# Patient Record
Sex: Male | Born: 1950 | Race: White | Hispanic: No
Health system: Southern US, Academic
[De-identification: ages and names within clinical notes are randomized; demographics above are authoritative.]

## PROBLEM LIST (undated history)

## (undated) ENCOUNTER — Ambulatory Visit: Payer: MEDICARE

## (undated) ENCOUNTER — Ambulatory Visit

## (undated) ENCOUNTER — Encounter
Attending: Student in an Organized Health Care Education/Training Program | Primary: Student in an Organized Health Care Education/Training Program

## (undated) ENCOUNTER — Encounter

## (undated) ENCOUNTER — Encounter: Attending: Geriatric Medicine | Primary: Geriatric Medicine

## (undated) ENCOUNTER — Inpatient Hospital Stay

## (undated) ENCOUNTER — Encounter: Attending: Family Medicine | Primary: Family Medicine

## (undated) ENCOUNTER — Telehealth

## (undated) ENCOUNTER — Encounter
Payer: MEDICARE | Attending: Student in an Organized Health Care Education/Training Program | Primary: Student in an Organized Health Care Education/Training Program

## (undated) ENCOUNTER — Encounter: Attending: Family | Primary: Family

## (undated) DIAGNOSIS — G8929 Other chronic pain: Secondary | ICD-10-CM

## (undated) DIAGNOSIS — J449 Chronic obstructive pulmonary disease, unspecified: Secondary | ICD-10-CM

## (undated) HISTORY — PX: APPENDECTOMY: SHX54

## (undated) HISTORY — PX: OTHER SURGICAL HISTORY: SHX169

## (undated) HISTORY — PX: CERVICAL SPINE SURGERY: SHX589

---

## 2020-06-05 ENCOUNTER — Encounter: Admit: 2020-06-05 | Discharge: 2020-06-06 | Payer: MEDICARE

## 2020-06-15 MED ORDER — PEG-ELECTROLYTE SOLUTION 420 GRAM ORAL SOLUTION
0 refills | 0 days | Status: CP
Start: 2020-06-15 — End: ?

## 2020-06-18 MED FILL — PEG-ELECTROLYTE SOLUTION 420 GRAM ORAL SOLUTION: 1 days supply | Qty: 4000 | Fill #0 | Status: AC

## 2020-06-18 MED FILL — PEG-ELECTROLYTE SOLUTION 420 GRAM ORAL SOLUTION: 1 days supply | Qty: 4000 | Fill #0

## 2020-07-06 ENCOUNTER — Encounter: Admit: 2020-07-06 | Discharge: 2020-07-07 | Payer: MEDICARE

## 2020-07-20 DIAGNOSIS — J441 Chronic obstructive pulmonary disease with (acute) exacerbation: Principal | ICD-10-CM

## 2020-07-23 ENCOUNTER — Encounter
Admit: 2020-07-23 | Discharge: 2020-07-23 | Payer: MEDICARE | Attending: Student in an Organized Health Care Education/Training Program | Primary: Student in an Organized Health Care Education/Training Program

## 2020-07-23 ENCOUNTER — Ambulatory Visit: Admit: 2020-07-23 | Discharge: 2020-07-23 | Payer: MEDICARE

## 2020-07-23 DIAGNOSIS — J441 Chronic obstructive pulmonary disease with (acute) exacerbation: Principal | ICD-10-CM

## 2020-07-23 DIAGNOSIS — R590 Localized enlarged lymph nodes: Principal | ICD-10-CM

## 2020-07-23 MED ORDER — VARENICLINE 0.5 MG TABLET
ORAL_TABLET | ORAL | 0 refills | 0.00000 days | Status: CP
Start: 2020-07-23 — End: ?

## 2020-07-23 MED ORDER — FLUTICASONE FUR. 100 MCG-UMECLID 62.5 MCG-VILANT 25 MCG INHALAT.POWDER
Freq: Every day | RESPIRATORY_TRACT | 11 refills | 28 days | Status: CP
Start: 2020-07-23 — End: ?

## 2020-07-26 MED ORDER — IPRATROPIUM 0.5 MG-ALBUTEROL 3 MG (2.5 MG BASE)/3 ML NEBULIZATION SOLN
Freq: Four times a day (QID) | RESPIRATORY_TRACT | 11 refills | 30.00000 days | Status: CP | PRN
Start: 2020-07-26 — End: 2020-08-25

## 2020-08-01 ENCOUNTER — Encounter: Admit: 2020-08-01 | Discharge: 2020-08-05 | Payer: MEDICARE | Attending: Registered Nurse | Primary: Registered Nurse

## 2020-08-01 ENCOUNTER — Encounter: Admit: 2020-08-01 | Discharge: 2020-08-05 | Payer: MEDICARE

## 2020-09-18 DIAGNOSIS — Z0000 Encounter for general adult medical examination without abnormal findings: Principal | ICD-10-CM

## 2020-09-19 DIAGNOSIS — R590 Localized enlarged lymph nodes: Principal | ICD-10-CM

## 2020-11-15 DIAGNOSIS — Z87891 Personal history of nicotine dependence: Principal | ICD-10-CM

## 2021-01-13 ENCOUNTER — Encounter: Admit: 2021-01-13 | Discharge: 2021-01-14 | Disposition: A | Discharge status: Home / Self Care | Payer: MEDICARE

## 2021-01-13 ENCOUNTER — Emergency Department: Admit: 2021-01-13 | Discharge: 2021-01-14 | Disposition: A | Discharge status: Home / Self Care | Payer: MEDICARE

## 2021-01-13 DIAGNOSIS — R000 Tachycardia, unspecified: Principal | ICD-10-CM

## 2021-01-13 DIAGNOSIS — R42 Dizziness and giddiness: Principal | ICD-10-CM

## 2021-01-13 DIAGNOSIS — J441 Chronic obstructive pulmonary disease with (acute) exacerbation: Principal | ICD-10-CM

## 2021-01-13 DIAGNOSIS — F17210 Nicotine dependence, cigarettes, uncomplicated: Principal | ICD-10-CM

## 2021-01-13 DIAGNOSIS — R0602 Shortness of breath: Principal | ICD-10-CM

## 2021-01-13 DIAGNOSIS — U071 COVID-19: Principal | ICD-10-CM

## 2021-01-13 DIAGNOSIS — Z79899 Other long term (current) drug therapy: Principal | ICD-10-CM

## 2021-01-13 DIAGNOSIS — Z7951 Long term (current) use of inhaled steroids: Principal | ICD-10-CM

## 2021-01-13 DIAGNOSIS — R531 Weakness: Principal | ICD-10-CM

## 2021-01-13 MED ORDER — PREDNISONE 20 MG TABLET
ORAL_TABLET | ORAL | 0 refills | 0.00000 days | Status: CP
Start: 2021-01-13 — End: 2021-01-13

## 2021-04-08 ENCOUNTER — Ambulatory Visit: Admit: 2021-04-08 | Payer: MEDICARE

## 2021-04-10 ENCOUNTER — Emergency Department: Payer: Medicare Other

## 2021-04-10 ENCOUNTER — Emergency Department
Admission: EM | Admit: 2021-04-10 | Discharge: 2021-04-11 | Disposition: A | Discharge status: Home / Self Care | Payer: Medicare Other | Attending: Emergency Medicine | Admitting: Emergency Medicine

## 2021-04-10 DIAGNOSIS — Z20822 Contact with and (suspected) exposure to covid-19: Secondary | ICD-10-CM | POA: Insufficient documentation

## 2021-04-10 DIAGNOSIS — R0902 Hypoxemia: Secondary | ICD-10-CM | POA: Diagnosis not present

## 2021-04-10 DIAGNOSIS — J441 Chronic obstructive pulmonary disease with (acute) exacerbation: Secondary | ICD-10-CM | POA: Diagnosis not present

## 2021-04-10 DIAGNOSIS — R0602 Shortness of breath: Secondary | ICD-10-CM

## 2021-04-10 LAB — CBC WITH DIFFERENTIAL/PLATELET
Abs Immature Granulocytes: 0.07 10*3/uL (ref 0.00–0.07)
Basophils Absolute: 0.1 10*3/uL (ref 0.0–0.1)
Basophils Relative: 0 %
Eosinophils Absolute: 0.1 10*3/uL (ref 0.0–0.5)
Eosinophils Relative: 0 %
HCT: 36.7 % — ABNORMAL LOW (ref 39.0–52.0)
Hemoglobin: 12 g/dL — ABNORMAL LOW (ref 13.0–17.0)
Immature Granulocytes: 0 %
Lymphocytes Relative: 2 %
Lymphs Abs: 0.4 10*3/uL — ABNORMAL LOW (ref 0.7–4.0)
MCH: 28.6 pg (ref 26.0–34.0)
MCHC: 32.7 g/dL (ref 30.0–36.0)
MCV: 87.6 fL (ref 80.0–100.0)
Monocytes Absolute: 0.6 10*3/uL (ref 0.1–1.0)
Monocytes Relative: 4 %
Neutro Abs: 16.4 10*3/uL — ABNORMAL HIGH (ref 1.7–7.7)
Neutrophils Relative %: 94 %
Platelets: 233 10*3/uL (ref 150–400)
RBC: 4.19 MIL/uL — ABNORMAL LOW (ref 4.22–5.81)
RDW: 15.9 % — ABNORMAL HIGH (ref 11.5–15.5)
WBC: 17.6 10*3/uL — ABNORMAL HIGH (ref 4.0–10.5)
nRBC: 0 % (ref 0.0–0.2)

## 2021-04-10 MED ORDER — DOXYCYCLINE HYCLATE 100 MG PO TABS
100.0000 mg | ORAL_TABLET | Freq: Once | ORAL | Status: AC
  Administered 2021-04-11: 100 mg via ORAL
  Filled 2021-04-10: qty 1

## 2021-04-10 MED ORDER — METHYLPREDNISOLONE SODIUM SUCC 125 MG IJ SOLR: 125.0000 mg | Freq: Once | INTRAMUSCULAR | Status: DC

## 2021-04-10 MED ORDER — LACTATED RINGERS IV BOLUS
1000.0000 mL | Freq: Once | INTRAVENOUS | Status: AC
  Administered 2021-04-10: 1000 mL via INTRAVENOUS

## 2021-04-10 MED ORDER — IPRATROPIUM-ALBUTEROL 0.5-2.5 (3) MG/3ML IN SOLN
6.0000 mL | Freq: Once | RESPIRATORY_TRACT | Status: AC
  Administered 2021-04-10: 6 mL via RESPIRATORY_TRACT
  Filled 2021-04-10: qty 3

## 2021-04-10 NOTE — ED Provider Notes (Signed)
[facility] Emergency Department Provider Note ____________________________________________   Event Date/Time   First MD Initiated Contact with Patient 04/10/21 2253     (approximate)  I have reviewed the triage vital signs and the nursing notes.  HISTORY  Chief Complaint Shortness of Breath (SOB x "couple hours"  EMS reports that he told them he had been sob since this afternoon, had taken "a few hits of his inhaler."  125mg  Solumedrol, 5mg  Albuterol inhaler received enroute to ED by EMS.  Patient anxious, EMS states that he didn't become anxious until they gave him albuterol.  )   HPI Frank Calderon is a 70 y.o. malewho presents to the ED for evaluation of SOB  Chart review indicates no hx in our system.  Patient was receiving care through the [facility].  Reports lifelong smoking history for which he has inhalers at home.  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 Lives in [place] and works in a [place].   Patient presents to the ED via EMS from home for evaluation of shortness of breath.  Patient reports increasing shortness of breath over the past few hours since he left work this afternoon. Reports poor sleep last night, but denies this being due to his breathing.  Patient is sleepy, and repetitive with his answers and questions.  Often says "I do not know" and history is limited due to his respiratory status and mentation.  No past medical history on file.  There are no problems to display for this patient.    Prior to Admission medications   Medication Sig Start Date End Date Taking? Authorizing Provider  doxycycline (VIBRA-TABS) 100 MG tablet Take 1 tablet (100 mg total) by mouth 2 (two) times daily for 14 days. 04/11/21 04/25/21 Yes 06/11/21, MD  predniSONE (DELTASONE) 50 MG tablet Take 1 tablet (50 mg total) by mouth daily for 4 days. 04/11/21 04/15/21 Yes 06/11/21, MD    Allergies Patient has no known  allergies.  No family history on file.  Social History    Review of Systems  Difficult to be accurately assessed due to patient's mentation and respiratory status ____________________________________________   PHYSICAL EXAM:  VITAL SIGNS: Vitals:   04/11/21 0304 04/11/21 0346  BP: (!) 115/58 (!) 99/57  Pulse: 89 96  Resp: 15 12  Temp:    SpO2: 95% 97%      Constitutional: Alert and oriented to self, year, location and situation. Pink puffer morphology.  Tripoding and sitting upright with accessory muscle usage. Eyes: Conjunctivae are normal. PERRL. EOMI. Head: Atraumatic. Nose: No congestion/rhinnorhea. Mouth/Throat: Mucous membranes are dry.  Oropharynx non-erythematous. Neck: No stridor. No cervical spine tenderness to palpation. Cardiovascular: Tachycardic rate, regular rhythm. Grossly normal heart sounds.  Good peripheral circulation. Respiratory: Tachypneic to the upper 20s with accessory muscle usage.  Requiring supplemental oxygen via nasal cannula. Gastrointestinal: Soft , nondistended, nontender to palpation. No CVA tenderness. Musculoskeletal: No lower extremity tenderness nor edema.  No joint effusions.  1 cm scabbing lesion to his left lateral upper arm with surrounding induration and erythema consistent with cellulitis.  No fluctuance, laceration or bleeding. Neurologic: No gross focal neurologic deficits are appreciated.  Initially confused, repetitive but improves her mental status standpoint after improvement of his respiratory status. Skin:  Skin is warm, dry.  Erythematous rash with central scabbing lesion to his left upper arm, as noted above Psychiatric: Mood and affect are normal. Speech and behavior are normal.  ____________________________________________   LABS (  all labs ordered are listed, but only abnormal results are displayed)  Labs Reviewed  COMPREHENSIVE METABOLIC PANEL - Abnormal; Notable for the following components:      Result Value    Sodium 134 (*)    Total Bilirubin 1.3 (*)    All other components within normal limits  CBC WITH DIFFERENTIAL/PLATELET - Abnormal; Notable for the following components:   WBC 17.6 (*)    RBC 4.19 (*)    Hemoglobin 12.0 (*)    HCT 36.7 (*)    RDW 15.9 (*)    Neutro Abs 16.4 (*)    Lymphs Abs 0.4 (*)    All other components within normal limits  BLOOD GAS, ARTERIAL - Abnormal; Notable for the following components:   pH, Arterial 7.59 (*)    pCO2 arterial 22 (*)    pO2, Arterial 127 (*)    All other components within normal limits  TROPONIN I (HIGH SENSITIVITY) - Abnormal; Notable for the following components:   Troponin I (High Sensitivity) 20 (*)    All other components within normal limits  TROPONIN I (HIGH SENSITIVITY) - Abnormal; Notable for the following components:   Troponin I (High Sensitivity) 31 (*)    All other components within normal limits  TROPONIN I (HIGH SENSITIVITY) - Abnormal; Notable for the following components:   Troponin I (High Sensitivity) 32 (*)    All other components within normal limits  RESP PANEL BY RT-PCR (FLU A&B, COVID) ARPGX2  MAGNESIUM  LACTIC ACID, PLASMA  BRAIN NATRIURETIC PEPTIDE   ____________________________________________  12 Lead EKG  Sinus tachycardia with sinus arrhythmia, rate of 122 bpm.  Normal axis and intervals.  No evidence of acute ischemia. ____________________________________________  RADIOLOGY  ED MD interpretation: 1 view CXR reviewed by me with increased perihilar infiltrate/opacities  Official radiology report(s): DG Chest Portable 1 View  Result Date: 04/10/2021 CLINICAL DATA:  Shortness of breath. EXAM: PORTABLE CHEST 1 VIEW COMPARISON:  None. FINDINGS: The lungs are mildly hyperinflated. Moderate severity emphysematous lung disease is seen. Mild to moderate severity areas of atelectasis and/or early infiltrate are also seen within the bilateral lung bases. There is no evidence of a pleural effusion or  pneumothorax. The heart size and mediastinal contours are within normal limits. Degenerative changes seen throughout the thoracic spine. IMPRESSION: COPD with mild to moderate severity bibasilar atelectasis and/or early infiltrate. Electronically Signed   By: Aram Candela M.D.   On: 04/10/2021 23:27    ____________________________________________   PROCEDURES and INTERVENTIONS  Procedure(s) performed (including Critical Care):  .1-3 Lead EKG Interpretation Performed by: Delton Prairie, MD Authorized by: Delton Prairie, MD     Interpretation: abnormal     ECG rate:  120   ECG rate assessment: tachycardic     Rhythm: sinus tachycardia     Ectopy: none     Conduction: normal   .Critical Care Performed by: Delton Prairie, MD Authorized by: Delton Prairie, MD   Critical care provider statement:    Critical care time (minutes):  35   Critical care was necessary to treat or prevent imminent or life-threatening deterioration of the following conditions:  Respiratory failure   Critical care was time spent personally by me on the following activities:  Discussions with consultants, evaluation of patient's response to treatment, examination of patient, ordering and performing treatments and interventions, ordering and review of laboratory studies, ordering and review of radiographic studies, pulse oximetry, re-evaluation of patient's condition, obtaining history from patient or surrogate and review of old  charts    Medications  ipratropium-albuterol (DUONEB) 0.5-2.5 (3) MG/3ML nebulizer solution 6 mL (6 mLs Nebulization Given 04/10/21 2322)  lactated ringers bolus 1,000 mL (0 mLs Intravenous Stopped 04/11/21 0051)  doxycycline (VIBRA-TABS) tablet 100 mg (100 mg Oral Given 04/11/21 0036)    ____________________________________________   MDM / ED COURSE   70 year old male with history of COPD presents to the ED short of breath with evidence of exacerbation, initially hypoxic, but ultimately  amenable to outpatient management.  Presents tachycardic, tachypneic and with sats in the upper 80s tripoding with accessory respiratory muscle usage.  He does have a low-grade temperature, reports increased sputum production and has mild perihilar infiltrates on his CXR, concerning for infectious etiology of his symptoms.  Furthermore has what he describes with a tick bite on his left arm with some mild cellulitis around this, but no clear target lesion.  Patient was started on a 14-day course of doxycycline to treat both of these.  Provided breathing treatments and he got steroids prehospital with significant improvement of his clinical picture and normalization of his vital signs.  His EKG is nonischemic and troponins rise slightly on first interval, and become flat.  He has no chest pain and no evidence of ACS.  Patient provided prescriptions prednisone and doxycycline, and reports having his inhalers at home.  We discussed return precautions for the ED prior to discharge   Clinical Course as of 04/11/21 0404  Wed Apr 10, 2021  2342 Reassessed.  Breathing treatment ongoing.  Patient was clinically improved.  Laying back in bed.  Tachycardia improving.  He is more lucid now during our conversation.  Reports a tick bite to his left arm at the site of his erythematous rash.  Reports feeling fevers today.  Reports increased sputum production [DS]  Thu Apr 11, 2021  0144 Reassessed.  Patient reports feeling much better.  We discussed outpatient management with steroids and doxycycline course.  We discussed return precautions for the ED. [DS]  0144 Patient remains off oxygen with normal saturations on room air, no longer tachycardic.  Clinically much improved [DS]  0301 Troponin slightly uptrending despite reassuring clinical picture and lack of chest pain.  Discussed with the nurse and we will obtain a third set to acquire better trend. [DS]  0359 Reassessed.  Patient sleeping comfortably with normal vital  signs on room air.  We discussed his troponin results.  He continues to deny any chest pain.  He is requesting discharge and I think this is reasonable.  We discussed return precautions for the ED and adherence to doxycycline and prednisone course. [DS]    Clinical Course User Index [DS] Delton Prairie, MD    ____________________________________________   FINAL CLINICAL IMPRESSION(S) / ED DIAGNOSES  Final diagnoses:  COPD exacerbation (HCC)  Shortness of breath  Hypoxia     ED Discharge Orders         Ordered    predniSONE (DELTASONE) 50 MG tablet  Daily        04/11/21 0401    doxycycline (VIBRA-TABS) 100 MG tablet  2 times daily        04/11/21 0401           Prabhjot Piscitello Katrinka Blazing   Note:  This document was prepared using Dragon voice recognition software and may include unintentional dictation errors.   Delton Prairie, MD 04/11/21 [phone]

## 2021-04-10 NOTE — ED Triage Notes (Signed)
Patient reports being SOB for a couple hours.  Received 125 Solumedrol and 5mg  Albuterol 5mg  nebs treatment enroute.  91% RA upon arrival, placed on 2L [place], improved to 96%.  EKG obtained.

## 2021-04-11 ENCOUNTER — Other Ambulatory Visit: Payer: Self-pay

## 2021-04-11 DIAGNOSIS — J441 Chronic obstructive pulmonary disease with (acute) exacerbation: Secondary | ICD-10-CM | POA: Diagnosis not present

## 2021-04-11 LAB — BLOOD GAS, ARTERIAL
Acid-Base Excess: 1.2 mmol/L (ref 0.0–2.0)
Bicarbonate: 21.1 mmol/L (ref 20.0–28.0)
O2 Saturation: 99.3 %
Patient temperature: 37
pCO2 arterial: 22 mmHg — ABNORMAL LOW (ref 32.0–48.0)
pH, Arterial: 7.59 — ABNORMAL HIGH (ref 7.350–7.450)
pO2, Arterial: 127 mmHg — ABNORMAL HIGH (ref 83.0–108.0)

## 2021-04-11 LAB — COMPREHENSIVE METABOLIC PANEL
ALT: 11 U/L (ref 0–44)
AST: 21 U/L (ref 15–41)
Albumin: 3.7 g/dL (ref 3.5–5.0)
Alkaline Phosphatase: 74 U/L (ref 38–126)
Anion gap: 12 (ref 5–15)
BUN: 15 mg/dL (ref 8–23)
CO2: 23 mmol/L (ref 22–32)
Calcium: 8.9 mg/dL (ref 8.9–10.3)
Chloride: 99 mmol/L (ref 98–111)
Creatinine, Ser: 0.69 mg/dL (ref 0.61–1.24)
GFR, Estimated: >60 mL/min (ref >60)
Glucose, Bld: 96 mg/dL (ref 70–99)
Potassium: 3.8 mmol/L (ref 3.5–5.1)
Sodium: 134 mmol/L — ABNORMAL LOW (ref 135–145)
Total Bilirubin: 1.3 mg/dL — ABNORMAL HIGH (ref 0.3–1.2)
Total Protein: 8.1 g/dL (ref 6.5–8.1)

## 2021-04-11 LAB — TROPONIN I (HIGH SENSITIVITY)
Troponin I (High Sensitivity): 20 ng/L — ABNORMAL HIGH (ref <18)
Troponin I (High Sensitivity): 31 ng/L — ABNORMAL HIGH (ref <18)
Troponin I (High Sensitivity): 32 ng/L — ABNORMAL HIGH (ref <18)

## 2021-04-11 LAB — LACTIC ACID, PLASMA: Lactic Acid, Venous: 1.5 mmol/L (ref 0.5–1.9)

## 2021-04-11 LAB — RESP PANEL BY RT-PCR (FLU A&B, COVID) ARPGX2
Influenza A by PCR: NEGATIVE
Influenza B by PCR: NEGATIVE
SARS Coronavirus 2 by RT PCR: NEGATIVE

## 2021-04-11 LAB — MAGNESIUM: Magnesium: 1.8 mg/dL (ref 1.7–2.4)

## 2021-04-11 LAB — BRAIN NATRIURETIC PEPTIDE: B Natriuretic Peptide: 75.6 pg/mL (ref 0.0–100.0)

## 2021-04-11 MED ORDER — PREDNISONE 50 MG PO TABS: 50.0000 mg | ORAL_TABLET | Freq: Every day | ORAL | 0 refills | Status: AC

## 2021-04-11 MED ORDER — DOXYCYCLINE HYCLATE 100 MG PO TABS: 100.0000 mg | ORAL_TABLET | Freq: Two times a day (BID) | ORAL | 0 refills | Status: DC

## 2021-04-11 NOTE — ED Notes (Signed)
Hemodialysis completed, pt aox4 breathing remains regular and unlabored. Remain awaiting order clarification from MD [facility] regarding pt HR

## 2021-04-11 NOTE — ED Notes (Signed)
DC reviewed by RN with pt and son in law. Denies questions. Pt ambulated to son in law's car and denies complaints. AO x4, breathing regular and unlabored

## 2021-04-11 NOTE — ED Notes (Signed)
Pt medicated per MAR. WOB decreased, resting comfortably in bed with eyes closed. Awakens to RN light touch to shoulder. Denies concerns at this time. Aox4. Lights dimmed for pt comfort. Urinal placed at bedside

## 2021-04-11 NOTE — ED Notes (Signed)
Assumed care of pt at 2300 - late entry. Pt alert and oriented x4 however easily startled which pt states is not his baseline. Reports he is tired, intermittently requiring extra prompting to answer questions when pt closes his eyes. Family was at bedside. Tick bite reported by to RUE, redness and warmth noted. Breathing is regular however pt had shoulders raised while breathing upon arrival which has since settled.

## 2021-04-11 NOTE — ED Notes (Signed)
Pt up to use urinal ad lib, denies complaints and reports he feels better than he did on arrival. AOx4, breathing regular and unlabored

## 2021-04-11 NOTE — Discharge Instructions (Signed)
As we discussed, you are being discharged with 2 prescriptions. Prednisone steroids to take once daily to help relieve inflammation of your lungs.  Doxycycline antibiotic to take 2 times daily for the next 14 days to treat mild infection in your lungs, as well as the tick bite rash/infection to your left arm. Please finish all 14 days of this medication.  Continue your regular inhalers and other medications.  Return to the ED with any further worsening symptoms despite these medications.

## 2021-04-17 ENCOUNTER — Encounter: Payer: Self-pay | Admitting: Internal Medicine

## 2021-04-17 ENCOUNTER — Other Ambulatory Visit: Payer: Self-pay

## 2021-04-17 ENCOUNTER — Inpatient Hospital Stay
Admission: EM | Admit: 2021-04-17 | Discharge: 2021-04-21 | DRG: 871 | Disposition: A | Discharge status: Home Health | Payer: Medicare Other | Attending: Family Medicine | Admitting: Family Medicine

## 2021-04-17 ENCOUNTER — Emergency Department: Payer: Medicare Other

## 2021-04-17 DIAGNOSIS — R0902 Hypoxemia: Secondary | ICD-10-CM

## 2021-04-17 DIAGNOSIS — R590 Localized enlarged lymph nodes: Secondary | ICD-10-CM | POA: Diagnosis present

## 2021-04-17 DIAGNOSIS — Z7951 Long term (current) use of inhaled steroids: Secondary | ICD-10-CM | POA: Diagnosis not present

## 2021-04-17 DIAGNOSIS — F17200 Nicotine dependence, unspecified, uncomplicated: Secondary | ICD-10-CM | POA: Diagnosis present

## 2021-04-17 DIAGNOSIS — J9601 Acute respiratory failure with hypoxia: Secondary | ICD-10-CM | POA: Diagnosis present

## 2021-04-17 DIAGNOSIS — T380X5A Adverse effect of glucocorticoids and synthetic analogues, initial encounter: Secondary | ICD-10-CM | POA: Diagnosis present

## 2021-04-17 DIAGNOSIS — R0602 Shortness of breath: Secondary | ICD-10-CM

## 2021-04-17 DIAGNOSIS — J189 Pneumonia, unspecified organism: Secondary | ICD-10-CM | POA: Diagnosis present

## 2021-04-17 DIAGNOSIS — J440 Chronic obstructive pulmonary disease with acute lower respiratory infection: Secondary | ICD-10-CM | POA: Diagnosis present

## 2021-04-17 DIAGNOSIS — Z8249 Family history of ischemic heart disease and other diseases of the circulatory system: Secondary | ICD-10-CM | POA: Diagnosis not present

## 2021-04-17 DIAGNOSIS — R000 Tachycardia, unspecified: Secondary | ICD-10-CM | POA: Diagnosis present

## 2021-04-17 DIAGNOSIS — Z20822 Contact with and (suspected) exposure to covid-19: Secondary | ICD-10-CM | POA: Diagnosis present

## 2021-04-17 DIAGNOSIS — J441 Chronic obstructive pulmonary disease with (acute) exacerbation: Secondary | ICD-10-CM | POA: Diagnosis present

## 2021-04-17 DIAGNOSIS — Z7982 Long term (current) use of aspirin: Secondary | ICD-10-CM

## 2021-04-17 DIAGNOSIS — J181 Lobar pneumonia, unspecified organism: Secondary | ICD-10-CM | POA: Diagnosis present

## 2021-04-17 DIAGNOSIS — Z79899 Other long term (current) drug therapy: Secondary | ICD-10-CM | POA: Diagnosis not present

## 2021-04-17 DIAGNOSIS — A419 Sepsis, unspecified organism: Secondary | ICD-10-CM | POA: Diagnosis not present

## 2021-04-17 DIAGNOSIS — E876 Hypokalemia: Secondary | ICD-10-CM | POA: Diagnosis present

## 2021-04-17 DIAGNOSIS — J9600 Acute respiratory failure, unspecified whether with hypoxia or hypercapnia: Secondary | ICD-10-CM | POA: Diagnosis not present

## 2021-04-17 HISTORY — DX: Chronic obstructive pulmonary disease, unspecified: J44.9

## 2021-04-17 LAB — CBC WITH DIFFERENTIAL/PLATELET
Abs Immature Granulocytes: 0.34 10*3/uL — ABNORMAL HIGH (ref 0.00–0.07)
Basophils Absolute: 0.1 10*3/uL (ref 0.0–0.1)
Basophils Relative: 0 %
Eosinophils Absolute: 0 10*3/uL (ref 0.0–0.5)
Eosinophils Relative: 0 %
HCT: 39.6 % (ref 39.0–52.0)
Hemoglobin: 12.9 g/dL — ABNORMAL LOW (ref 13.0–17.0)
Immature Granulocytes: 1 %
Lymphocytes Relative: 4 %
Lymphs Abs: 1 10*3/uL (ref 0.7–4.0)
MCH: 27.9 pg (ref 26.0–34.0)
MCHC: 32.6 g/dL (ref 30.0–36.0)
MCV: 85.5 fL (ref 80.0–100.0)
Monocytes Absolute: 1.3 10*3/uL — ABNORMAL HIGH (ref 0.1–1.0)
Monocytes Relative: 5 %
Neutro Abs: 21.8 10*3/uL — ABNORMAL HIGH (ref 1.7–7.7)
Neutrophils Relative %: 90 %
Platelets: 283 10*3/uL (ref 150–400)
RBC: 4.63 MIL/uL (ref 4.22–5.81)
RDW: 15.8 % — ABNORMAL HIGH (ref 11.5–15.5)
WBC: 24.5 10*3/uL — ABNORMAL HIGH (ref 4.0–10.5)
nRBC: 0 % (ref 0.0–0.2)

## 2021-04-17 LAB — LACTIC ACID, PLASMA
Lactic Acid, Venous: 1.3 mmol/L (ref 0.5–1.9)
Lactic Acid, Venous: 1.6 mmol/L (ref 0.5–1.9)

## 2021-04-17 LAB — COMPREHENSIVE METABOLIC PANEL
ALT: 32 U/L (ref 0–44)
AST: 23 U/L (ref 15–41)
Albumin: 2.8 g/dL — ABNORMAL LOW (ref 3.5–5.0)
Alkaline Phosphatase: 126 U/L (ref 38–126)
Anion gap: 12 (ref 5–15)
BUN: 25 mg/dL — ABNORMAL HIGH (ref 8–23)
CO2: 26 mmol/L (ref 22–32)
Calcium: 8.6 mg/dL — ABNORMAL LOW (ref 8.9–10.3)
Chloride: 97 mmol/L — ABNORMAL LOW (ref 98–111)
Creatinine, Ser: 0.52 mg/dL — ABNORMAL LOW (ref 0.61–1.24)
GFR, Estimated: >60 mL/min (ref >60)
Glucose, Bld: 85 mg/dL (ref 70–99)
Potassium: 3.8 mmol/L (ref 3.5–5.1)
Sodium: 135 mmol/L (ref 135–145)
Total Bilirubin: 1.4 mg/dL — ABNORMAL HIGH (ref 0.3–1.2)
Total Protein: 7.4 g/dL (ref 6.5–8.1)

## 2021-04-17 LAB — BLOOD GAS, VENOUS
Acid-Base Excess: 2.7 mmol/L — ABNORMAL HIGH (ref 0.0–2.0)
Bicarbonate: 27.9 mmol/L (ref 20.0–28.0)
O2 Saturation: 76.8 %
Patient temperature: 37
pCO2, Ven: 44 mmHg (ref 44.0–60.0)
pH, Ven: 7.41 (ref 7.250–7.430)
pO2, Ven: 41 mmHg (ref 32.0–45.0)

## 2021-04-17 LAB — BRAIN NATRIURETIC PEPTIDE: B Natriuretic Peptide: 83.1 pg/mL (ref 0.0–100.0)

## 2021-04-17 LAB — RESP PANEL BY RT-PCR (FLU A&B, COVID) ARPGX2
Influenza A by PCR: NEGATIVE
Influenza B by PCR: NEGATIVE
SARS Coronavirus 2 by RT PCR: NEGATIVE

## 2021-04-17 LAB — TROPONIN I (HIGH SENSITIVITY)
Troponin I (High Sensitivity): 7 ng/L (ref <18)
Troponin I (High Sensitivity): 10 ng/L (ref <18)

## 2021-04-17 LAB — HIV ANTIBODY (ROUTINE TESTING W REFLEX): HIV Screen 4th Generation wRfx: NONREACTIVE

## 2021-04-17 MED ORDER — LACTATED RINGERS IV BOLUS (SEPSIS)
1000.0000 mL | Freq: Once | INTRAVENOUS | Status: AC
  Administered 2021-04-17: 1000 mL via INTRAVENOUS

## 2021-04-17 MED ORDER — VANCOMYCIN HCL IN DEXTROSE 1-5 GM/200ML-% IV SOLN: 1000.0000 mg | Freq: Once | INTRAVENOUS | Status: DC

## 2021-04-17 MED ORDER — SODIUM CHLORIDE 0.45 % IV SOLN: INTRAVENOUS | Status: AC

## 2021-04-17 MED ORDER — ACETAMINOPHEN 325 MG PO TABS
650.0000 mg | ORAL_TABLET | Freq: Four times a day (QID) | ORAL | Status: DC | PRN
  Administered 2021-04-17: 650 mg via ORAL
  Filled 2021-04-17: qty 2

## 2021-04-17 MED ORDER — GUAIFENESIN ER 600 MG PO TB12
1200.0000 mg | ORAL_TABLET | Freq: Two times a day (BID) | ORAL | Status: DC
  Administered 2021-04-17 – 2021-04-21 (×9): 1200 mg via ORAL
  Filled 2021-04-17 (×9): qty 2

## 2021-04-17 MED ORDER — ENOXAPARIN SODIUM 40 MG/0.4ML IJ SOSY
40.0000 mg | PREFILLED_SYRINGE | INTRAMUSCULAR | Status: DC
  Administered 2021-04-17 – 2021-04-21 (×5): 40 mg via SUBCUTANEOUS
  Filled 2021-04-17 (×5): qty 0.4

## 2021-04-17 MED ORDER — IPRATROPIUM-ALBUTEROL 0.5-2.5 (3) MG/3ML IN SOLN: 3.0000 mL | Freq: Four times a day (QID) | RESPIRATORY_TRACT | Status: DC | PRN

## 2021-04-17 MED ORDER — SODIUM CHLORIDE 0.9 % IV SOLN: 2.0000 g | Freq: Once | INTRAVENOUS | Status: DC

## 2021-04-17 MED ORDER — SODIUM CHLORIDE 0.9 % IV SOLN
2.0000 g | INTRAVENOUS | Status: DC
  Administered 2021-04-17 – 2021-04-19 (×3): 2 g via INTRAVENOUS
  Filled 2021-04-17 (×2): qty 2
  Filled 2021-04-17 (×2): qty 20

## 2021-04-17 MED ORDER — SODIUM CHLORIDE 0.9 % IV SOLN
500.0000 mg | INTRAVENOUS | Status: DC
  Administered 2021-04-17 – 2021-04-19 (×3): 500 mg via INTRAVENOUS
  Filled 2021-04-17 (×4): qty 500

## 2021-04-17 MED ORDER — OXYCODONE HCL 5 MG PO TABS: 5.0000 mg | ORAL_TABLET | ORAL | Status: DC | PRN

## 2021-04-17 MED ORDER — MOMETASONE FURO-FORMOTEROL FUM 200-5 MCG/ACT IN AERO
2.0000 | INHALATION_SPRAY | Freq: Two times a day (BID) | RESPIRATORY_TRACT | Status: DC
  Administered 2021-04-17 – 2021-04-21 (×9): 2 via RESPIRATORY_TRACT
  Filled 2021-04-17 (×2): qty 8.8

## 2021-04-17 MED ORDER — LACTATED RINGERS IV SOLN: INTRAVENOUS | Status: AC

## 2021-04-17 NOTE — ED Provider Notes (Signed)
[facility] Emergency Department Provider Note   ____________________________________________   Event Date/Time   First MD Initiated Contact with Patient 04/17/21 [phone]     (approximate)  I have reviewed the triage vital signs and the nursing notes.   HISTORY  Chief Complaint Shortness of Breath (Respiratory distress from home , hx COPD)    HPI Frank Calderon is a 70 y.o. male with a stated past medical history of COPD who presents via EMS from home complaining of shortness of breath.  Patient states that he has been using his home albuterol nebulizer treatments with little effect.  EMS gave patient 2 albuterol/1 DuoNeb/125 IM Solu-Medrol with minimal relief in patient's symptoms.  Patient states that he was seen here for similar symptoms about a week ago and placed on doxycycline and prednisone.  Patient states that he feels he never truly "got all the way better" but slightly improved while taking the steroid medications.  Patient states that once they ended on Sunday he is become worse and worse.  Patient endorses dyspnea on exertion and denies any other exacerbating or relieving factors.  Patient does endorse chest tightness.  Patient currently denies any vision changes, tinnitus, difficulty speaking, facial droop, sore throat, abdominal pain, nausea/vomiting/diarrhea, dysuria, or weakness/numbness/paresthesias in any extremity         No past medical history on file.  There are no problems to display for this patient.     Prior to Admission medications   Medication Sig Start Date End Date Taking? Authorizing Provider  doxycycline (VIBRA-TABS) 100 MG tablet Take 1 tablet (100 mg total) by mouth 2 (two) times daily for 14 days. 04/11/21 04/25/21  Delton Prairie, MD    Allergies Patient has no known allergies.  No family history on file.  Social History    Review of Systems Constitutional: No fever/chills Eyes: No visual changes. ENT: No sore  throat. Cardiovascular: Endorses chest pain. Respiratory: Endorses shortness of breath. Gastrointestinal: No abdominal pain.  No nausea, no vomiting.  No diarrhea. Genitourinary: Negative for dysuria. Musculoskeletal: Negative for acute arthralgias Skin: Negative for rash. Neurological: Negative for headaches, weakness/numbness/paresthesias in any extremity Psychiatric: Negative for suicidal ideation/homicidal ideation   ____________________________________________   PHYSICAL EXAM:  VITAL SIGNS: ED Triage Vitals  Enc Vitals Group     BP 04/17/21 0933 (!) 118/100     Pulse Rate 04/17/21 0933 (!) 114     Resp 04/17/21 0933 16     Temp 04/17/21 0933 99.3 F (37.4 C)     Temp Source 04/17/21 0933 Oral     SpO2 04/17/21 0932 97 %     Weight 04/17/21 0934 136 lb (61.7 kg)     Height 04/17/21 0934 5\' 10"  (1.778 m)     Head Circumference --      Peak Flow --      Pain Score 04/17/21 0934 0     Pain Loc --      Pain Edu? --      Excl. in GC? --    Constitutional: Alert and oriented. Well appearing and in no acute distress. Eyes: Conjunctivae are normal. PERRL. Head: Atraumatic. Nose: No congestion/rhinnorhea. Mouth/Throat: Mucous membranes are moist. Neck: No stridor Cardiovascular: Grossly normal heart sounds.  Good peripheral circulation. Respiratory: Inspiratory and expiratory wheezes over bilateral lung fields with accompanying rhonchi.  Increased respiratory effort.  No retractions. Gastrointestinal: Soft and nontender. No distention. Musculoskeletal: No obvious deformities Neurologic:  Normal speech and language. No gross focal neurologic deficits  are appreciated. Skin:  Skin is warm and dry. No rash noted. Psychiatric: Mood and affect are normal. Speech and behavior are normal.  ____________________________________________   LABS (all labs ordered are listed, but only abnormal results are displayed)  Labs Reviewed  COMPREHENSIVE METABOLIC PANEL - Abnormal; Notable  for the following components:      Result Value   Chloride 97 (*)    BUN 25 (*)    Creatinine, Ser 0.52 (*)    Calcium 8.6 (*)    Albumin 2.8 (*)    Total Bilirubin 1.4 (*)    All other components within normal limits  CBC WITH DIFFERENTIAL/PLATELET - Abnormal; Notable for the following components:   WBC 24.5 (*)    Hemoglobin 12.9 (*)    RDW 15.8 (*)    Neutro Abs 21.8 (*)    Monocytes Absolute 1.3 (*)    Abs Immature Granulocytes 0.34 (*)    All other components within normal limits  BLOOD GAS, VENOUS - Abnormal; Notable for the following components:   Acid-Base Excess 2.7 (*)    All other components within normal limits  CULTURE, BLOOD (SINGLE)  BRAIN NATRIURETIC PEPTIDE  TROPONIN I (HIGH SENSITIVITY)   ____________________________________________  EKG  ED ECG REPORT I, Merwyn Katos, the attending physician, personally viewed and interpreted this ECG.  Date: 04/17/2021 EKG Time: 0949 Rate: 115 Rhythm: Tachycardic sinus rhythm QRS Axis: normal Intervals: normal ST/T Wave abnormalities: normal Narrative Interpretation: no evidence of acute ischemia  ____________________________________________  RADIOLOGY  ED MD interpretation: Single view portable x-ray of the chest shows changes consistent with COPD as well as a new left basilar consolidation consistent with pneumonia  Official radiology report(s): DG Chest Port 1 View  Result Date: 04/17/2021 CLINICAL DATA:  Shortness of breath, cough productive of green sputum, history COPD EXAM: PORTABLE CHEST 1 VIEW COMPARISON:  Portable exam 0948 hours compared to 04/10/2021 FINDINGS: Normal heart size, mediastinal contours, and pulmonary vascularity. Atherosclerotic calcification aorta. Emphysematous and bronchitic changes consistent with COPD. New LEFT basilar consolidation consistent with pneumonia. Minimal chronic atelectasis at RIGHT base. No pleural effusion or pneumothorax. Bones demineralized. IMPRESSION: COPD changes  with new LEFT basilar consolidation consistent with pneumonia . Electronically Signed   By: Ulyses Southward M.D.   On: 04/17/2021 10:16    ____________________________________________   PROCEDURES  Procedure(s) performed (including Critical Care):  .1-3 Lead EKG Interpretation Performed by: Merwyn Katos, MD Authorized by: Merwyn Katos, MD     Interpretation: abnormal     ECG rate:  110   ECG rate assessment: tachycardic     Rhythm: sinus tachycardia     Ectopy: none     Conduction: normal   .Critical Care Performed by: Merwyn Katos, MD Authorized by: Merwyn Katos, MD   Critical care provider statement:    Critical care time (minutes):  37   Critical care time was exclusive of:  Separately billable procedures and treating other patients   Critical care was necessary to treat or prevent imminent or life-threatening deterioration of the following conditions:  Respiratory failure   Critical care was time spent personally by me on the following activities:  Discussions with consultants, evaluation of patient's response to treatment, examination of patient, ordering and performing treatments and interventions, ordering and review of laboratory studies, ordering and review of radiographic studies, pulse oximetry, re-evaluation of patient's condition, obtaining history from patient or surrogate and review of old charts   I assumed direction of critical care for  this patient from another provider in my specialty: no     Care discussed with: admitting provider       ____________________________________________   INITIAL IMPRESSION / ASSESSMENT AND PLAN / ED COURSE  As part of my medical decision making, I reviewed the following data within the electronic MEDICAL RECORD NUMBER Nursing notes reviewed and incorporated, Labs reviewed, EKG interpreted, Old chart reviewed, Radiograph reviewed and Notes from prior ED visits reviewed and incorporated      Presents with shortness of  breath, cough, and malaise concerning for pneumonia.  DDx: PE, COPD exacerbation, Pneumothorax, TB, Atypical ACS, Esophageal Rupture, Toxic Exposure, Foreign Body Airway Obstruction.  Workup: CXR CBC, CMP, lactate, troponin  Given History, Exam, and Workup presentation most consistent with pneumonia.  Findings: Left basilar consolidation consistent with pneumonia seen on chest x-ray  Tx: Cefepime Vancomycin  1032 Reassessment: As patient is continuing to require supplemental oxygenation for acute hypoxic respiratory failure, patient will require admission to the internal medicine service for further evaluation and management  Disposition: Admit      ____________________________________________   FINAL CLINICAL IMPRESSION(S) / ED DIAGNOSES  Final diagnoses:  SOB (shortness of breath)  HCAP (healthcare-associated pneumonia)  Hypoxia     ED Discharge Orders    None       Note:  This document was prepared using Dragon voice recognition software and may include unintentional dictation errors.   Merwyn Katos, MD 04/17/21 [phone]

## 2021-04-17 NOTE — ED Notes (Signed)
Message sent to floor  Nurse  

## 2021-04-17 NOTE — ED Notes (Signed)
Transported requested  

## 2021-04-17 NOTE — Sepsis Progress Note (Signed)
Notified provider of need to order lactic acid. ° °

## 2021-04-17 NOTE — Sepsis Progress Note (Signed)
elink is following this code sepsis 

## 2021-04-17 NOTE — H&P (Addendum)
History and Physical    Frank Calderon HQI:[id number] DOB: 1951-05-09 DOA: 04/17/2021  PCP: [facility]   Patient coming from: Home  I have personally briefly reviewed patient's old medical records in [facility]  Chief Complaint: Shortness of breath  HPI: Frank Calderon is a 70 y.o. male with medical history significant for COPD who presents to the ER for evaluation of shortness of breath.  Patient states that he has had symptoms for about 7 days and was recently seen in the emergency room for evaluation of shortness of breath.  He was treated for acute COPD exacerbation and was discharged home on prednisone and doxycycline.  Patient states that his symptoms initially improved but then got worse over the last couple of days. He complains of shortness of breath at rest associated with a cough productive of greenish colored phlegm as well as left-sided pleuritic chest pain. When EMS arrived he was found to have room air pulse of 85% and was placed on 3 L of oxygen via nasal cannula. He denies having any fever or chills, no sick contacts, no headache, no dizziness, no lightheadedness, no blurred vision, no palpitations, no diaphoresis, no abdominal pain, no nausea, no vomiting, no urinary symptoms, no lower extremity swelling. Venous blood gas 7.4 1/44/41/20 7.9/76.8 Sodium 135, potassium 3.8, chloride 97, bicarb 26, glucose 85, BUN 25, creatinine 0.52, calcium 8.6, alkaline phosphatase 126, albumin 2.8, AST 23, ALT 32, total protein 7.4, total bilirubin 1.4, BNP 83.1, white count 24.5, hemoglobin 12.9, hematocrit 39.6, MCV 85.5, RDW 15.8, platelet count 283 Chest x-ray reviewed by me shows COPD changes with consolidation in the left lower lobe consistent with pneumonia . Respiratory viral panel is pending Twelve-lead EKG reviewed by me shows sinus tachycardia with right axis deviation.   ED Course: Patient is a 70 year old Caucasian male with a history of COPD who  presents to the ER via EMS for evaluation of shortness of breath associated with cough productive of greenish phlegm and left-sided pleuritic chest pain.  Patient was hypoxic in the field with room air pulse oximetry of 85% and is currently on 3 L of oxygen via nasal cannula. Chest x-ray shows a left lower lobe infiltrate.  Patient had failed outpatient treatment with doxycycline and will be admitted to the hospital for further evaluation.  Review of Systems: As per HPI otherwise all other systems reviewed and negative.    Past Medical History:  Diagnosis Date  . COPD (chronic obstructive pulmonary disease) (HCC)       reports that he has quit smoking. He does not have any smokeless tobacco history on file. He reports previous alcohol use. No history on file for drug use.  No Known Allergies  Family History  Problem Relation Age of Onset  . Hypertension Mother       Prior to Admission medications   Medication Sig Start Date End Date Taking? Authorizing Provider  albuterol (VENTOLIN HFA) 108 (90 Base) MCG/ACT inhaler Inhale 2 puffs into the lungs every 6 (six) hours as needed. 07/09/20  Yes [provider]  aspirin 325 MG tablet Take 325 mg by mouth daily as needed. 08/02/19  Yes [provider]  buprenorphine-naloxone (SUBOXONE) 8-2 mg SUBL SL tablet Place 1 tablet under the tongue daily. 04/12/21  Yes [provider]  celecoxib (CELEBREX) 200 MG capsule Take 200 mg by mouth 2 (two) times daily. 12/31/20  Yes [provider]  doxycycline (VIBRA-TABS) 100 MG tablet Take 1 tablet (100 mg total)  by mouth 2 (two) times daily for 14 days. 04/11/21 04/25/21 Yes Delton Prairie, MD  doxycycline (VIBRAMYCIN) 100 MG capsule Take 100 mg by mouth 2 (two) times daily. 04/11/21  Yes [provider]  fluticasone-salmeterol (ADVAIR) 250-50 MCG/ACT AEPB 1 puff in the morning and at bedtime. 07/22/20  Yes [provider]  gabapentin (NEURONTIN) 600 MG tablet Take  600 mg by mouth 3 (three) times daily. 04/08/21  Yes [provider]  ipratropium-albuterol (DUONEB) 0.5-2.5 (3) MG/3ML SOLN Inhale 3 mLs into the lungs every 6 (six) hours as needed. 07/26/20  Yes [provider]  predniSONE (DELTASONE) 20 MG tablet Take 20 mg by mouth. Dose pak 01/14/21  Yes [provider]  TRELEGY ELLIPTA 100-62.5-25 MCG/INH AEPB Inhale 1 puff into the lungs daily. 10/26/20  Yes [provider]    Physical Exam: Vitals:   04/17/21 0932 04/17/21 0933 04/17/21 0934  BP:  (!) 118/100   Pulse:  (!) 114   Resp:  16   Temp:  99.3 F (37.4 C)   TempSrc:  Oral   SpO2: 97% 97%   Weight:   61.7 kg  Height:   5\' 10"  (1.778 m)     Vitals:   04/17/21 0932 04/17/21 0933 04/17/21 0934  BP:  (!) 118/100   Pulse:  (!) 114   Resp:  16   Temp:  99.3 F (37.4 C)   TempSrc:  Oral   SpO2: 97% 97%   Weight:   61.7 kg  Height:   5\' 10"  (1.778 m)      Constitutional: Alert and oriented x 3 . Not in any apparent distress HEENT:      Head: Normocephalic and atraumatic.         Eyes: PERLA, EOMI, Conjunctivae are normal. Sclera is non-icteric.       Mouth/Throat: Mucous membranes are moist.       Neck: Supple with no signs of meningismus. Cardiovascular:  Tachycardic. No murmurs, gallops, or rubs. 2+ symmetrical distal pulses are present . No JVD. No LE edema Respiratory: Respiratory effort normal .  Crackles left base.  Faint wheezes bilaterally,  Gastrointestinal: Soft, non tender, and non distended with positive bowel sounds.  Genitourinary: No CVA tenderness. Musculoskeletal: Nontender with normal range of motion in all extremities. No cyanosis, or erythema of extremities. Neurologic:  Face is symmetric. Moving all extremities. No gross focal neurologic deficits . Skin: Skin is warm, dry.  No rash or ulcers Psychiatric: Mood and affect are normal   Labs on Admission: I have personally reviewed following labs and imaging  studies  CBC: Recent Labs  Lab 04/10/21 2320 04/17/21 0939  WBC 17.6* 24.5*  NEUTROABS 16.4* 21.8*  HGB 12.0* 12.9*  HCT 36.7* 39.6  MCV 87.6 85.5  PLT 233 283   Basic Metabolic Panel: Recent Labs  Lab 04/10/21 2320 04/17/21 0939  NA 134* 135  K 3.8 3.8  CL 99 97*  CO2 23 26  GLUCOSE 96 85  BUN 15 25*  CREATININE 0.69 0.52*  CALCIUM 8.9 8.6*  MG 1.8  --    GFR: Estimated Creatinine Clearance: 77.1 mL/min (A) (by C-G formula based on SCr of 0.52 mg/dL (L)). Liver Function Tests: Recent Labs  Lab 04/10/21 2320 04/17/21 0939  AST 21 23  ALT 11 32  ALKPHOS 74 126  BILITOT 1.3* 1.4*  PROT 8.1 7.4  ALBUMIN 3.7 2.8*   No results for input(s): LIPASE, AMYLASE in the last 168 hours. No results for  input(s): AMMONIA in the last 168 hours. Coagulation Profile: No results for input(s): INR, PROTIME in the last 168 hours. Cardiac Enzymes: No results for input(s): CKTOTAL, CKMB, CKMBINDEX, TROPONINI in the last 168 hours. BNP (last 3 results) No results for input(s): PROBNP in the last 8760 hours. HbA1C: No results for input(s): HGBA1C in the last 72 hours. CBG: No results for input(s): GLUCAP in the last 168 hours. Lipid Profile: No results for input(s): CHOL, HDL, LDLCALC, TRIG, CHOLHDL, LDLDIRECT in the last 72 hours. Thyroid Function Tests: No results for input(s): TSH, T4TOTAL, FREET4, T3FREE, THYROIDAB in the last 72 hours. Anemia Panel: No results for input(s): VITAMINB12, FOLATE, FERRITIN, TIBC, IRON, RETICCTPCT in the last 72 hours. Urine analysis: No results found for: COLORURINE, APPEARANCEUR, LABSPEC, PHURINE, GLUCOSEU, HGBUR, BILIRUBINUR, KETONESUR, PROTEINUR, UROBILINOGEN, NITRITE, LEUKOCYTESUR  Radiological Exams on Admission: DG Chest Port 1 View  Result Date: 04/17/2021 CLINICAL DATA:  Shortness of breath, cough productive of green sputum, history COPD EXAM: PORTABLE CHEST 1 VIEW COMPARISON:  Portable exam 0948 hours compared to 04/10/2021  FINDINGS: Normal heart size, mediastinal contours, and pulmonary vascularity. Atherosclerotic calcification aorta. Emphysematous and bronchitic changes consistent with COPD. New LEFT basilar consolidation consistent with pneumonia. Minimal chronic atelectasis at RIGHT base. No pleural effusion or pneumothorax. Bones demineralized. IMPRESSION: COPD changes with new LEFT basilar consolidation consistent with pneumonia . Electronically Signed   By: Ulyses Southward M.D.   On: 04/17/2021 10:16     Assessment/Plan Principal Problem:   CAP (community acquired pneumonia) Active Problems:   Acute respiratory failure (HCC)   COPD with acute exacerbation (HCC)     Community-acquired pneumonia Patient presents for evaluation of shortness of breath associated with a cough productive of greenish phlegm and left-sided pleuritic chest pain. Chest x-ray shows left lobar pneumonia Patient failed outpatient antibiotic therapy with doxycycline We will place patient on Rocephin and Zithromax Follow-up results of blood cultures    Acute respiratory failure Secondary to community-acquired pneumonia and acute COPD exacerbation Patient had room air pulse oximetry of 85% and is currently on 3 L of oxygen via nasal cannula with pulse oximetry greater than 92% Patient will need to be assessed for home oxygen need prior to discharge    COPD with acute exacerbation Continue scheduled and as needed bronchodilator therapy Continue inhaled steroids  DVT prophylaxis: Lovenox Code Status: full code Family Communication: Greater than 50% of time was spent discussing patient's condition and plan of care with him at the bedside.  All questions and concerns have been addressed.  He verbalizes understanding and agrees with the plan. Disposition Plan: Back to previous home environment Consults called: none Status : At the time of admission, it appears that the appropriate admission status for this patient is  inpatient. This is judged to be reasonable and necessary in order to provide the required intensity of service to ensure the patient's safety given the presenting symptoms, physical exam findings and initial radiographic and laboratory data in the context of their comorbid conditions. Patient requires inpatient status due to high intensity of service, high risk for further deterioration and high frequency of surveillance required.    Frank Shutters MD [facility]     04/17/2021, 11:36 AM

## 2021-04-17 NOTE — Progress Notes (Signed)
CODE SEPSIS - PHARMACY COMMUNICATION  **Broad Spectrum Antibiotics should be administered within 1 hour of Sepsis diagnosis**  Time Code Sepsis Called/Page Received: 1043  Antibiotics Ordered: Azithromycin,Rocephin  Time of 1st antibiotic administration: 1125  Additional action taken by pharmacy: none  If necessary, Name of Provider/Nurse Contacted: n/a    Bettey Costa ,PharmD Clinical Pharmacist  04/17/2021  11:40 AM

## 2021-04-17 NOTE — ED Triage Notes (Signed)
Respiratory Distress from home .   EMS stated they gave him 2 Albuterol / 1 duoNeb / IM solu-med. They also stated he was seen in the ED about a week ago .   Hx- COPD

## 2021-04-17 NOTE — Consult Note (Signed)
PHARMACY -  BRIEF ANTIBIOTIC NOTE   Pharmacy has received consult(s) for Vancomycin and Cefepime from an ED provider.  The patient's profile has been reviewed for ht/wt/allergies/indication/available labs.    One time order(s) placed for Vancomycin 1g IV and Cefepime 2g IV x 1 dose each.  Further antibiotics/pharmacy consults should be ordered by admitting physician if indicated.                       Thank you, Bettey Costa 04/17/2021  10:45 AM

## 2021-04-17 NOTE — ED Notes (Signed)
Informed bed assigned 

## 2021-04-18 LAB — CBC
HCT: 32 % — ABNORMAL LOW (ref 39.0–52.0)
Hemoglobin: 10.6 g/dL — ABNORMAL LOW (ref 13.0–17.0)
MCH: 28.3 pg (ref 26.0–34.0)
MCHC: 33.1 g/dL (ref 30.0–36.0)
MCV: 85.6 fL (ref 80.0–100.0)
Platelets: 240 10*3/uL (ref 150–400)
RBC: 3.74 MIL/uL — ABNORMAL LOW (ref 4.22–5.81)
RDW: 15.6 % — ABNORMAL HIGH (ref 11.5–15.5)
WBC: 16.8 10*3/uL — ABNORMAL HIGH (ref 4.0–10.5)
nRBC: 0 % (ref 0.0–0.2)

## 2021-04-18 LAB — BASIC METABOLIC PANEL
Anion gap: 9 (ref 5–15)
BUN: 15 mg/dL (ref 8–23)
CO2: 26 mmol/L (ref 22–32)
Calcium: 8 mg/dL — ABNORMAL LOW (ref 8.9–10.3)
Chloride: 100 mmol/L (ref 98–111)
Creatinine, Ser: 0.44 mg/dL — ABNORMAL LOW (ref 0.61–1.24)
GFR, Estimated: >60 mL/min (ref >60)
Glucose, Bld: 82 mg/dL (ref 70–99)
Potassium: 3.6 mmol/L (ref 3.5–5.1)
Sodium: 135 mmol/L (ref 135–145)

## 2021-04-18 MED ORDER — BUPRENORPHINE HCL-NALOXONE HCL 8-2 MG SL SUBL
1.0000 | SUBLINGUAL_TABLET | Freq: Every day | SUBLINGUAL | Status: DC
  Administered 2021-04-18: 1 via SUBLINGUAL
  Filled 2021-04-18: qty 1

## 2021-04-18 MED ORDER — FLUTICASONE-UMECLIDIN-VILANT 100-62.5-25 MCG/INH IN AEPB: 1.0000 | INHALATION_SPRAY | Freq: Every day | RESPIRATORY_TRACT | Status: DC

## 2021-04-18 MED ORDER — UMECLIDINIUM BROMIDE 62.5 MCG/INH IN AEPB
1.0000 | INHALATION_SPRAY | Freq: Every day | RESPIRATORY_TRACT | Status: DC
  Administered 2021-04-18 – 2021-04-21 (×4): 1 via RESPIRATORY_TRACT
  Filled 2021-04-18: qty 7

## 2021-04-18 MED ORDER — ASPIRIN 325 MG PO TABS
325.0000 mg | ORAL_TABLET | Freq: Every day | ORAL | Status: DC
  Administered 2021-04-18 – 2021-04-21 (×4): 325 mg via ORAL
  Filled 2021-04-18 (×4): qty 1

## 2021-04-18 MED ORDER — SODIUM CHLORIDE 0.9 % IV SOLN: INTRAVENOUS | Status: DC

## 2021-04-18 MED ORDER — FLUTICASONE FUROATE-VILANTEROL 200-25 MCG/INH IN AEPB
1.0000 | INHALATION_SPRAY | Freq: Every day | RESPIRATORY_TRACT | Status: DC
  Filled 2021-04-18: qty 28

## 2021-04-18 MED ORDER — IBUPROFEN 400 MG PO TABS
400.0000 mg | ORAL_TABLET | Freq: Three times a day (TID) | ORAL | Status: DC
  Administered 2021-04-18 – 2021-04-21 (×10): 400 mg via ORAL
  Filled 2021-04-18 (×10): qty 1

## 2021-04-18 MED ORDER — BUPRENORPHINE HCL-NALOXONE HCL 8-2 MG SL SUBL
1.0000 | SUBLINGUAL_TABLET | Freq: Three times a day (TID) | SUBLINGUAL | Status: DC
  Administered 2021-04-18 – 2021-04-21 (×9): 1 via SUBLINGUAL
  Filled 2021-04-18 (×9): qty 1

## 2021-04-18 MED ORDER — IPRATROPIUM-ALBUTEROL 0.5-2.5 (3) MG/3ML IN SOLN
3.0000 mL | RESPIRATORY_TRACT | Status: DC
  Administered 2021-04-18 – 2021-04-21 (×19): 3 mL via RESPIRATORY_TRACT
  Filled 2021-04-18 (×18): qty 3

## 2021-04-18 MED ORDER — GABAPENTIN 600 MG PO TABS
600.0000 mg | ORAL_TABLET | Freq: Three times a day (TID) | ORAL | Status: DC
  Administered 2021-04-18 – 2021-04-21 (×10): 600 mg via ORAL
  Filled 2021-04-18 (×10): qty 1

## 2021-04-18 NOTE — Hospital Course (Signed)
70 year old veteran community dwelling Severe emphysema (lifelong smoker) On Suboxone COPD Mediastinal/right hilar lymphadenopathy along with RUL and LUL subcentimeter lymph nodes at [facility] in the past Recent Rx [facility] 01/13/2021-at that time had taken an Rx of azithromycin and steroids found to be tachypneic-patient ultimately was sent home Represented 04/10/2021 with significant SOB Rx steroids doxycycline on discharge from there in addition  Called EMS 5/11-was given albuterol DuoNeb Solu-Medrol-worsened again on 5/8-DOE-PND-new oxygen requirement CXR = left basilar consolidation consistent with pneumonia Rx cefepime vancomycin  BUN/creatinine/0.5-->1 25 5/0.4 WBC 24-->16 Lactic acid 1.3-->1.6

## 2021-04-18 NOTE — Progress Notes (Signed)
PROGRESS NOTE   Frank Calderon  MEQ:[id number] DOB: 08/16/1951 DOA: 04/17/2021 PCP: [facility]  Brief Narrative:  70 year old veteran community dwelling Severe emphysema (lifelong smoker) On Suboxone COPD Mediastinal/right hilar lymphadenopathy along with RUL and LUL subcentimeter lymph nodes at [facility] in the past Recent Rx [facility] 01/13/2021-at that time had taken an Rx of azithromycin and steroids found to be tachypneic-patient ultimately was sent home Represented 04/10/2021 with significant SOB Rx steroids doxycycline on discharge from there in addition  Called EMS 5/11-was given albuterol DuoNeb Solu-Medrol-worsened again on 5/8-DOE-PND-new oxygen requirement CXR = left basilar consolidation consistent with pneumonia Rx cefepime vancomycin, T-max 102, blood pressure 94 systolic, lactic acid slightly elevated    Hospital-Problem based course  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  received fluid bolus in the ED- Continue saline 75 cc/H Acute hypoxic respiratory failure Patient will need a desat screen on discharge as he typically does not use oxygen pain on Suboxone [facility] years ago and uses it for subacute pain as he did not wish to use more addictive opiates Resume Suboxone Add ibuprofen if as needed Leukocytosis Component probably secondary to recent steroid use Monitor trends  DVT prophylaxis: Lovenox Code Status: Full Family Communication: None Disposition:  Status is: Inpatient  Remains inpatient appropriate because:Hemodynamically unstable and IV treatments  appropriate due to intensity of illness or inability to take PO   Dispo: The patient is from: Home              Anticipated d/c is to: Home              Patient currently is not medically stable to d/c.   Difficult to place patient No       Consultants:   None  Procedures: None  Antimicrobials: Ceftriaxone azithromycin   Subjective:  Doing well sitting up in the bed Tells me when he ambulated earlier this morning he got quite winded when he took off the oxygen and went to the sink He has no chest pain He has no sputum   Objective: Vitals:   04/18/21 0407 04/18/21 0845 04/18/21 1107 04/18/21 1130  BP: 102/61 97/60 (!) 106/56   Pulse: 78 92 95   Resp: 16  18   Temp: 98.5 F (36.9 C) 97.9 F (36.6 C) 97.6 F (36.4 C)   TempSrc: Oral Oral Oral   SpO2: 99% 96% 97% 95%  Weight:      Height:        Intake/Output Summary (Last 24 hours) at 04/18/2021 1209 Last data filed at 04/18/2021 0533 Gross per 24 hour  Intake 465.27 ml  Output 725 ml  Net -259.73 ml   Filed Weights   04/17/21 0934  Weight: 61.7 kg    Examination:  Well-groomed nourished white male no distress moderate dentition No lymphadenopathy Chest is clear anteriorly however posterolaterally he has rales No egophony no fremitus Abdomen is soft No lower extremity edema  Data Reviewed: personally reviewed   BUN/creatinine/0.5-->1 25 5/0.4 WBC 24-->16 Lactic acid 1.3-->1.6  Radiology Studies: DG Chest Port 1 View  Result Date: 04/17/2021 CLINICAL DATA:  Shortness of breath, cough productive of green sputum, history COPD EXAM: PORTABLE CHEST 1  VIEW COMPARISON:  Portable exam 0948 hours compared to 04/10/2021 FINDINGS: Normal heart size, mediastinal contours, and pulmonary vascularity. Atherosclerotic calcification aorta. Emphysematous and bronchitic changes consistent with COPD. New LEFT basilar consolidation consistent with pneumonia. Minimal chronic atelectasis at RIGHT base. No pleural  effusion or pneumothorax. Bones demineralized. IMPRESSION: COPD changes with new LEFT basilar consolidation consistent with pneumonia . Electronically Signed   By: Ulyses Southward M.D.   On: 04/17/2021 10:16     Scheduled Meds: . aspirin  325 mg Oral Daily  . buprenorphine-naloxone  1 tablet Sublingual Daily  . enoxaparin (LOVENOX) injection  40 mg Subcutaneous Q24H  . gabapentin  600 mg Oral TID  . guaiFENesin  1,200 mg Oral BID  . ibuprofen  400 mg Oral TID  . ipratropium-albuterol  3 mL Inhalation Q4H  . mometasone-formoterol  2 puff Inhalation BID  . umeclidinium bromide  1 puff Inhalation Daily   Continuous Infusions: . azithromycin Stopped (04/17/21 1223)  . cefTRIAXone (ROCEPHIN)  IV Stopped (04/17/21 1156)     LOS: 1 day   Time spent: 28 minutes  Rhetta Mura, MD [facility] To contact the attending provider between 7A-7P or the covering provider during after hours 7P-7A, please log into the web site [email or web address] and access using universal [place] password for that web site. If you do not have the password, please call the hospital operator.  04/18/2021, 12:09 PM

## 2021-04-19 LAB — CBC WITH DIFFERENTIAL/PLATELET
Abs Immature Granulocytes: 0.23 10*3/uL — ABNORMAL HIGH (ref 0.00–0.07)
Basophils Absolute: 0 10*3/uL (ref 0.0–0.1)
Basophils Relative: 0 %
Eosinophils Absolute: 0.1 10*3/uL (ref 0.0–0.5)
Eosinophils Relative: 1 %
HCT: 31 % — ABNORMAL LOW (ref 39.0–52.0)
Hemoglobin: 10.2 g/dL — ABNORMAL LOW (ref 13.0–17.0)
Immature Granulocytes: 1 %
Lymphocytes Relative: 8 %
Lymphs Abs: 1.3 10*3/uL (ref 0.7–4.0)
MCH: 28.4 pg (ref 26.0–34.0)
MCHC: 32.9 g/dL (ref 30.0–36.0)
MCV: 86.4 fL (ref 80.0–100.0)
Monocytes Absolute: 0.8 10*3/uL (ref 0.1–1.0)
Monocytes Relative: 5 %
Neutro Abs: 14.7 10*3/uL — ABNORMAL HIGH (ref 1.7–7.7)
Neutrophils Relative %: 85 %
Platelets: 253 10*3/uL (ref 150–400)
RBC: 3.59 MIL/uL — ABNORMAL LOW (ref 4.22–5.81)
RDW: 15.9 % — ABNORMAL HIGH (ref 11.5–15.5)
WBC: 17.2 10*3/uL — ABNORMAL HIGH (ref 4.0–10.5)
nRBC: 0 % (ref 0.0–0.2)

## 2021-04-19 LAB — BASIC METABOLIC PANEL
Anion gap: 7 (ref 5–15)
BUN: 10 mg/dL (ref 8–23)
CO2: 27 mmol/L (ref 22–32)
Calcium: 7.8 mg/dL — ABNORMAL LOW (ref 8.9–10.3)
Chloride: 103 mmol/L (ref 98–111)
Creatinine, Ser: 0.52 mg/dL — ABNORMAL LOW (ref 0.61–1.24)
GFR, Estimated: >60 mL/min (ref >60)
Glucose, Bld: 102 mg/dL — ABNORMAL HIGH (ref 70–99)
Potassium: 3.3 mmol/L — ABNORMAL LOW (ref 3.5–5.1)
Sodium: 137 mmol/L (ref 135–145)

## 2021-04-19 LAB — EXPECTORATED SPUTUM ASSESSMENT W GRAM STAIN, RFLX TO RESP C

## 2021-04-19 MED ORDER — POTASSIUM CHLORIDE CRYS ER 20 MEQ PO TBCR
40.0000 meq | EXTENDED_RELEASE_TABLET | Freq: Two times a day (BID) | ORAL | Status: DC
  Administered 2021-04-19 – 2021-04-21 (×5): 40 meq via ORAL
  Filled 2021-04-19 (×5): qty 2

## 2021-04-19 NOTE — Progress Notes (Signed)
PROGRESS NOTE   Frank Calderon  LZJ:[id number] DOB: 1951-04-24 DOA: 04/17/2021 PCP: [facility]  Brief Narrative:  70 year old veteran community dwelling Severe emphysema (lifelong smoker) On Suboxone COPD Mediastinal/right hilar lymphadenopathy along with RUL and LUL subcentimeter lymph nodes at [facility] in the past Recent Rx [facility] 01/13/2021-at that time had taken an Rx of azithromycin and steroids found to be tachypneic-patient ultimately was sent home Represented 04/10/2021 with significant SOB Rx steroids doxycycline on discharge from there in addition  Called EMS 5/11-was given albuterol DuoNeb Solu-Medrol-worsened again on 5/8-DOE-PND-new oxygen requirement CXR = left basilar consolidation consistent with pneumonia Rx cefepime vancomycin, T-max 102, blood pressure 94 systolic, lactic acid slightly elevated   he was admitted for pneumonia superimposed on acute COPD exacerbation coughing more Some pleurisy   Hospital-Problem based course  Acute hypoxic respiratory failure Patient will need a desat screen on discharge as he typically does not use oxygen 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 5/14 pain on Suboxone Patient [facility] 8 yr prior uses it for subacute pain  Resume Suboxone 3 times daily Add ibuprofen if as needed Leukocytosis Component probably secondary to  recent steroid use Monitor trends Mild hypokalemia  DVT prophylaxis: Lovenox Code Status: Full Family Communication: None Disposition:  Status is: Inpatient  Remains inpatient appropriate because:Hemodynamically unstable and IV treatments appropriate due to intensity of illness or inability to take PO   Dispo: The patient is from: Home              Anticipated d/c is to: Home              Patient currently is not medically stable to d/c.   Difficult to place patient No       Consultants:   None  Procedures: None  Antimicrobials: Ceftriaxone azithromycin   Subjective:  No chest pain but coughing more with some pleurisy thick tan secretions No nausea no vomiting Ambulatory minimally states he feels about 50 to 60% of his normal strength (very active individual works at a [employer] part-time)  Objective: Vitals:   04/19/21 0516 04/19/21 0815 04/19/21 1108 04/19/21 1529  BP:  97/60 93/60 107/69  Pulse: 88 88 96 95  Resp: 16 16 16 18   Temp:  98.6 F (37 C) 98.7 F (37.1 C) 99.9 F (37.7 C)  TempSrc:  Oral Oral Oral  SpO2: 95% 97% 96% 98%  Weight:      Height:        Intake/Output Summary (Last 24 hours) at 04/19/2021 1545 Last data filed at 04/19/2021 1511 Gross per 24 hour  Intake 2143.49 ml  Output 700 ml  Net 1443.49 ml   Filed Weights   04/17/21 0934  Weight: 61.7 kg    Examination:  Well-groomed nourished Moderate dentition Chest clear no added sound anteriorly however increasing rhonchi and rales posterior laterally compared to prior exam Abdomen soft no rebound No lower extremity edema Neurologically intact moving all 4 limbs  Data Reviewed:  personally reviewed   BUN/creatinine 15/0.44-->10/0.5 WBC 24-->16-->17.2   Radiology Studies: No results found.   Scheduled Meds: . aspirin  325 mg Oral Daily  . buprenorphine-naloxone  1 tablet Sublingual TID  . enoxaparin (LOVENOX) injection  40 mg Subcutaneous Q24H  . gabapentin   600 mg Oral TID  . guaiFENesin  1,200 mg Oral BID  . ibuprofen  400 mg Oral TID  . ipratropium-albuterol  3 mL Inhalation Q4H  . mometasone-formoterol  2 puff Inhalation BID  . potassium chloride  40 mEq Oral BID  . umeclidinium bromide  1 puff Inhalation Daily   Continuous Infusions: . azithromycin Stopped (04/19/21 1104)  . cefTRIAXone (ROCEPHIN)  IV Stopped (04/19/21 0857)     LOS: 2 days   Time spent: 33 minutes  Rhetta Mura, MD [facility] To contact the attending provider between 7A-7P or the covering provider during after hours 7P-7A, please log into the web site [email or web address] and access using universal [place] password for that web site. If you do not have the password, please call the hospital operator.  04/19/2021, 3:45 PM

## 2021-04-19 NOTE — Care Management Important Message (Signed)
Important Message  Patient Details  Name: Frank Calderon MRN: [id number] Date of Birth: 03/24/1951   Medicare Important Message Given:  N/A - LOS <3 / Initial given by admissions  Initial Medicare IM reviewed with patient by S. Conard Novak, Patient Access Associate on 04/19/2021 at 9:12am.   [name] 04/19/2021, 3:58 PM

## 2021-04-19 NOTE — Plan of Care (Signed)

## 2021-04-20 LAB — BASIC METABOLIC PANEL
Anion gap: 5 (ref 5–15)
BUN: 8 mg/dL (ref 8–23)
CO2: 27 mmol/L (ref 22–32)
Calcium: 7.8 mg/dL — ABNORMAL LOW (ref 8.9–10.3)
Chloride: 102 mmol/L (ref 98–111)
Creatinine, Ser: 0.5 mg/dL — ABNORMAL LOW (ref 0.61–1.24)
GFR, Estimated: >60 mL/min (ref >60)
Glucose, Bld: 104 mg/dL — ABNORMAL HIGH (ref 70–99)
Potassium: 3.9 mmol/L (ref 3.5–5.1)
Sodium: 134 mmol/L — ABNORMAL LOW (ref 135–145)

## 2021-04-20 MED ORDER — LEVOFLOXACIN 500 MG PO TABS
500.0000 mg | ORAL_TABLET | Freq: Every day | ORAL | Status: DC
  Administered 2021-04-20 – 2021-04-21 (×2): 500 mg via ORAL
  Filled 2021-04-20 (×2): qty 1

## 2021-04-20 NOTE — Progress Notes (Signed)
PROGRESS NOTE   Frank Calderon  UVO:[id number] DOB: 25-May-1951 DOA: 04/17/2021 PCP: [facility]  Brief Narrative:  70 year old veteran community dwelling Severe emphysema (lifelong smoker) On Suboxone COPD Mediastinal/right hilar lymphadenopathy along with RUL and LUL subcentimeter lymph nodes at [facility] in the past Recent Rx [facility] 01/13/2021-at that time had taken an Rx of azithromycin and steroids found to be tachypneic-patient ultimately was sent home Represented 04/10/2021 with significant SOB Rx steroids doxycycline on discharge from there in addition  Called EMS 5/11-was given albuterol DuoNeb Solu-Medrol-worsened again on 5/8-DOE-PND-new oxygen requirement CXR = left basilar consolidation consistent with pneumonia Rx cefepime vancomycin, T-max 102, blood pressure 94 systolic, lactic acid slightly elevated   he was admitted for pneumonia superimposed on acute COPD exacerbation coughing more Some pleurisy   Hospital-Problem based course  Acute hypoxic respiratory failure secondary to pneumonia superimposed on severe emphysema Will need 2 L of oxygen at rest on discharge 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/continued smoker Multiple recent ED visits culminating with possible pneumonia Continue Mucinex 1200 twice daily, DuoNeb every 6 and every 4 Dulera 2 puff twice daily and Incruse Ellipta Sepsis on admission community-acquired pneumonia Transition ceftriaxone/azithromycin--Levaquin 5/14 Follow and tailor antibiotics based on sputum culture 5/13 Saline discontinued 5/13 Improving overall pain on Suboxone Patient [facility] 8 yr prior uses it for subacute pain  Resume Suboxone 3 times daily Add ibuprofen if as needed Leukocytosis Component probably secondary to recent steroid use Monitor trends Mild hypokalemia  DVT prophylaxis: Lovenox Code Status: Full Family Communication: None Disposition:  Status is:  Inpatient  Remains inpatient appropriate because:Hemodynamically unstable and IV treatments appropriate due to intensity of illness or inability to take PO   Dispo: The patient is from: Home              Anticipated d/c is to: Home              Patient currently is not medically stable to d/c.   Difficult to place patient No       Consultants:   None  Procedures: None  Antimicrobials: Ceftriaxone azithromycin   Subjective:  Overall well no distress eating drinking Got very hypoxic and dizzy when ambulating on the unit earlier today   Objective: Vitals:   04/20/21 0113 04/20/21 0406 04/20/21 0820 04/20/21 1115  BP:  104/62 94/65 95/65   Pulse:  92 99 98  Resp:   14   Temp:  98 F (36.7 C) 98.1 F (36.7 C) 98 F (36.7 C)  TempSrc:    Oral  SpO2: 95% 95% 97% 91%  Weight:      Height:        Intake/Output Summary (Last 24 hours) at 04/20/2021 1515 Last data filed at 04/20/2021 1148 Gross per 24 hour  Intake 120 ml  Output 2550 ml  Net -2430 ml   Filed Weights   04/17/21 0934  Weight: 61.7 kg    Examination:  No distress Moderate dentition Some rales rhonchi posterolaterally, no wheeze Abdomen soft no rebound No lower extremity edema Neurologically intact moving all 4 limbs  Data Reviewed: personally reviewed   BUN/creatinine 15/0.44-->10/0.5-->8/0.5  Radiology Studies: No results found.   Scheduled Meds: . aspirin  325 mg Oral Daily  . buprenorphine-naloxone  1 tablet Sublingual TID  . enoxaparin (LOVENOX) injection  40 mg Subcutaneous Q24H  . gabapentin  600 mg Oral TID  . guaiFENesin  1,200 mg Oral BID  . ibuprofen  400 mg Oral  TID  . ipratropium-albuterol  3 mL Inhalation Q4H  . levofloxacin  500 mg Oral Daily  . mometasone-formoterol  2 puff Inhalation BID  . potassium chloride  40 mEq Oral BID  . umeclidinium bromide  1 puff Inhalation Daily   Continuous Infusions:    LOS: 3 days   Time spent: 23 minutes  Rhetta Mura,  MD [facility] To contact the attending provider between 7A-7P or the covering provider during after hours 7P-7A, please log into the web site [email or web address] and access using universal [place] password for that web site. If you do not have the password, please call the hospital operator.  04/20/2021, 3:15 PM

## 2021-04-20 NOTE — Progress Notes (Addendum)
Mobility Specialist - Progress Note   04/20/21 1500  Mobility  Activity Ambulated in hall  Level of Assistance Standby assist, set-up cues, supervision of patient - no hands on  Assistive Device None  Distance Ambulated (ft) 80 ft  Mobility Ambulated with assistance in hallway  Mobility Response Tolerated well  Mobility performed by Mobility specialist  $Mobility charge 1 Mobility    Pt ambulated in hallway with supervision. Used rails for increased steadiness. Independent bed mobility. Supervised transfers. Orthostatics obtained prior to activity:  Supine: 104/62 Seated: 94/57 Standing: 93/60 Does voice mild dizziness upon standing. No LOB. HR ranging between 106-123 bpm during session. Pt states he "feels like something is pulling right here" pointing to LUQ. Voiced SOB, O2 sats listed below. PLB engaged throughout activity. Pt returned supine and O2 desat to a low of 84%, placed back on 1L with sats rebounding to 91% prior to exit. No O2 use PTA.  O2 while resting on RA = 89% O2 while AMB on RA = 86% O2 while AMB on 2L = 90%   Frank Calderon Mobility Specialist 04/20/21, 3:50 PM

## 2021-04-20 NOTE — Progress Notes (Signed)
Message to pharmacy- patient states he takes suboxone at 6a, 2p, and 8p at home as this helps better manage his chronic pain. Requested times be adjusted.

## 2021-04-20 NOTE — Progress Notes (Addendum)
SATURATION QUALIFICATIONS: (This note is used to comply with regulatory documentation for home oxygen)  Patient Saturations on Room Air at Rest = 92%  Patient Saturations on Room Air while Ambulating = 83%  Patient was in room ambulating to restroom, got very short of breath with increased respiratory effort, accessory muscle use. Assisted to bed breathing treatment and O2 came up to 96% at rest. Will reassess SPO2 ambulating on oxygen.  Please briefly explain why patient needs home oxygen: Patient potential discharge, desaturation ambulating short distances on room air.  ADDENDUM 1530- patient was ambulated by mobility. SPO2 90% ambulating on 2L of oxygen.

## 2021-04-21 LAB — CBC WITH DIFFERENTIAL/PLATELET
Abs Immature Granulocytes: 0.22 10*3/uL — ABNORMAL HIGH (ref 0.00–0.07)
Basophils Absolute: 0.1 10*3/uL (ref 0.0–0.1)
Basophils Relative: 0 %
Eosinophils Absolute: 0.2 10*3/uL (ref 0.0–0.5)
Eosinophils Relative: 1 %
HCT: 32.5 % — ABNORMAL LOW (ref 39.0–52.0)
Hemoglobin: 10.5 g/dL — ABNORMAL LOW (ref 13.0–17.0)
Immature Granulocytes: 1 %
Lymphocytes Relative: 5 %
Lymphs Abs: 1 10*3/uL (ref 0.7–4.0)
MCH: 28.2 pg (ref 26.0–34.0)
MCHC: 32.3 g/dL (ref 30.0–36.0)
MCV: 87.4 fL (ref 80.0–100.0)
Monocytes Absolute: 0.9 10*3/uL (ref 0.1–1.0)
Monocytes Relative: 5 %
Neutro Abs: 16.2 10*3/uL — ABNORMAL HIGH (ref 1.7–7.7)
Neutrophils Relative %: 88 %
Platelets: 305 10*3/uL (ref 150–400)
RBC: 3.72 MIL/uL — ABNORMAL LOW (ref 4.22–5.81)
RDW: 15.7 % — ABNORMAL HIGH (ref 11.5–15.5)
WBC: 18.4 10*3/uL — ABNORMAL HIGH (ref 4.0–10.5)
nRBC: 0 % (ref 0.0–0.2)

## 2021-04-21 MED ORDER — GUAIFENESIN ER 600 MG PO TB12: 1200.0000 mg | ORAL_TABLET | Freq: Two times a day (BID) | ORAL | 0 refills | Status: DC

## 2021-04-21 MED ORDER — PREDNISONE 50 MG PO TABS: ORAL_TABLET | ORAL | 0 refills | Status: DC

## 2021-04-21 MED ORDER — IBUPROFEN 400 MG PO TABS: 400.0000 mg | ORAL_TABLET | Freq: Three times a day (TID) | ORAL | 0 refills | Status: DC

## 2021-04-21 MED ORDER — LEVOFLOXACIN 500 MG PO TABS: 500.0000 mg | ORAL_TABLET | Freq: Every day | ORAL | 0 refills | Status: DC

## 2021-04-21 NOTE — Discharge Summary (Signed)
Physician Discharge Summary  Lawrnce Reyez IRW:[id number] DOB: Dec 22, 1950 DOA: 04/17/2021  PCP: [facility]  Admit date: 04/17/2021 Discharge date: 04/21/2021  Time spent: 27 minutes  Recommendations for Outpatient Follow-up:  1. Needs burst of steroids for 5 days, complete Levaquin in the outpatient setting 2. Please follow sputum culture that was performed-should be covered by Levaquin in the outpatient setting-adjust antibiotics as needed 3. Discharging home on home oxygen  Discharge Diagnoses:  MAIN problem for hospitalization   community-acquired pneumonia superimposed on severe emphysema Acute hypoxic respiratory failure secondary to pneumonia  Please see below for itemized issues addressed in HOpsital- refer to other progress notes for clarity if needed  Discharge Condition: Improved  Diet recommendation: Heart healthy  Filed Weights   04/17/21 0934  Weight: 61.7 kg    History of present illness:  70 year old veteran community dwelling Severe emphysema (lifelong smoker) On Suboxone COPD Mediastinal/right hilar lymphadenopathy along with RUL and LUL subcentimeter lymph nodes at [facility] in the past Recent Rx [facility] 01/13/2021-at that time had taken an Rx of azithromycin and steroids found to be tachypneic-patient ultimately was sent home Represented 04/10/2021 with significant SOB Rx steroids doxycycline on discharge from there in addition  Called EMS 5/11-was given albuterol DuoNeb Solu-Medrol-worsened again on 5/8-DOE-PND-new oxygen requirement CXR = left basilar consolidation consistent with pneumonia Rx cefepime vancomycin, T-max 102, blood pressure 94 systolic, lactic acid slightly elevated   he was admitted for pneumonia superimposed on acute COPD exacerbation coughing more Some pleurisy  Hospital Course:  Acute hypoxic respiratory failure secondary to pneumonia superimposed on severe emphysema Will need 2 L of oxygen at rest on  discharge Complete treatment in the outpatient setting and repeat chest x-ray in about 1 month 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/continued smoker Multiple recent ED visits culminating with possible pneumonia Continue Mucinex 1200 twice daily, DuoNeb every 6 and every 4 Dulera 2 puff twice daily and Incruse Ellipta giving a burst of steroids prednisone 50 mg on discharge and needs outpatient close follow-up with PCP Sepsis on admission community-acquired pneumonia Transition ceftriaxone/azithromycin--Levaquin 5/14 Follow and tailor antibiotics based on sputum culture 5/13 Saline discontinued 5/13 Improving overall pain on Suboxone Patient [facility] 8 yr prior uses it for subacute pain  Resume Suboxone 3 times daily Add ibuprofen if as needed Leukocytosis Component probably secondary to recent steroid use Monitor trends Mild hypokalemia  Procedures: X-rays Consultations:  None  Discharge Exam: Vitals:   04/21/21 0411 04/21/21 0730  BP: (!) 101/59 106/73  Pulse: 100 95  Resp: 16 18  Temp: 98 F (36.7 C) 98.4 F (36.9 C)  SpO2: 98% 95%    Subj on day of d/c   Awake alert sitting up in bed Mild chest pain with coughing-no radiation down either arm Overall he is feeling otherwise well  General Exam on discharge  EOMI NCAT no focal deficit S1-S2 no murmur no rub no gallop CTA B Abdomen soft no rebound Mild lower extremity edema Neurologically intact Moderate addition   Discharge Instructions   Discharge Instructions    Diet - low sodium heart healthy   Complete by: As directed    Discharge instructions   Complete by: As directed    Make sure you take all of your Levaquin tablets and complete them as he had a pneumonia You should continue all of your inhalers and ensure that you continue them in the outpatient setting I would recommend that you use ibuprofen for pain in your  chest from coughing We have given u a  limited Rx for Prednisone--see your doctor in 1 week  You will require oxygen on discharge at least temporarily and you should seek follow-up with either the [facility] or your primary care physician locally to coordinate either further needs or discontinuation of this-they can check you and assess your needs   Increase activity slowly   Complete by: As directed      Allergies as of 04/21/2021   No Known Allergies     Medication List    STOP taking these medications   celecoxib 200 MG capsule Commonly known as: CELEBREX   doxycycline 100 MG capsule Commonly known as: VIBRAMYCIN   doxycycline 100 MG tablet Commonly known as: VIBRA-TABS     TAKE these medications   albuterol 108 (90 Base) MCG/ACT inhaler Commonly known as: VENTOLIN HFA Inhale 2 puffs into the lungs every 6 (six) hours as needed.   aspirin 325 MG tablet Take 325 mg by mouth daily as needed.   buprenorphine-naloxone 8-2 mg Subl SL tablet Commonly known as: SUBOXONE Place 1 tablet under the tongue daily.   fluticasone-salmeterol 250-50 MCG/ACT Aepb Commonly known as: ADVAIR 1 puff in the morning and at bedtime.   gabapentin 600 MG tablet Commonly known as: NEURONTIN Take 600 mg by mouth 3 (three) times daily.   guaiFENesin 600 MG 12 hr tablet Commonly known as: MUCINEX Take 2 tablets (1,200 mg total) by mouth 2 (two) times daily.   ibuprofen 400 MG tablet Commonly known as: ADVIL Take 1 tablet (400 mg total) by mouth 3 (three) times daily.   ipratropium-albuterol 0.5-2.5 (3) MG/3ML Soln Commonly known as: DUONEB Inhale 3 mLs into the lungs every 6 (six) hours as needed.   levofloxacin 500 MG tablet Commonly known as: LEVAQUIN Take 1 tablet (500 mg total) by mouth daily. Start taking on: Apr 22, 2021   predniSONE 50 MG tablet Commonly known as: DELTASONE Take 1 tab daily then stop What changed:   medication strength  how much to take  how to take this  additional instructions   Trelegy  Ellipta 100-62.5-25 MCG/INH Aepb Generic drug: Fluticasone-Umeclidin-Vilant Inhale 1 puff into the lungs daily.      No Known Allergies    The results of significant diagnostics from this hospitalization (including imaging, microbiology, ancillary and laboratory) are listed below for reference.    Significant Diagnostic Studies: DG Chest Port 1 View  Result Date: 04/17/2021 CLINICAL DATA:  Shortness of breath, cough productive of green sputum, history COPD EXAM: PORTABLE CHEST 1 VIEW COMPARISON:  Portable exam 0948 hours compared to 04/10/2021 FINDINGS: Normal heart size, mediastinal contours, and pulmonary vascularity. Atherosclerotic calcification aorta. Emphysematous and bronchitic changes consistent with COPD. New LEFT basilar consolidation consistent with pneumonia. Minimal chronic atelectasis at RIGHT base. No pleural effusion or pneumothorax. Bones demineralized. IMPRESSION: COPD changes with new LEFT basilar consolidation consistent with pneumonia . Electronically Signed   By: Ulyses SouthwardMark  Boles M.D.   On: 04/17/2021 10:16   DG Chest Portable 1 View  Result Date: 04/10/2021 CLINICAL DATA:  Shortness of breath. EXAM: PORTABLE CHEST 1 VIEW COMPARISON:  None. FINDINGS: The lungs are mildly hyperinflated. Moderate severity emphysematous lung disease is seen. Mild to moderate severity areas of atelectasis and/or early infiltrate are also seen within the bilateral lung bases. There is no evidence of a pleural effusion or pneumothorax. The heart size and mediastinal contours are within normal limits. Degenerative changes seen throughout the thoracic spine. IMPRESSION: COPD with mild  to moderate severity bibasilar atelectasis and/or early infiltrate. Electronically Signed   By: Aram Candela M.D.   On: 04/10/2021 23:27    Microbiology: Recent Results (from the past 240 hour(s))  Resp Panel by RT-PCR (Flu A&B, Covid) Nasopharyngeal Swab     Status: None   Collection Time: 04/17/21  9:39 AM    Specimen: Nasopharyngeal Swab; Nasopharyngeal(NP) swabs in vial transport medium  Result Value Ref Range Status   SARS Coronavirus 2 by RT PCR NEGATIVE NEGATIVE Final    Comment: (NOTE) SARS-CoV-2 target nucleic acids are NOT DETECTED.  The SARS-CoV-2 RNA is generally detectable in upper respiratory specimens during the acute phase of infection. The lowest concentration of SARS-CoV-2 viral copies this assay can detect is 138 copies/mL. A negative result does not preclude SARS-Cov-2 infection and should not be used as the sole basis for treatment or other patient management decisions. A negative result may occur with  improper specimen collection/handling, submission of specimen other than nasopharyngeal swab, presence of viral mutation(s) within the areas targeted by this assay, and inadequate number of viral copies(<138 copies/mL). A negative result must be combined with clinical observations, patient history, and epidemiological information. The expected result is Negative.  Fact Sheet for Patients:  BloggerCourse.com  Fact Sheet for Healthcare Providers:  SeriousBroker.it  This test is no t yet approved or cleared by the [place] FDA and  has been authorized for detection and/or diagnosis of SARS-CoV-2 by FDA under an Emergency Use Authorization (EUA). This EUA will remain  in effect (meaning this test can be used) for the duration of the COVID-19 declaration under Section 564(b)(1) of the Act, 21 U.S.C.section 360bbb-3(b)(1), unless the authorization is terminated  or revoked sooner.       Influenza A by PCR NEGATIVE NEGATIVE Final   Influenza B by PCR NEGATIVE NEGATIVE Final    Comment: (NOTE) The Xpert Xpress SARS-CoV-2/FLU/RSV plus assay is intended as an aid in the diagnosis of influenza from Nasopharyngeal swab specimens and should not be used as a sole basis for treatment. Nasal washings and aspirates are  unacceptable for Xpert Xpress SARS-CoV-2/FLU/RSV testing.  Fact Sheet for Patients: BloggerCourse.com  Fact Sheet for Healthcare Providers: SeriousBroker.it  This test is not yet approved or cleared by the [place] FDA and has been authorized for detection and/or diagnosis of SARS-CoV-2 by FDA under an Emergency Use Authorization (EUA). This EUA will remain in effect (meaning this test can be used) for the duration of the COVID-19 declaration under Section 564(b)(1) of the Act, 21 U.S.C. section 360bbb-3(b)(1), unless the authorization is terminated or revoked.  Performed at [facility], [address] [place], [place] [address]   Culture, blood (single)     Status: None (Preliminary result)   Collection Time: 04/17/21 11:18 AM   Specimen: BLOOD  Result Value Ref Range Status   Specimen Description BLOOD LEFT ANTECUBITAL  Final   Special Requests   Final    BOTTLES DRAWN AEROBIC AND ANAEROBIC Blood Culture adequate volume   Culture   Final    NO GROWTH 4 DAYS Performed at [facility], [address] [place], [place] [address]    Report Status PENDING  Incomplete  Expectorated Sputum Assessment w Gram Stain, Rflx to Resp Cult     Status: None   Collection Time: 04/19/21 11:36 AM   Specimen: Sputum  Result Value Ref Range Status   Specimen Description SPUTUM  Final   Special Requests SPUTUM  Final   Sputum  evaluation   Final    THIS SPECIMEN IS ACCEPTABLE FOR SPUTUM CULTURE Performed at [facility], [address] [place], [place] [address]    Report Status 04/19/2021 FINAL  Final  Culture, Respiratory w Gram Stain     Status: None (Preliminary result)   Collection Time: 04/19/21 11:36 AM   Specimen: SPU  Result Value Ref Range Status   Specimen Description   Final    SPUTUM Performed at [facility], [address], [place], [place] [address]    Special Requests    Final    SPUTUM Reflexed from [id number] Performed at [facility], [address] [place], [place] [address]    Gram Stain   Final    ABUNDANT WBC PRESENT, PREDOMINANTLY PMN NO ORGANISMS SEEN    Culture   Final    TOO YOUNG TO READ Performed at [facility], [address] [place], [place] [address]    Report Status PENDING  Incomplete     Labs: Basic Metabolic Panel: Recent Labs  Lab 04/17/21 0939 04/18/21 0339 04/19/21 0418 04/20/21 0815  NA 135 135 137 134*  K 3.8 3.6 3.3* 3.9  CL 97* 100 103 102  CO2 26 26 27 27   GLUCOSE 85 82 102* 104*  BUN 25* 15 10 8   CREATININE 0.52* 0.44* 0.52* 0.50*  CALCIUM 8.6* 8.0* 7.8* 7.8*   Liver Function Tests: Recent Labs  Lab 04/17/21 0939  AST 23  ALT 32  ALKPHOS 126  BILITOT 1.4*  PROT 7.4  ALBUMIN 2.8*   No results for input(s): LIPASE, AMYLASE in the last 168 hours. No results for input(s): AMMONIA in the last 168 hours. CBC: Recent Labs  Lab 04/17/21 0939 04/18/21 0339 04/19/21 0418 04/21/21 0858  WBC 24.5* 16.8* 17.2* 18.4*  NEUTROABS 21.8*  --  14.7* 16.2*  HGB 12.9* 10.6* 10.2* 10.5*  HCT 39.6 32.0* 31.0* 32.5*  MCV 85.5 85.6 86.4 87.4  PLT 283 240 253 305   Cardiac Enzymes: No results for input(s): CKTOTAL, CKMB, CKMBINDEX, TROPONINI in the last 168 hours. BNP: BNP (last 3 results) Recent Labs    04/10/21 2320 04/17/21 0939  BNP 75.6 83.1    ProBNP (last 3 results) No results for input(s): PROBNP in the last 8760 hours.  CBG: No results for input(s): GLUCAP in the last 168 hours.     Signed:  06/10/21 MD   [facility] 04/21/2021, 9:15 AM

## 2021-04-21 NOTE — TOC Initial Note (Signed)
Transition of Care [facility]) - Initial/Assessment Note    Patient Details  Name: Frank Calderon MRN: [id number] Date of Birth: 06-01-1951  Transition of [facility]) CM/SW Contact:    Luvenia Redden, RN Phone Number: [phone] 04/21/2021, 8:40 AM  Clinical Narrative:                 R ecommended home O2. Spoke with pt who agreed and provided initial assessment information. Pt lives in a mobile home with his child and has a good support system. Pt states he is able to obtain all his medications and has sufficient transportation to all medical appointments. Home O2 arranged with [employer] who will deliver portable to the pt's room and concentrator to the pt's home in [place]. No other needs presented at this time.   TOC will continue to follow up accordingly with any additional needs.  Expected Discharge Plan: Home w Home Health Services Barriers to Discharge: Barriers Resolved   Patient Goals and CMS Choice        Expected Discharge Plan and Services Expected Discharge Plan: Home w Home Health Services   Discharge Planning Services: CM Consult   Living arrangements for the past 2 months: Mobile Home                 DME Arranged: Oxygen DME Agency: Other - Comment Loyal [facility]) Date DME Agency Contacted: 04/21/21 Time DME Agency Contacted: [phone] Representative spoke with at DME Agency: Vaughan Basta            Prior Living Arrangements/Services Living arrangements for the past 2 months: Mobile Home Lives with:: Adult Children Patient language and need for interpreter reviewed:: Yes Do you feel safe going back to the place where you live?: Yes      Need for Family Participation in Patient Care: No (Comment) Care giver support system in place?: No (comment)   Criminal Activity/Legal Involvement Pertinent to Current Situation/Hospitalization: No - Comment as needed  Activities of Daily Living Home Assistive Devices/Equipment: Eyeglasses ADL Screening (condition at time of  admission) Patient's cognitive ability adequate to safely complete daily activities?: Yes Is the patient deaf or have difficulty hearing?: Yes Does the patient have difficulty seeing, even when wearing glasses/contacts?: No Does the patient have difficulty concentrating, remembering, or making decisions?: No Patient able to express need for assistance with ADLs?: Yes Does the patient have difficulty dressing or bathing?: No Independently performs ADLs?: Yes (appropriate for developmental age) Does the patient have difficulty walking or climbing stairs?: No Weakness of Legs: None Weakness of Arms/Hands: None  Permission Sought/Granted   Permission granted to share information with : Yes, Verbal Permission Granted              Emotional Assessment Appearance:: Appears stated age Attitude/Demeanor/Rapport: Engaged Affect (typically observed): Accepting Orientation: : Oriented to Self,Oriented to Place,Oriented to  Time,Oriented to Situation Alcohol / Substance Use: Not Applicable Psych Involvement: No (comment)  Admission diagnosis:  SOB (shortness of breath) [R06.02] Hypoxia [R09.02] CAP (community acquired pneumonia) [J18.9] HCAP (healthcare-associated pneumonia) [J18.9] Patient Active Problem List   Diagnosis Date Noted  . CAP (community acquired pneumonia) 04/17/2021  . Acute respiratory failure (HCC) 04/17/2021  . COPD with acute exacerbation (HCC) 04/17/2021   PCP:  [facility] Pharmacy:   [facility]  [address] [place] [address] Phone: [phone] Fax: [phone]     Social Determinants of Health (SDOH) Interventions    Readmission Risk Interventions No flowsheet  data found.

## 2021-04-21 NOTE — Progress Notes (Signed)
Verified D/C home orders. Reviewed AVS, discharge instructions, medications- verbalized udnerstanding. Patient did teachback with oxygen. Vitals and assessment stable. Volunteer services called, to remove patient from room and escort to medical mall entrance- patient's daughter/ son in law will pick up patient at entrance per patient.

## 2021-04-22 LAB — CULTURE, RESPIRATORY W GRAM STAIN

## 2021-04-22 LAB — CULTURE, BLOOD (SINGLE)
Culture: NO GROWTH
Special Requests: ADEQUATE

## 2021-04-26 ENCOUNTER — Emergency Department: Payer: Medicare Other

## 2021-04-26 ENCOUNTER — Inpatient Hospital Stay
Admission: EM | Admit: 2021-04-26 | Discharge: 2021-04-30 | DRG: 177 | Disposition: A | Discharge status: Home Health | Payer: Medicare Other | Attending: Internal Medicine | Admitting: Internal Medicine

## 2021-04-26 ENCOUNTER — Other Ambulatory Visit: Payer: Self-pay

## 2021-04-26 ENCOUNTER — Encounter: Payer: Self-pay | Admitting: Radiology

## 2021-04-26 DIAGNOSIS — R0602 Shortness of breath: Secondary | ICD-10-CM | POA: Diagnosis present

## 2021-04-26 DIAGNOSIS — Z79891 Long term (current) use of opiate analgesic: Secondary | ICD-10-CM

## 2021-04-26 DIAGNOSIS — Z9981 Dependence on supplemental oxygen: Secondary | ICD-10-CM

## 2021-04-26 DIAGNOSIS — J470 Bronchiectasis with acute lower respiratory infection: Secondary | ICD-10-CM | POA: Diagnosis present

## 2021-04-26 DIAGNOSIS — J449 Chronic obstructive pulmonary disease, unspecified: Secondary | ICD-10-CM | POA: Insufficient documentation

## 2021-04-26 DIAGNOSIS — Z515 Encounter for palliative care: Secondary | ICD-10-CM | POA: Diagnosis not present

## 2021-04-26 DIAGNOSIS — Z7189 Other specified counseling: Secondary | ICD-10-CM | POA: Diagnosis not present

## 2021-04-26 DIAGNOSIS — Z66 Do not resuscitate: Secondary | ICD-10-CM | POA: Diagnosis not present

## 2021-04-26 DIAGNOSIS — Z7982 Long term (current) use of aspirin: Secondary | ICD-10-CM

## 2021-04-26 DIAGNOSIS — Z8249 Family history of ischemic heart disease and other diseases of the circulatory system: Secondary | ICD-10-CM | POA: Diagnosis not present

## 2021-04-26 DIAGNOSIS — G8929 Other chronic pain: Secondary | ICD-10-CM | POA: Diagnosis present

## 2021-04-26 DIAGNOSIS — J441 Chronic obstructive pulmonary disease with (acute) exacerbation: Secondary | ICD-10-CM | POA: Diagnosis not present

## 2021-04-26 DIAGNOSIS — J151 Pneumonia due to Pseudomonas: Secondary | ICD-10-CM | POA: Diagnosis present

## 2021-04-26 DIAGNOSIS — J439 Emphysema, unspecified: Secondary | ICD-10-CM | POA: Diagnosis present

## 2021-04-26 DIAGNOSIS — Z20822 Contact with and (suspected) exposure to covid-19: Secondary | ICD-10-CM | POA: Diagnosis present

## 2021-04-26 DIAGNOSIS — Z7951 Long term (current) use of inhaled steroids: Secondary | ICD-10-CM

## 2021-04-26 DIAGNOSIS — Y95 Nosocomial condition: Secondary | ICD-10-CM | POA: Diagnosis present

## 2021-04-26 DIAGNOSIS — J850 Gangrene and necrosis of lung: Secondary | ICD-10-CM | POA: Diagnosis present

## 2021-04-26 DIAGNOSIS — Z7952 Long term (current) use of systemic steroids: Secondary | ICD-10-CM

## 2021-04-26 DIAGNOSIS — J690 Pneumonitis due to inhalation of food and vomit: Secondary | ICD-10-CM | POA: Diagnosis present

## 2021-04-26 DIAGNOSIS — I2729 Other secondary pulmonary hypertension: Secondary | ICD-10-CM | POA: Diagnosis present

## 2021-04-26 DIAGNOSIS — I700 Atherosclerosis of aorta: Secondary | ICD-10-CM | POA: Diagnosis present

## 2021-04-26 DIAGNOSIS — J90 Pleural effusion, not elsewhere classified: Secondary | ICD-10-CM | POA: Diagnosis present

## 2021-04-26 DIAGNOSIS — Z87891 Personal history of nicotine dependence: Secondary | ICD-10-CM | POA: Diagnosis not present

## 2021-04-26 DIAGNOSIS — J9621 Acute and chronic respiratory failure with hypoxia: Secondary | ICD-10-CM | POA: Diagnosis present

## 2021-04-26 DIAGNOSIS — Z79899 Other long term (current) drug therapy: Secondary | ICD-10-CM

## 2021-04-26 DIAGNOSIS — J189 Pneumonia, unspecified organism: Secondary | ICD-10-CM

## 2021-04-26 HISTORY — DX: Other chronic pain: G89.29

## 2021-04-26 LAB — CBC WITH DIFFERENTIAL/PLATELET
Abs Immature Granulocytes: 0.07 10*3/uL (ref 0.00–0.07)
Basophils Absolute: 0 10*3/uL (ref 0.0–0.1)
Basophils Relative: 0 %
Eosinophils Absolute: 0 10*3/uL (ref 0.0–0.5)
Eosinophils Relative: 0 %
HCT: 35.4 % — ABNORMAL LOW (ref 39.0–52.0)
Hemoglobin: 11.4 g/dL — ABNORMAL LOW (ref 13.0–17.0)
Immature Granulocytes: 1 %
Lymphocytes Relative: 8 %
Lymphs Abs: 1.1 10*3/uL (ref 0.7–4.0)
MCH: 27.9 pg (ref 26.0–34.0)
MCHC: 32.2 g/dL (ref 30.0–36.0)
MCV: 86.6 fL (ref 80.0–100.0)
Monocytes Absolute: 0.8 10*3/uL (ref 0.1–1.0)
Monocytes Relative: 6 %
Neutro Abs: 11.8 10*3/uL — ABNORMAL HIGH (ref 1.7–7.7)
Neutrophils Relative %: 85 %
Platelets: 325 10*3/uL (ref 150–400)
RBC: 4.09 MIL/uL — ABNORMAL LOW (ref 4.22–5.81)
RDW: 15.2 % (ref 11.5–15.5)
WBC: 13.7 10*3/uL — ABNORMAL HIGH (ref 4.0–10.5)
nRBC: 0 % (ref 0.0–0.2)

## 2021-04-26 LAB — LACTIC ACID, PLASMA
Lactic Acid, Venous: 0.9 mmol/L (ref 0.5–1.9)
Lactic Acid, Venous: 1.2 mmol/L (ref 0.5–1.9)

## 2021-04-26 LAB — RESP PANEL BY RT-PCR (FLU A&B, COVID) ARPGX2
Influenza A by PCR: NEGATIVE
Influenza B by PCR: NEGATIVE
SARS Coronavirus 2 by RT PCR: NEGATIVE

## 2021-04-26 LAB — PROTIME-INR
INR: 1.2 (ref 0.8–1.2)
Prothrombin Time: 15.4 seconds — ABNORMAL HIGH (ref 11.4–15.2)

## 2021-04-26 LAB — BASIC METABOLIC PANEL
Anion gap: 8 (ref 5–15)
BUN: 22 mg/dL (ref 8–23)
CO2: 29 mmol/L (ref 22–32)
Calcium: 8.1 mg/dL — ABNORMAL LOW (ref 8.9–10.3)
Chloride: 96 mmol/L — ABNORMAL LOW (ref 98–111)
Creatinine, Ser: 0.6 mg/dL — ABNORMAL LOW (ref 0.61–1.24)
GFR, Estimated: >60 mL/min (ref >60)
Glucose, Bld: 90 mg/dL (ref 70–99)
Potassium: 3.8 mmol/L (ref 3.5–5.1)
Sodium: 133 mmol/L — ABNORMAL LOW (ref 135–145)

## 2021-04-26 LAB — TROPONIN I (HIGH SENSITIVITY): Troponin I (High Sensitivity): 8 ng/L (ref <18)

## 2021-04-26 LAB — APTT: aPTT: 25 seconds (ref 24–36)

## 2021-04-26 LAB — PROCALCITONIN: Procalcitonin: <0.1 ng/mL

## 2021-04-26 MED ORDER — HEPARIN SODIUM (PORCINE) 5000 UNIT/ML IJ SOLN: 5000.0000 [IU] | Freq: Three times a day (TID) | INTRAMUSCULAR | Status: DC

## 2021-04-26 MED ORDER — BUPRENORPHINE HCL-NALOXONE HCL 8-2 MG SL SUBL
1.0000 | SUBLINGUAL_TABLET | Freq: Three times a day (TID) | SUBLINGUAL | Status: DC
Start: 2021-04-26 — End: 2021-04-30
  Administered 2021-04-26 – 2021-04-30 (×12): 1 via SUBLINGUAL
  Filled 2021-04-26 (×12): qty 1

## 2021-04-26 MED ORDER — ENOXAPARIN SODIUM 40 MG/0.4ML IJ SOSY
40.0000 mg | PREFILLED_SYRINGE | INTRAMUSCULAR | Status: DC
  Administered 2021-04-26 – 2021-04-29 (×4): 40 mg via SUBCUTANEOUS
  Filled 2021-04-26 (×4): qty 0.4

## 2021-04-26 MED ORDER — IPRATROPIUM-ALBUTEROL 0.5-2.5 (3) MG/3ML IN SOLN
RESPIRATORY_TRACT | Status: AC
  Administered 2021-04-26: 3 mL via RESPIRATORY_TRACT
  Filled 2021-04-26: qty 3

## 2021-04-26 MED ORDER — METRONIDAZOLE 500 MG/100ML IV SOLN
500.0000 mg | Freq: Three times a day (TID) | INTRAVENOUS | Status: DC
  Filled 2021-04-26: qty 100

## 2021-04-26 MED ORDER — ALBUTEROL SULFATE (2.5 MG/3ML) 0.083% IN NEBU
2.5000 mg | INHALATION_SOLUTION | RESPIRATORY_TRACT | Status: DC | PRN
Start: 2021-04-26 — End: 2021-04-30

## 2021-04-26 MED ORDER — VANCOMYCIN HCL 750 MG/150ML IV SOLN: 750.0000 mg | Freq: Two times a day (BID) | INTRAVENOUS | Status: DC

## 2021-04-26 MED ORDER — LACTATED RINGERS IV SOLN: INTRAVENOUS | Status: AC

## 2021-04-26 MED ORDER — ACETAMINOPHEN 325 MG PO TABS: 650.0000 mg | ORAL_TABLET | Freq: Four times a day (QID) | ORAL | Status: DC | PRN

## 2021-04-26 MED ORDER — IPRATROPIUM-ALBUTEROL 0.5-2.5 (3) MG/3ML IN SOLN
3.0000 mL | RESPIRATORY_TRACT | Status: DC
  Administered 2021-04-27 – 2021-04-28 (×7): 3 mL via RESPIRATORY_TRACT
  Filled 2021-04-26 (×7): qty 3

## 2021-04-26 MED ORDER — ONDANSETRON HCL 4 MG/2ML IJ SOLN: 4.0000 mg | Freq: Three times a day (TID) | INTRAMUSCULAR | Status: DC | PRN

## 2021-04-26 MED ORDER — IOHEXOL 300 MG/ML  SOLN
75.0000 mL | Freq: Once | INTRAMUSCULAR | Status: AC | PRN
  Administered 2021-04-26: 75 mL via INTRAVENOUS

## 2021-04-26 MED ORDER — SODIUM CHLORIDE 0.9 % IV SOLN
2.0000 g | Freq: Three times a day (TID) | INTRAVENOUS | Status: DC
  Administered 2021-04-26 – 2021-04-30 (×12): 2 g via INTRAVENOUS
  Filled 2021-04-26 (×15): qty 2

## 2021-04-26 MED ORDER — VANCOMYCIN HCL IN DEXTROSE 1-5 GM/200ML-% IV SOLN
1000.0000 mg | Freq: Once | INTRAVENOUS | Status: DC
  Filled 2021-04-26: qty 200

## 2021-04-26 MED ORDER — SODIUM CHLORIDE 0.9 % IV SOLN
2.0000 g | Freq: Once | INTRAVENOUS | Status: AC
  Administered 2021-04-26: 2 g via INTRAVENOUS
  Filled 2021-04-26: qty 2

## 2021-04-26 MED ORDER — VANCOMYCIN HCL 1250 MG/250ML IV SOLN
1250.0000 mg | Freq: Once | INTRAVENOUS | Status: AC
  Administered 2021-04-26: 1250 mg via INTRAVENOUS
  Filled 2021-04-26: qty 250

## 2021-04-26 MED ORDER — DM-GUAIFENESIN ER 30-600 MG PO TB12
1.0000 | ORAL_TABLET | Freq: Two times a day (BID) | ORAL | Status: DC | PRN
  Administered 2021-04-30: 1 via ORAL
  Filled 2021-04-26: qty 1

## 2021-04-26 MED ORDER — METHYLPREDNISOLONE SODIUM SUCC 40 MG IJ SOLR
40.0000 mg | Freq: Two times a day (BID) | INTRAMUSCULAR | Status: DC
  Administered 2021-04-26 – 2021-04-30 (×8): 40 mg via INTRAVENOUS
  Filled 2021-04-26 (×8): qty 1

## 2021-04-26 MED ORDER — METRONIDAZOLE 500 MG PO TABS
500.0000 mg | ORAL_TABLET | Freq: Three times a day (TID) | ORAL | Status: DC
  Administered 2021-04-26 – 2021-04-29 (×10): 500 mg via ORAL
  Filled 2021-04-26 (×13): qty 1

## 2021-04-26 MED ORDER — KETOROLAC TROMETHAMINE 30 MG/ML IJ SOLN
15.0000 mg | Freq: Once | INTRAMUSCULAR | Status: AC
  Administered 2021-04-26: 15 mg via INTRAVENOUS
  Filled 2021-04-26: qty 1

## 2021-04-26 MED ORDER — BUPRENORPHINE HCL-NALOXONE HCL 8-2 MG SL SUBL: 1.0000 | SUBLINGUAL_TABLET | Freq: Every day | SUBLINGUAL | Status: DC

## 2021-04-26 MED ORDER — SODIUM CHLORIDE 0.9 % IV BOLUS (SEPSIS)
1000.0000 mL | Freq: Once | INTRAVENOUS | Status: AC
  Administered 2021-04-26: 1000 mL via INTRAVENOUS

## 2021-04-26 MED ORDER — BUPRENORPHINE HCL-NALOXONE HCL 8-2 MG SL SUBL
1.0000 | SUBLINGUAL_TABLET | Freq: Three times a day (TID) | SUBLINGUAL | Status: DC
  Administered 2021-04-26: 1 via SUBLINGUAL
  Filled 2021-04-26: qty 1

## 2021-04-26 NOTE — Progress Notes (Signed)
PHARMACY -  BRIEF ANTIBIOTIC NOTE   Pharmacy has received consult(s) for vancomycin and cefepime from an ED provider.  The patient's profile has been reviewed for ht/wt/allergies/indication/available labs.    One time order(s) placed for:   1) cefepime 2 grams IV x 1  2) vancomycin 1250 mg IV x 1  Further antibiotics/pharmacy consults should be ordered by admitting physician if indicated.                       Thank you, Lowella Bandy 04/26/2021  1:20 PM

## 2021-04-26 NOTE — ED Notes (Signed)
Po fluids provided, thick yellow sputum sent to lab, awaiting ready bed, no other needs at this time

## 2021-04-26 NOTE — ED Notes (Signed)
Pt at CT

## 2021-04-26 NOTE — H&P (Signed)
History and Physical    Frank Calderon YKD:[id number] DOB: 1951/03/22 DOA: 04/26/2021  Referring MD/NP/PA:   PCP: [facility] [employer]   Patient coming from:  The patient is coming from home.  At baseline, pt is independent for most of ADL.        Chief Complaint: SOB and chest pain  HPI: Frank Calderon is a 70 y.o. male with medical history significant of COPD on 2L O2, chronic pain on Suboxone, who presents with SOB.   Patient was recently hospitalized from 5/11 to 5/15 due to CAP and COPD exacerbation.  Patient was discharged on Levaquin and 2 L oxygen.  Sputum culture on 04/19/2021 was positive for Pseudomonas.  Patient states that he has been having progressively worsening shortness in the past 5 days.  He also has pleuritic chest pain, which is located in the left side of chest, constant, sharp, nonradiating, aggravated by deep breath and coughing.  He has productive cough with yellow-colored mucus production.  Denies fever or chills.  No nausea, vomiting, diarrhea or abdominal pain.  No symptoms of UTI. Patient is on 2 L oxygen at home, now needing 3 L oxygen to have oxygen saturation 93%.  Patient states that he has been taking Suboxone 3 times per day for chronic pain in knees and neck for many years.  ED Course: pt was found to have WBC 13.9, troponin level 8, electrolytes renal function okay, temperature normal, blood pressure 107/58, heart rate 90, 74, RR 18.  Patient is admitted to progressive bed as inpatient.  Dr. Luciana Axe of ID is consulted.  CT of chest: 1. Necrotic pneumonia likely due to aspiration in the LEFT lower lobe with multiple fluid filled cavities superimposed on a background of pulmonary emphysema. 2. Associated LEFT-sided pleural effusion is very small volume and without current pleural enhancement. The patient would be at risk for developing empyema, therefore would suggest attention on follow-up. 3. Pneumonitis at the RIGHT lung base as well  without frank consolidative changes. 4. Nodal enlargement in the chest likely reactive, consider three-month follow-up to ensure resolution of above findings and assess lymph nodes. 5. Severe pulmonary emphysema. 6. Emphysema and aortic atherosclerosis.  Aortic Atherosclerosis (ICD10-I70.0) and Emphysema (ICD10-J43.9).    Review of Systems:   General: no fevers, chills, no body weight gain, has fatigue HEENT: no blurry vision, hearing changes or sore throat Respiratory: has dyspnea, coughing, wheezing CV: has chest pain, no palpitations GI: no nausea, vomiting, abdominal pain, diarrhea, constipation GU: no dysuria, burning on urination, increased urinary frequency, hematuria  Ext: no leg edema Neuro: no unilateral weakness, numbness, or tingling, no vision change or hearing loss Skin: no rash, no skin tear. MSK: No muscle spasm, no deformity, no limitation of range of movement in spin. Has chronic knee and neck pain Heme: No easy bruising.  Travel history: No recent long distant travel.  Allergy: No Known Allergies  Past Medical History:  Diagnosis Date  . Chronic pain   . COPD (chronic obstructive pulmonary disease) (HCC)     Past Surgical History:  Procedure Laterality Date  . APPENDECTOMY    . Bilateral knee surgery    . CERVICAL SPINE SURGERY      Social History:  reports that he has quit smoking. He has never used smokeless tobacco. He reports previous alcohol use. He reports that he does not use drugs.  Family History:  Family History  Problem Relation Age of Onset  . Hypertension Mother  Prior to Admission medications   Medication Sig Start Date End Date Taking? Authorizing Provider  albuterol (VENTOLIN HFA) 108 (90 Base) MCG/ACT inhaler Inhale 2 puffs into the lungs every 6 (six) hours as needed. 07/09/20   [provider]  aspirin 325 MG tablet Take 325 mg by mouth daily as needed. 08/02/19   [provider]  buprenorphine-naloxone  (SUBOXONE) 8-2 mg SUBL SL tablet Place 1 tablet under the tongue daily. 04/12/21   [provider]  fluticasone-salmeterol (ADVAIR) 250-50 MCG/ACT AEPB 1 puff in the morning and at bedtime. 07/22/20   [provider]  gabapentin (NEURONTIN) 600 MG tablet Take 600 mg by mouth 3 (three) times daily. 04/08/21   [provider]  guaiFENesin (MUCINEX) 600 MG 12 hr tablet Take 2 tablets (1,200 mg total) by mouth 2 (two) times daily. 04/21/21   Rhetta Mura, MD  ibuprofen (ADVIL) 400 MG tablet Take 1 tablet (400 mg total) by mouth 3 (three) times daily. 04/21/21   Rhetta Mura, MD  ipratropium-albuterol (DUONEB) 0.5-2.5 (3) MG/3ML SOLN Inhale 3 mLs into the lungs every 6 (six) hours as needed. 07/26/20   [provider]  levofloxacin (LEVAQUIN) 500 MG tablet Take 1 tablet (500 mg total) by mouth daily. 04/22/21   Rhetta Mura, MD  predniSONE (DELTASONE) 50 MG tablet Take 1 tab daily then stop 04/21/21   Rhetta Mura, MD  TRELEGY ELLIPTA 100-62.5-25 MCG/INH AEPB Inhale 1 puff into the lungs daily. 10/26/20   [provider]    Physical Exam: Vitals:   04/26/21 1245 04/26/21 1417 04/26/21 1618 04/26/21 1630  BP: 103/63 (!) 107/58 96/60 (!) 105/56  Pulse: 83 74 80 86  Resp: Temp:   98.2 F (36.8 C)   TempSrc:   Oral   SpO2: 94% 93% 100% 97%  Weight:      Height:       General: Not in acute distress HEENT:       Eyes: PERRL, EOMI, no scleral icterus.       ENT: No discharge from the ears and nose, no pharynx injection, no tonsillar enlargement.        Neck: No JVD, no bruit, no mass felt. Heme: No neck lymph node enlargement. Cardiac: S1/S2, RRR, No murmurs, No gallops or rubs. Respiratory: Has wheezing and rhonchi bilaterally GI: Soft, nondistended, nontender, no rebound pain, no organomegaly, BS present. GU: No hematuria Ext: No pitting leg edema bilaterally. 1+DP/PT pulse bilaterally. Musculoskeletal: No joint  deformities, No joint redness or warmth, no limitation of ROM in spin. Skin: No rashes.  Neuro: Alert, oriented X3, cranial nerves II-XII grossly intact, moves all extremities normally. Psych: Patient is not psychotic, no suicidal or hemocidal ideation.  Labs on Admission: I have personally reviewed following labs and imaging studies  CBC: Recent Labs  Lab 04/21/21 0858 04/26/21 1132  WBC 18.4* 13.7*  NEUTROABS 16.2* 11.8*  HGB 10.5* 11.4*  HCT 32.5* 35.4*  MCV 87.4 86.6  PLT 305 325   Basic Metabolic Panel: Recent Labs  Lab 04/20/21 0815 04/26/21 1132  NA 134* 133*  K 3.9 3.8  CL 102 96*  CO2 27 29  GLUCOSE 104* 90  BUN 8 22  CREATININE 0.50* 0.60*  CALCIUM 7.8* 8.1*   GFR: Estimated Creatinine Clearance: 79.4 mL/min (A) (by C-G formula based on SCr of 0.6 mg/dL (L)). Liver Function Tests: No results for input(s): AST, ALT, ALKPHOS, BILITOT, PROT, ALBUMIN in the last 168 hours. No results for  input(s): LIPASE, AMYLASE in the last 168 hours. No results for input(s): AMMONIA in the last 168 hours. Coagulation Profile: Recent Labs  Lab 04/26/21 1608  INR 1.2   Cardiac Enzymes: No results for input(s): CKTOTAL, CKMB, CKMBINDEX, TROPONINI in the last 168 hours. BNP (last 3 results) No results for input(s): PROBNP in the last 8760 hours. HbA1C: No results for input(s): HGBA1C in the last 72 hours. CBG: No results for input(s): GLUCAP in the last 168 hours. Lipid Profile: No results for input(s): CHOL, HDL, LDLCALC, TRIG, CHOLHDL, LDLDIRECT in the last 72 hours. Thyroid Function Tests: No results for input(s): TSH, T4TOTAL, FREET4, T3FREE, THYROIDAB in the last 72 hours. Anemia Panel: No results for input(s): VITAMINB12, FOLATE, FERRITIN, TIBC, IRON, RETICCTPCT in the last 72 hours. Urine analysis: No results found for: COLORURINE, APPEARANCEUR, LABSPEC, PHURINE, GLUCOSEU, HGBUR, BILIRUBINUR, KETONESUR, PROTEINUR, UROBILINOGEN, NITRITE, LEUKOCYTESUR Sepsis  Labs: @LABRCNTIP (procalcitonin:4,lacticidven:4) ) Recent Results (from the past 240 hour(s))  Resp Panel by RT-PCR (Flu A&B, Covid) Nasopharyngeal Swab     Status: None   Collection Time: 04/17/21  9:39 AM   Specimen: Nasopharyngeal Swab; Nasopharyngeal(NP) swabs in vial transport medium  Result Value Ref Range Status   SARS Coronavirus 2 by RT PCR NEGATIVE NEGATIVE Final    Comment: (NOTE) SARS-CoV-2 target nucleic acids are NOT DETECTED.  The SARS-CoV-2 RNA is generally detectable in upper respiratory specimens during the acute phase of infection. The lowest concentration of SARS-CoV-2 viral copies this assay can detect is 138 copies/mL. A negative result does not preclude SARS-Cov-2 infection and should not be used as the sole basis for treatment or other patient management decisions. A negative result may occur with  improper specimen collection/handling, submission of specimen other than nasopharyngeal swab, presence of viral mutation(s) within the areas targeted by this assay, and inadequate number of viral copies(<138 copies/mL). A negative result must be combined with clinical observations, patient history, and epidemiological information. The expected result is Negative.  Fact Sheet for Patients:  06/17/21  Fact Sheet for Healthcare Providers:  BloggerCourse.com  This test is no t yet approved or cleared by the [place] FDA and  has been authorized for detection and/or diagnosis of SARS-CoV-2 by FDA under an Emergency Use Authorization (EUA). This EUA will remain  in effect (meaning this test can be used) for the duration of the COVID-19 declaration under Section 564(b)(1) of the Act, 21 U.S.C.section 360bbb-3(b)(1), unless the authorization is terminated  or revoked sooner.       Influenza A by PCR NEGATIVE NEGATIVE Final   Influenza B by PCR NEGATIVE NEGATIVE Final    Comment: (NOTE) The Xpert Xpress  SARS-CoV-2/FLU/RSV plus assay is intended as an aid in the diagnosis of influenza from Nasopharyngeal swab specimens and should not be used as a sole basis for treatment. Nasal washings and aspirates are unacceptable for Xpert Xpress SARS-CoV-2/FLU/RSV testing.  Fact Sheet for Patients: [place]  Fact Sheet for Healthcare Providers: BloggerCourse.com  This test is not yet approved or cleared by the [place] FDA and has been authorized for detection and/or diagnosis of SARS-CoV-2 by FDA under an Emergency Use Authorization (EUA). This EUA will remain in effect (meaning this test can be used) for the duration of the COVID-19 declaration under Section 564(b)(1) of the Act, 21 U.S.C. section 360bbb-3(b)(1), unless the authorization is terminated or revoked.  Performed at [facility], [address], [place], [place]   Culture, blood (single)     Status: None  Collection Time: 04/17/21 11:18 AM   Specimen: BLOOD  Result Value Ref Range Status   Specimen Description BLOOD LEFT ANTECUBITAL  Final   Special Requests   Final    BOTTLES DRAWN AEROBIC AND ANAEROBIC Blood Culture adequate volume   Culture   Final    NO GROWTH 5 DAYS Performed at [facility], [address] [place], [place] [address]    Report Status 04/22/2021 FINAL  Final  Expectorated Sputum Assessment w Gram Stain, Rflx to Resp Cult     Status: None   Collection Time: 04/19/21 11:36 AM   Specimen: Sputum  Result Value Ref Range Status   Specimen Description SPUTUM  Final   Special Requests SPUTUM  Final   Sputum evaluation   Final    THIS SPECIMEN IS ACCEPTABLE FOR SPUTUM CULTURE Performed at [facility], [address] [place], [place] [address]    Report Status 04/19/2021 FINAL  Final  Culture, Respiratory w Gram Stain     Status: None   Collection Time: 04/19/21 11:36 AM   Specimen: SPU  Result Value  Ref Range Status   Specimen Description   Final    SPUTUM Performed at [facility], [address] [place], [place] [address]    Special Requests   Final    SPUTUM Reflexed from [id number] Performed at [facility], [address] [place], [place] [address]    Gram Stain   Final    ABUNDANT WBC PRESENT, PREDOMINANTLY PMN NO ORGANISMS SEEN Performed at [facility], [address] [place], [place] [address]    Culture FEW PSEUDOMONAS AERUGINOSA  Final   Report Status 04/22/2021 FINAL  Final   Organism ID, Bacteria PSEUDOMONAS AERUGINOSA  Final      Susceptibility   Pseudomonas aeruginosa - MIC*    CEFTAZIDIME 4 SENSITIVE Sensitive     CIPROFLOXACIN <=0.25 SENSITIVE Sensitive     GENTAMICIN <=1 SENSITIVE Sensitive     IMIPENEM 1 SENSITIVE Sensitive     PIP/TAZO 8 SENSITIVE Sensitive     CEFEPIME 2 SENSITIVE Sensitive     * FEW PSEUDOMONAS AERUGINOSA  Resp Panel by RT-PCR (Flu A&B, Covid) Nasopharyngeal Swab     Status: None   Collection Time: 04/26/21  4:08 PM   Specimen: Nasopharyngeal Swab; Nasopharyngeal(NP) swabs in vial transport medium  Result Value Ref Range Status   SARS Coronavirus 2 by RT PCR NEGATIVE NEGATIVE Final    Comment: (NOTE) SARS-CoV-2 target nucleic acids are NOT DETECTED.  The SARS-CoV-2 RNA is generally detectable in upper respiratory specimens during the acute phase of infection. The lowest concentration of SARS-CoV-2 viral copies this assay can detect is 138 copies/mL. A negative result does not preclude SARS-Cov-2 infection and should not be used as the sole basis for treatment or other patient management decisions. A negative result may occur with  improper specimen collection/handling, submission of specimen other than nasopharyngeal swab, presence of viral mutation(s) within the areas targeted by this assay, and inadequate number of viral copies(<138 copies/mL). A negative result must be combined with clinical  observations, patient history, and epidemiological information. The expected result is Negative.  Fact Sheet for Patients:  BloggerCourse.comhttps://www.fda.gov/media/152166/download  Fact Sheet for Healthcare Providers:  SeriousBroker.ithttps://www.fda.gov/media/152162/download  This test is no t yet approved or cleared by the [place] FDA and  has been authorized for detection and/or diagnosis of SARS-CoV-2 by FDA under an Emergency Use Authorization (EUA). This EUA will remain  in effect (meaning this  test can be used) for the duration of the COVID-19 declaration under Section 564(b)(1) of the Act, 21 U.S.C.section 360bbb-3(b)(1), unless the authorization is terminated  or revoked sooner.       Influenza A by PCR NEGATIVE NEGATIVE Final   Influenza B by PCR NEGATIVE NEGATIVE Final    Comment: (NOTE) The Xpert Xpress SARS-CoV-2/FLU/RSV plus assay is intended as an aid in the diagnosis of influenza from Nasopharyngeal swab specimens and should not be used as a sole basis for treatment. Nasal washings and aspirates are unacceptable for Xpert Xpress SARS-CoV-2/FLU/RSV testing.  Fact Sheet for Patients: BloggerCourse.com  Fact Sheet for Healthcare Providers: SeriousBroker.it  This test is not yet approved or cleared by the [place] FDA and has been authorized for detection and/or diagnosis of SARS-CoV-2 by FDA under an Emergency Use Authorization (EUA). This EUA will remain in effect (meaning this test can be used) for the duration of the COVID-19 declaration under Section 564(b)(1) of the Act, 21 U.S.C. section 360bbb-3(b)(1), unless the authorization is terminated or revoked.  Performed at [facility], [address] [place], [place] [address]   Culture, sputum-assessment     Status: None (Preliminary result)   Collection Time: 04/26/21  4:08 PM   Specimen: Nasopharyngeal Swab; Sputum  Result Value Ref Range Status   Specimen  Description EXPECTORATED SPUTUM  Final   Special Requests NONE  Final   Sputum evaluation   Final    THIS SPECIMEN IS ACCEPTABLE FOR SPUTUM CULTURE Performed at [facility], [address], [place], [place] [address]    Report Status PENDING  Incomplete     Radiological Exams on Admission: DG Chest 2 View  Result Date: 04/26/2021 CLINICAL DATA:  Shortness of breath.  History of pneumonia. EXAM: CHEST - 2 VIEW COMPARISON:  Chest x-ray 04/17/2021 FINDINGS: The cardiac silhouette, mediastinal and hilar contours are within normal limits is stable. There is mild tortuosity and calcification of the thoracic aorta. Persistent extensive left lower lobe airspace process. Underlying emphysematous changes are noted but could not exclude cavitary process in the left lower lobe. Findings could suggest cavitary pneumonia or lung abscesses. Tumor would be another possibility. Recommend chest CT for further evaluation. The right lung remains relatively clear. IMPRESSION: Persistent extensive left lower lobe airspace process. Findings could suggest cavitary pneumonia or lung abscesses. Neoplastic process would be another possibility. Recommend chest CT with contrast for further evaluation. Electronically Signed   By: Rudie Meyer M.D.   On: 04/26/2021 12:26   CT Chest W Contrast  Result Date: 04/26/2021 CLINICAL DATA:  Pneumonia in a 70 year old male. EXAM: CT CHEST WITH CONTRAST TECHNIQUE: Multidetector CT imaging of the chest was performed during intravenous contrast administration. CONTRAST:  67mL OMNIPAQUE IOHEXOL 300 MG/ML  SOLN COMPARISON:  Chest x-ray of Apr 26, 2021. FINDINGS: Cardiovascular: Heart size normal. No substantial pericardial effusion. Aortic valvular calcifications. Aortic atherosclerosis. Normal caliber central pulmonary vessels. Mediastinum/Nodes: Esophagus mildly patulous. RIGHT paratracheal lymph node measuring 1 cm. Scattered nodes elsewhere throughout the chest, largest is a  subcarinal lymph node measuring 1.5 cm short axis (image 80/2) no axillary lymphadenopathy. No supraclavicular adenopathy. Precarinal lymph node on image 65/2 measuring 1 cm. Mild enlargement of RIGHT hilar nodal tissue at 11 mm. Fullness of LEFT hilar nodal tissue as well, largest measuring approximately 11 mm (image 82/2) Lungs/Pleura: Severe pulmonary emphysema. Nodular opacities at the RIGHT lung base. Material within lower lobe bronchi bilaterally. Some ground-glass and septal thickening in the lingula. Dense basilar consolidation  in the LEFT chest with multiple air-fluid levels involving the entire LEFT lower lobe aside from a portion of the superior segment. Trace LEFT-sided effusion. Upper Abdomen: Incidental imaging of upper abdominal contents without acute process. Musculoskeletal: No acute musculoskeletal finding. No destructive bone process. IMPRESSION: 1. Necrotic pneumonia likely due to aspiration in the LEFT lower lobe with multiple fluid filled cavities superimposed on a background of pulmonary emphysema. 2. Associated LEFT-sided pleural effusion is very small volume and without current pleural enhancement. The patient would be at risk for developing empyema, therefore would suggest attention on follow-up. 3. Pneumonitis at the RIGHT lung base as well without frank consolidative changes. 4. Nodal enlargement in the chest likely reactive, consider three-month follow-up to ensure resolution of above findings and assess lymph nodes. 5. Severe pulmonary emphysema. 6. Emphysema and aortic atherosclerosis. Aortic Atherosclerosis (ICD10-I70.0) and Emphysema (ICD10-J43.9). Electronically Signed   By: Donzetta Kohut M.D.   On: 04/26/2021 14:28     EKG: I have personally reviewed.  Sinus rhythm, QTC 442, no ischemic change.   Assessment/Plan Principal Problem:   Acute on chronic respiratory failure with hypoxia (HCC) Active Problems:   COPD with acute exacerbation (HCC)   HCAP  (healthcare-associated pneumonia)   Aspiration pneumonia (HCC)   Chronic pain   Acute on chronic respiratory failure with hypoxia due to necrotic pneumonia 2/2 possible aspiration pneumonia and HCAP: I discussed with Dr. Luciana Axe of infectious disease. He does not think we need other work-up at this moment.  He suggested 3-4 weeks of IV cefepime for Pseudomonas and reevaluated with chest x-ray after that.  He also recommended that it might be prudent to give oral Flagyl for 3-4 weeks in case the Pseudomonas is not actual pathogen.  If follow-up chest x-ray is not better, then would need pulmonology involvement/BAL. Dr. Luciana Axe does not think patient needs vancomycin.  Patient has leukocytosis with WBC 13.9, but does not meet criteria for sepsis.  Heart rate 70-90, RR 19 and temperature normal.  -Admitted to progressive unit as inpatient - IV cefepime, and oral flagyl per Dr. Luciana Axe.  - Mucinex for cough  - Bronchodilators - Solu-Medrol 40 mg twice daily - Incentive spirometry - Urine legionella and S. pneumococcal antigen - Follow up blood culture x2, sputum culture - will get Procalcitonin and lactic acid   Chronic pain: -Continue home Suboxone   DVT ppx: SQ Lovenox Code Status: Full code Family Communication: not done, no family member is at bed side.      Disposition Plan:  Anticipate discharge back to previous environment Consults called:  Dr. Luciana Axe of ID Admission status and Level of care: Progressive Cardiac:   as inpt   Status is: Inpatient  Remains inpatient appropriate because:Inpatient level of care appropriate due to severity of illness   Dispo: The patient is from: Home              Anticipated d/c is to: Home              Patient currently is not medically stable to d/c.   Difficult to place patient No          Date of Service 04/26/2021    Lorretta Harp [facility]   If 7PM-7AM, please contact night-coverage [email or web address] 04/26/2021, 5:17 PM

## 2021-04-26 NOTE — Progress Notes (Addendum)
Pharmacy Antibiotic Note  Frank Calderon is a 70 y.o. male admitted on 04/26/2021 with pneumonia. CT shows necrotic pneumonia likely due to aspiration in the LEFT lower lobe with multiple fluid filled cavities superimposed on a background of pulmonary emphysema. Pharmacy has been consulted for cefpime dosing. His renal function is stable and at apparent baseline. In the ED he received 1250 mg IV vancomycin and 2 grams IV cefepime.  Plan:  start cefepime 2 grams IV every 12 hours  Follow renal function for needed dose adjustments  Follow sputum culture for potential narrowing of antibiotics  Height: 5\' 10"  (177.8 cm) Weight: 63.5 kg (140 lb) IBW/kg (Calculated) : 73  Temp (24hrs), Avg:98.4 F (36.9 C), Min:98.4 F (36.9 C), Max:98.4 F (36.9 C)  Recent Labs  Lab 04/20/21 0815 04/21/21 0858 04/26/21 1132  WBC  --  18.4* 13.7*  CREATININE 0.50*  --  0.60*    Estimated Creatinine Clearance: 79.4 mL/min (A) (by C-G formula based on SCr of 0.6 mg/dL (L)).    No Known Allergies  Antimicrobials this admission: vancomycin 05/20 x 1 metronidazole 05/20 >> cefepime 05/20 >>   Microbiology results: 05/20 BCx: pending 05/20 Sputum: pending   Thank you for allowing pharmacy to be a part of this patient's care.  6/20 04/26/2021 3:32 PM

## 2021-04-26 NOTE — Sepsis Progress Note (Signed)
eLink is following this Code Sepsis. °

## 2021-04-26 NOTE — ED Provider Notes (Signed)
[facility] Emergency Department Provider Note   ____________________________________________   Event Date/Time   First MD Initiated Contact with Patient 04/26/21 1216     (approximate)  I have reviewed the triage vital signs and the nursing notes.   HISTORY  Chief Complaint Shortness of Breath    HPI Frank Calderon is a 70 y.o. male with below stated past medical history who presents for worsening shortness of breath and chest pain in the setting of diagnosis of pneumonia recently.  Patient states that he was recently discharged from the hospital on 04/21/2021 after a diagnosis of left-sided pneumonia.  Patient states that he has been taking his prescribed Levaquin on time as prescribed with the last dose being this morning without any improvement of his symptoms.  Patient is on 2 L nasal cannula at baseline and feels that his shortness of breath has been worsening over the last 5 days.  Patient currently denies any vision changes, tinnitus, difficulty speaking, facial droop, sore throat, abdominal pain, nausea/vomiting/diarrhea, dysuria, or weakness/numbness/paresthesias in any extremity         Past Medical History:  Diagnosis Date  . Chronic pain   . COPD (chronic obstructive pulmonary disease) [facility])     Patient Active Problem List   Diagnosis Date Noted  . Acute on chronic respiratory failure with hypoxia ([place]) 04/26/2021  . HCAP (healthcare-associated pneumonia) 04/26/2021  . COPD (chronic obstructive pulmonary disease) ([place])   . Aspiration pneumonia ([place])   . Chronic pain   . CAP (community acquired pneumonia) 04/17/2021  . Acute respiratory failure ([place]) 04/17/2021  . COPD with acute exacerbation ([place]) 04/17/2021    Past Surgical History:  Procedure Laterality Date  . APPENDECTOMY    . Bilateral knee surgery    . CERVICAL SPINE SURGERY      Prior to Admission medications   Medication Sig Start Date End Date Taking? Authorizing  Provider  albuterol (VENTOLIN HFA) 108 (90 Base) MCG/ACT inhaler Inhale 2 puffs into the lungs every 6 (six) hours as needed. 07/09/20  Yes [provider]  aspirin 325 MG tablet Take 325 mg by mouth daily as needed. 08/02/19  Yes [provider]  buprenorphine-naloxone (SUBOXONE) 8-2 mg SUBL SL tablet Place 1 tablet under the tongue daily. 04/12/21  Yes [provider]  ceFEPime (MAXIPIME) IVPB Inject 2 g into the vein every 8 (eight) hours for 17 days. Indication:  Pseudomonas necrotizing pneumonia First Dose: Yes Last Day of Therapy:  05/17/2021 Labs - Once weekly:  CBC/D and CMP Method of administration: IV Push Method of administration may be changed at the discretion of home infusion pharmacist based upon assessment of the patient and/or caregiver's ability to self-administer the medication ordered. 04/30/21 05/17/21 Yes Lorella Nimrod, MD  fluticasone-salmeterol (ADVAIR) 250-50 MCG/ACT AEPB 1 puff in the morning and at bedtime. 07/22/20  Yes [provider]  gabapentin (NEURONTIN) 600 MG tablet Take 600 mg by mouth 3 (three) times daily. 04/08/21  Yes [provider]  ipratropium-albuterol (DUONEB) 0.5-2.5 (3) MG/3ML SOLN Inhale 3 mLs into the lungs every 6 (six) hours as needed. 07/26/20  Yes [provider]  Multiple Vitamins-Minerals (CENTRUM SILVER 50+MEN PO) Take 1 tablet by mouth daily.   Yes [provider]  predniSONE (DELTASONE) 10 MG tablet Take 1 tablet (10 mg total) by mouth daily with breakfast. Take 2 tablets daily for next 2 days followed by 1 tablet daily for 3 days and then stop it 04/30/21  Yes Amin, [place],  MD  TRELEGY ELLIPTA 100-62.5-25 MCG/INH AEPB Inhale 1 puff into the lungs daily. 10/26/20  Yes [provider]  dextromethorphan-guaiFENesin (MUCINEX DM) 30-600 MG 12hr tablet Take 1 tablet by mouth 2 (two) times daily as needed for cough. 04/30/21   Lorella Nimrod, MD    Allergies Patient has no known  allergies.  Family History  Problem Relation Age of Onset  . Hypertension Mother     Social History Social History   Tobacco Use  . Smoking status: Former Research scientist (life sciences)  . Smokeless tobacco: Never Used  Substance Use Topics  . Alcohol use: Not Currently  . Drug use: Never    Review of Systems Constitutional: No fever/chills Eyes: No visual changes. ENT: No sore throat. Cardiovascular: Endorses chest pain. Respiratory: Endorses shortness of breath. Gastrointestinal: No abdominal pain.  No nausea, no vomiting.  No diarrhea. Genitourinary: Negative for dysuria. Musculoskeletal: Negative for acute arthralgias Skin: Negative for rash. Neurological: Negative for headaches, weakness/numbness/paresthesias in any extremity Psychiatric: Negative for suicidal ideation/homicidal ideation   ____________________________________________   PHYSICAL EXAM:  VITAL SIGNS: ED Triage Vitals [04/26/21 1129]  Enc Vitals Group     BP 110/65     Pulse Rate 90     Resp 18     Temp 98.4 F (36.9 C)     Temp Source Oral     SpO2 93 %     Weight 140 lb (63.5 kg)     Height _0  (1.778 m)     Head Circumference      Peak Flow      Pain Score 7     Pain Loc      Pain Edu?      Excl. in [place]?    Constitutional: Alert and oriented. Well appearing and in no acute distress. Eyes: Conjunctivae are normal. PERRL. Head: Atraumatic. Nose: No congestion/rhinnorhea. Mouth/Throat: Mucous membranes are moist. Neck: No stridor Cardiovascular: Grossly normal heart sounds.  Good peripheral circulation. Respiratory: Increased respiratory effort.  2 L nasal cannula in place.  Rhonchi over bilateral lower lung fields.  No retractions. Gastrointestinal: Soft and nontender. No distention. Musculoskeletal: No obvious deformities Neurologic:  Normal speech and language. No gross focal neurologic deficits are appreciated. Skin:  Skin is warm and dry. No rash noted. Psychiatric: Mood and affect are normal.  Speech and behavior are normal.  ____________________________________________   LABS (all labs ordered are listed, but only abnormal results are displayed)  Labs Reviewed  CBC WITH DIFFERENTIAL/PLATELET - Abnormal; Notable for the following components:      Result Value   WBC 13.7 (*)    RBC 4.09 (*)    Hemoglobin 11.4 (*)    HCT 35.4 (*)    Neutro Abs 11.8 (*)    All other components within normal limits  BASIC METABOLIC PANEL - Abnormal; Notable for the following components:   Sodium 133 (*)    Chloride 96 (*)    Creatinine, Ser 0.60 (*)    Calcium 8.1 (*)    All other components within normal limits  PROTIME-INR - Abnormal; Notable for the following components:   Prothrombin Time 15.4 (*)    All other components within normal limits  BASIC METABOLIC PANEL - Abnormal; Notable for the following components:   Sodium 134 (*)    Glucose, Bld 117 (*)    Creatinine, Ser 0.57 (*)    Calcium 7.6 (*)    All other components within normal limits  CBC - Abnormal; Notable for the following components:  WBC 11.9 (*)    RBC 3.43 (*)    Hemoglobin 9.6 (*)    HCT 29.4 (*)    RDW 15.6 (*)    All other components within normal limits  CULTURE, BLOOD (SINGLE)  RESP PANEL BY RT-PCR (FLU A&B, COVID) ARPGX2  EXPECTORATED SPUTUM ASSESSMENT W GRAM STAIN, RFLX TO RESP C  CULTURE, RESPIRATORY W GRAM STAIN  MRSA PCR SCREENING  LACTIC ACID, PLASMA  LACTIC ACID, PLASMA  APTT  PROCALCITONIN  STREP PNEUMONIAE URINARY ANTIGEN  LEGIONELLA PNEUMOPHILA SEROGP 1 UR AG  CBC  TROPONIN I (HIGH SENSITIVITY)  TROPONIN I (HIGH SENSITIVITY)  TROPONIN I (HIGH SENSITIVITY)   ____________________________________________  EKG  ED ECG REPORT I, Naaman Plummer, the attending physician, personally viewed and interpreted this ECG.  Date: 04/26/2021 EKG Time: 1134 Rate: 90 Rhythm: normal sinus rhythm QRS Axis: normal Intervals: normal ST/T Wave abnormalities: normal Narrative Interpretation: no  evidence of acute ischemia  ____________________________________________  RADIOLOGY  ED MD interpretation: 2 view x-ray of the chest shows persistent extensive left lower airspace process suggesting possible cavitary pneumonia, lung abscess, or neoplastic process and recommend a CT with contrast for further evaluation  Official radiology report(s): No results found.  ____________________________________________   PROCEDURES  Procedure(s) performed (including Critical Care):  .1-3 Lead EKG Interpretation Performed by: Naaman Plummer, MD Authorized by: Naaman Plummer, MD     Interpretation: normal     ECG rate:  69   ECG rate assessment: normal     Rhythm: sinus rhythm     Ectopy: none     Conduction: normal       ____________________________________________   INITIAL IMPRESSION / ASSESSMENT AND PLAN / ED COURSE  As part of my medical decision making, I reviewed the following data within the [place] notes reviewed and incorporated, Labs reviewed, EKG interpreted, Old chart reviewed, Radiograph reviewed and Notes from prior ED visits reviewed and incorporated        Presents with shortness of breath, cough, and malaise concerning for pneumonia.  DDx: PE, COPD exacerbation, Pneumothorax, TB, Atypical ACS, Esophageal Rupture, Toxic Exposure, Foreign Body Airway Obstruction.  Workup: CXR CBC, CMP, lactate, troponin  Given History, Exam, and Workup presentation most consistent with pneumonia.  Findings: Persistent left lower lobe airspace process concerning for multifocal pneumonia  Tx: Fluid bolus, empiric broad-spectrum antibiotics  Reassessment: As patient is continuing to require supplemental oxygenation for acute hypoxic respiratory failure, patient will require admission to the internal medicine service for further evaluation and management  Disposition: Admit      ____________________________________________   FINAL  CLINICAL IMPRESSION(S) / ED DIAGNOSES  Final diagnoses:  Necrotizing pneumonia [facility])     ED Discharge Orders         Ordered    Home infusion instructions - [facility]        04/30/21 0956    Method of administration may be changed at the discretion of home infusion pharmacist based upon assessment of the patient and/or caregiver's ability to self-administer the medication ordered        04/30/21 0956    Anaphylaxis Kit: Provided to treat any anaphylactic reaction to the medication being provided to the patient if First Dose or when requested by physician        04/30/21 0956    Change dressing on IV access line weekly and PRN        04/30/21 0956    Flush IV access with Sodium  Chloride 0.9% and Heparin 10 units/ml or 100 units/ml        04/30/21 0956    [facility] to provide Cath Flo 5m       Comments: Administer for PICC line occlusion and as ordered by physician for other access device issues.   [facility]/21 [facility] to adjust dose for Vancomycin, Aminoglycosides and other anti-infective therapies as requested by physician.        04/30/21 0956    ceFEPime (MAXIPIME) IVPB  Every 8 hours        04/30/21 0956    predniSONE (DELTASONE) 10 MG tablet  Daily with breakfast        04/30/21 0956    dextromethorphan-guaiFENesin (MUCINEX DM) 30-600 MG 12hr tablet  2 times daily PRN        04/30/21 0956    Increase activity slowly        04/30/21 0956    Diet - low sodium heart healthy        04/30/21 0956    Discharge instructions       Comments: It was pleasure taking care of you. You are being given IV antibiotics, please take it as directed, your home health services should be able to help you along with your family member. Please follow-up with infectious disease in 3 weeks.   04/30/21 0956           Note:  This document was prepared using Dragon voice recognition software and may include unintentional dictation errors.    BNaaman Plummer MD 04/30/21 [phone]

## 2021-04-26 NOTE — Sepsis Progress Note (Signed)
Notified provider of need to order lactic acid. ° °

## 2021-04-26 NOTE — ED Triage Notes (Signed)
Pt here via [facility] with SOB. Pt was dc on Sunday and has been SOB since. Pt was dx with pneumonia and has been on abx with no change. Pt on 2L BNC at home.

## 2021-04-26 NOTE — Progress Notes (Signed)
CODE SEPSIS - PHARMACY COMMUNICATION  **Broad Spectrum Antibiotics should be administered within 1 hour of Sepsis diagnosis**  Time Code Sepsis Called/Page Received: 1320  Antibiotics Ordered: vancomycin and cefepime  Time of 1st antibiotic administration: 1356  Additional action taken by pharmacy: none required  If necessary, Name of Provider/Nurse Contacted: N/A    Lowella Bandy ,PharmD Clinical Pharmacist  04/26/2021  1:20 PM

## 2021-04-27 DIAGNOSIS — J850 Gangrene and necrosis of lung: Secondary | ICD-10-CM | POA: Diagnosis not present

## 2021-04-27 DIAGNOSIS — J9621 Acute and chronic respiratory failure with hypoxia: Secondary | ICD-10-CM | POA: Diagnosis not present

## 2021-04-27 DIAGNOSIS — J449 Chronic obstructive pulmonary disease, unspecified: Secondary | ICD-10-CM

## 2021-04-27 LAB — LEGIONELLA PNEUMOPHILA SEROGP 1 UR AG: L. pneumophila Serogp 1 Ur Ag: NEGATIVE

## 2021-04-27 LAB — CBC
HCT: 29.4 % — ABNORMAL LOW (ref 39.0–52.0)
Hemoglobin: 9.6 g/dL — ABNORMAL LOW (ref 13.0–17.0)
MCH: 28 pg (ref 26.0–34.0)
MCHC: 32.7 g/dL (ref 30.0–36.0)
MCV: 85.7 fL (ref 80.0–100.0)
Platelets: 278 10*3/uL (ref 150–400)
RBC: 3.43 MIL/uL — ABNORMAL LOW (ref 4.22–5.81)
RDW: 15.6 % — ABNORMAL HIGH (ref 11.5–15.5)
WBC: 11.9 10*3/uL — ABNORMAL HIGH (ref 4.0–10.5)
nRBC: 0 % (ref 0.0–0.2)

## 2021-04-27 LAB — BASIC METABOLIC PANEL
Anion gap: 5 (ref 5–15)
BUN: 19 mg/dL (ref 8–23)
CO2: 26 mmol/L (ref 22–32)
Calcium: 7.6 mg/dL — ABNORMAL LOW (ref 8.9–10.3)
Chloride: 103 mmol/L (ref 98–111)
Creatinine, Ser: 0.57 mg/dL — ABNORMAL LOW (ref 0.61–1.24)
GFR, Estimated: >60 mL/min (ref >60)
Glucose, Bld: 117 mg/dL — ABNORMAL HIGH (ref 70–99)
Potassium: 3.7 mmol/L (ref 3.5–5.1)
Sodium: 134 mmol/L — ABNORMAL LOW (ref 135–145)

## 2021-04-27 LAB — EXPECTORATED SPUTUM ASSESSMENT W GRAM STAIN, RFLX TO RESP C

## 2021-04-27 LAB — STREP PNEUMONIAE URINARY ANTIGEN: Strep Pneumo Urinary Antigen: NEGATIVE

## 2021-04-27 MED ORDER — SODIUM CHLORIDE 0.9 % IV SOLN
INTRAVENOUS | Status: DC | PRN
  Administered 2021-04-27: 500 mL via INTRAVENOUS

## 2021-04-27 NOTE — Consult Note (Signed)
Reason for Consult: Necrotizing pneumonia left lower lobe Referring Physician: Tilman Neat Amin,MD   Frank Calderon is an 70 y.o. male.  HPI:  The patient is a 70 year old former smoker (quit 10 months, 50-pack-years) who presented to [facility] on 20 May with increasing shortness of breath after recent hospitalization.  The patient had been previously admitted between 11 May through 15 May due to community-acquired pneumonia and COPD exacerbation.  He was discharged on Levaquin and oxygen at 2 L/min.  Sputum culture on 13 May was positive for Pseudomonas aeruginosa which was quite pansensitive.  Over the days after his initial discharge, he noted increasing shortness of breath and pleuritic left-sided chest pain.  The patient's oxygen requirements had increased from 2 L/min to 3 L/min to maintain saturations of 93%.  Evaluation in the emergency room showed that he had a white count of 13.9.  A CT of the chest showed necrotizing pneumonia likely due to aspiration with multiple air-fluid levels likely representing infected bullae from emphysema.  There is a very small left-sided pleural effusion as well.  The patient is also noted to have mediastinal adenopathy likely to be reactive.  Patient has very severe pulmonary emphysema noted on CT.  Patient underwent evaluation at [facility] pulmonary medicine in August 2021.  He saw Dr. Dudley Major.  PFTs performed at that time (07/23/2020) showed an FEV1 of 0.59 L with an FEV1/FVC of 25% and a diffusion capacity of 36%.  This is consistent with COPD GOLD criteria stage IV.  He did not have lung volumes performed at that time, he was referred to pulmonary rehab.  He was placed on Trelegy.  Patient at that time did not qualify for oxygen supplementation.  Patient also had a CT scan of the chest performed and was noted to have severe emphysema, bronchiectasis and mediastinal adenopathy.  Follow-up of the mediastinal adenopathy was proposed in 3 months from that visit.  An echocardiogram  performed 06/05/2020 during an admission at [facility] showed LVEF of 60 to 65% mild aortic regurgitation and moderate pulmonary hypertension with estimated pulmonary artery pressure 49 mmHg.  Based on the BODE index performed at that time his 4-year chance of survival was calculated at 18%.  Past Medical History:  Diagnosis Date  . Chronic pain   . COPD (chronic obstructive pulmonary disease) (HCC)     Past Surgical History:  Procedure Laterality Date  . APPENDECTOMY    . Bilateral knee surgery    . CERVICAL SPINE SURGERY      Family History  Problem Relation Age of Onset  . Hypertension Mother     Social History:  Social History   Tobacco Use  . Smoking status: Former Games developer  . Smokeless tobacco: Never Used  Substance Use Topics  . Alcohol use: Not Currently  Quit smoking approximately 10 months ago.  50-pack-year history total.  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  Suffered injuries from a fall from a helicopter.  On chronic opioids since then.  Disabled.  Patient is widowed.  Allergies: No Known Allergies  Medications:  Prior to Admission:  Medications Prior to Admission  Medication Sig Dispense Refill Last Dose  . albuterol (VENTOLIN HFA) 108 (90 Base) MCG/ACT inhaler Inhale 2 puffs into the lungs every 6 (six) hours as needed.   04/25/2021 at Unknown time  . aspirin 325 MG tablet Take 325 mg by mouth daily as needed.   04/25/2021 at Unknown time  . buprenorphine-naloxone (SUBOXONE) 8-2 mg SUBL SL  tablet Place 1 tablet under the tongue daily.   04/26/2021 at Unknown time  . fluticasone-salmeterol (ADVAIR) 250-50 MCG/ACT AEPB 1 puff in the morning and at bedtime.   04/26/2021 at Unknown time  . gabapentin (NEURONTIN) 600 MG tablet Take 600 mg by mouth 3 (three) times daily.   04/26/2021 at Unknown time  . guaiFENesin (MUCINEX) 600 MG 12 hr tablet Take 2 tablets (1,200 mg total) by mouth 2 (two) times daily. 60 tablet 0 04/26/2021 at  Unknown time  . ipratropium-albuterol (DUONEB) 0.5-2.5 (3) MG/3ML SOLN Inhale 3 mLs into the lungs every 6 (six) hours as needed.   04/26/2021 at Unknown time  . Multiple Vitamins-Minerals (CENTRUM SILVER 50+MEN PO) Take 1 tablet by mouth daily.   04/25/2021 at Unknown time  . TRELEGY ELLIPTA 100-62.5-25 MCG/INH AEPB Inhale 1 puff into the lungs daily.   04/25/2021 at Unknown time  . ibuprofen (ADVIL) 400 MG tablet Take 1 tablet (400 mg total) by mouth 3 (three) times daily. (Patient not taking: Reported on 04/26/2021) 30 tablet 0 Not Taking at Unknown time  . levofloxacin (LEVAQUIN) 500 MG tablet Take 1 tablet (500 mg total) by mouth daily. (Patient not taking: Reported on 04/26/2021) 5 tablet 0 Completed Course at Unknown time  . predniSONE (DELTASONE) 50 MG tablet Take 1 tab daily then stop (Patient not taking: Reported on 04/26/2021) 4 tablet 0 Completed Course at Unknown time   Scheduled Meds: . buprenorphine-naloxone  1 tablet Sublingual Q8H  . enoxaparin (LOVENOX) injection  40 mg Subcutaneous Q24H  . ipratropium-albuterol  3 mL Nebulization Q4H  . methylPREDNISolone (SOLU-MEDROL) injection  40 mg Intravenous Q12H  . metroNIDAZOLE  500 mg Oral Q8H   Continuous Infusions: . ceFEPime (MAXIPIME) IV 2 g (04/27/21 0653)   PRN Meds:.acetaminophen, albuterol, dextromethorphan-guaiFENesin, ondansetron (ZOFRAN) IV   Results for orders placed or performed during the hospital encounter of 04/26/21 (from the past 48 hour(s))  CBC with Differential     Status: Abnormal   Collection Time: 04/26/21 11:32 AM  Result Value Ref Range   WBC 13.7 (H) 4.0 - 10.5 K/uL   RBC 4.09 (L) 4.22 - 5.81 MIL/uL   Hemoglobin 11.4 (L) 13.0 - 17.0 g/dL   HCT 40.9 (L) 81.1 - 91.4 %   MCV 86.6 80.0 - 100.0 fL   MCH 27.9 26.0 - 34.0 pg   MCHC 32.2 30.0 - 36.0 g/dL   RDW 78.2 95.6 - 21.3 %   Platelets 325 150 - 400 K/uL   nRBC 0.0 0.0 - 0.2 %   Neutrophils Relative % 85 %   Neutro Abs 11.8 (H) 1.7 - 7.7 K/uL    Lymphocytes Relative 8 %   Lymphs Abs 1.1 0.7 - 4.0 K/uL   Monocytes Relative 6 %   Monocytes Absolute 0.8 0.1 - 1.0 K/uL   Eosinophils Relative 0 %   Eosinophils Absolute 0.0 0.0 - 0.5 K/uL   Basophils Relative 0 %   Basophils Absolute 0.0 0.0 - 0.1 K/uL   Immature Granulocytes 1 %   Abs Immature Granulocytes 0.07 0.00 - 0.07 K/uL    Comment: Performed at [facility], [address], [place], [place] [address]  Basic metabolic panel     Status: Abnormal   Collection Time: 04/26/21 11:32 AM  Result Value Ref Range   Sodium 133 (L) 135 - 145 mmol/L   Potassium 3.8 3.5 - 5.1 mmol/L    Comment: HEMOLYSIS AT THIS LEVEL MAY AFFECT RESULT   Chloride 96 (L)  98 - 111 mmol/L   CO2 29 22 - 32 mmol/L   Glucose, Bld 90 70 - 99 mg/dL    Comment: Glucose reference range applies only to samples taken after fasting for at least 8 hours.   BUN 22 8 - 23 mg/dL   Creatinine, Ser 8.84 (L) 0.61 - 1.24 mg/dL   Calcium 8.1 (L) 8.9 - 10.3 mg/dL   GFR, Estimated >16 >60 mL/min    Comment: (NOTE) Calculated using the CKD-EPI Creatinine Equation (2021)    Anion gap 8 5 - 15    Comment: Performed at [facility], [address], [place], [place] [address]  Troponin I (High Sensitivity)     Status: None   Collection Time: 04/26/21 11:32 AM  Result Value Ref Range   Troponin I (High Sensitivity) 8 <18 ng/L    Comment: (NOTE) Elevated high sensitivity troponin I (hsTnI) values and significant  changes across serial measurements may suggest ACS but many other  chronic and acute conditions are known to elevate hsTnI results.  Refer to the "Links" section for chest pain algorithms and additional  guidance. Performed at [facility], [address] [place], [place] [address]   Culture, blood (single)     Status: None (Preliminary result)   Collection Time: 04/26/21  1:54 PM   Specimen: BLOOD  Result Value Ref Range   Specimen Description BLOOD RIGHT ANTECUBITAL    Special  Requests      BOTTLES DRAWN AEROBIC AND ANAEROBIC Blood Culture results may not be optimal due to an excessive volume of blood received in culture bottles   Culture      NO GROWTH < 24 HOURS Performed at [facility], [address] [place], [place] [address]    Report Status PENDING   Lactic acid, plasma     Status: None   Collection Time: 04/26/21  4:08 PM  Result Value Ref Range   Lactic Acid, Venous 0.9 0.5 - 1.9 mmol/L    Comment: Performed at [facility], [address], [place], [place] [address]  Protime-INR     Status: Abnormal   Collection Time: 04/26/21  4:08 PM  Result Value Ref Range   Prothrombin Time 15.4 (H) 11.4 - 15.2 seconds   INR 1.2 0.8 - 1.2    Comment: (NOTE) INR goal varies based on device and disease states. Performed at [facility], [address] [place], [place] [address]   APTT     Status: None   Collection Time: 04/26/21  4:08 PM  Result Value Ref Range   aPTT 25 24 - 36 seconds    Comment: Performed at [facility], [address] [place], [place] [address]  Procalcitonin     Status: None   Collection Time: 04/26/21  4:08 PM  Result Value Ref Range   Procalcitonin <0.10 ng/mL    Comment:        Interpretation: PCT (Procalcitonin) <= 0.5 ng/mL: Systemic infection (sepsis) is not likely. Local bacterial infection is possible. (NOTE)       Sepsis PCT Algorithm           Lower Respiratory Tract                                      Infection PCT Algorithm    ----------------------------     ----------------------------  PCT < 0.25 ng/mL                PCT < 0.10 ng/mL          Strongly encourage             Strongly discourage   discontinuation of antibiotics    initiation of antibiotics    ----------------------------     -----------------------------       PCT 0.25 - 0.50 ng/mL            PCT 0.10 - 0.25 ng/mL               OR       >80% decrease in PCT            Discourage initiation of                                             antibiotics      Encourage discontinuation           of antibiotics    ----------------------------     -----------------------------         PCT >= 0.50 ng/mL              PCT 0.26 - 0.50 ng/mL               AND        <80% decrease in PCT             Encourage initiation of                                             antibiotics       Encourage continuation           of antibiotics    ----------------------------     -----------------------------        PCT >= 0.50 ng/mL                  PCT > 0.50 ng/mL               AND         increase in PCT                  Strongly encourage                                      initiation of antibiotics    Strongly encourage escalation           of antibiotics                                     -----------------------------                                           PCT <= 0.25 ng/mL  OR                                        > 80% decrease in PCT                                      Discontinue / Do not initiate                                             antibiotics  Performed at [facility], [address] [place], [place] [address]   Resp Panel by RT-PCR (Flu A&B, Covid) Nasopharyngeal Swab     Status: None   Collection Time: 04/26/21  4:08 PM   Specimen: Nasopharyngeal Swab; Nasopharyngeal(NP) swabs in vial transport medium  Result Value Ref Range   SARS Coronavirus 2 by RT PCR NEGATIVE NEGATIVE    Comment: (NOTE) SARS-CoV-2 target nucleic acids are NOT DETECTED.  The SARS-CoV-2 RNA is generally detectable in upper respiratory specimens during the acute phase of infection. The lowest concentration of SARS-CoV-2 viral copies this assay can detect is 138 copies/mL. A negative result does not preclude SARS-Cov-2 infection and should not be used as the sole basis for treatment or other patient management decisions. A negative result  may occur with  improper specimen collection/handling, submission of specimen other than nasopharyngeal swab, presence of viral mutation(s) within the areas targeted by this assay, and inadequate number of viral copies(<138 copies/mL). A negative result must be combined with clinical observations, patient history, and epidemiological information. The expected result is Negative.  Fact Sheet for Patients:  BloggerCourse.com  Fact Sheet for Healthcare Providers:  SeriousBroker.it  This test is no t yet approved or cleared by the [place] FDA and  has been authorized for detection and/or diagnosis of SARS-CoV-2 by FDA under an Emergency Use Authorization (EUA). This EUA will remain  in effect (meaning this test can be used) for the duration of the COVID-19 declaration under Section 564(b)(1) of the Act, 21 U.S.C.section 360bbb-3(b)(1), unless the authorization is terminated  or revoked sooner.       Influenza A by PCR NEGATIVE NEGATIVE   Influenza B by PCR NEGATIVE NEGATIVE    Comment: (NOTE) The Xpert Xpress SARS-CoV-2/FLU/RSV plus assay is intended as an aid in the diagnosis of influenza from Nasopharyngeal swab specimens and should not be used as a sole basis for treatment. Nasal washings and aspirates are unacceptable for Xpert Xpress SARS-CoV-2/FLU/RSV testing.  Fact Sheet for Patients: BloggerCourse.com  Fact Sheet for Healthcare Providers: SeriousBroker.it  This test is not yet approved or cleared by the [place] FDA and has been authorized for detection and/or diagnosis of SARS-CoV-2 by FDA under an Emergency Use Authorization (EUA). This EUA will remain in effect (meaning this test can be used) for the duration of the COVID-19 declaration under Section 564(b)(1) of the Act, 21 U.S.C. section 360bbb-3(b)(1), unless the authorization is terminated  or revoked.  Performed at [facility], [address] [place], [place] [address]   Strep pneumoniae urinary antigen     Status: None   Collection Time: 04/26/21  4:08 PM  Result Value Ref Range   Strep Pneumo Urinary Antigen NEGATIVE NEGATIVE  Comment:        Infection due to S. pneumoniae cannot be absolutely ruled out since the antigen present may be below the detection limit of the test. Performed at [facility], [address], [place], [place] [address]   Culture, sputum-assessment     Status: None   Collection Time: 04/26/21  4:08 PM   Specimen: Nasopharyngeal Swab; Sputum  Result Value Ref Range   Specimen Description EXPECTORATED SPUTUM    Special Requests NONE    Sputum evaluation      THIS SPECIMEN IS ACCEPTABLE FOR SPUTUM CULTURE Performed at [facility], [address] [place], [place] [address]    Report Status 04/27/2021 FINAL   Lactic acid, plasma     Status: None   Collection Time: 04/26/21  5:58 PM  Result Value Ref Range   Lactic Acid, Venous 1.2 0.5 - 1.9 mmol/L    Comment: Performed at [facility], [address] [place], [place] [address]  Basic metabolic panel     Status: Abnormal   Collection Time: 04/27/21  4:36 AM  Result Value Ref Range   Sodium 134 (L) 135 - 145 mmol/L   Potassium 3.7 3.5 - 5.1 mmol/L   Chloride 103 98 - 111 mmol/L   CO2 26 22 - 32 mmol/L   Glucose, Bld 117 (H) 70 - 99 mg/dL    Comment: Glucose reference range applies only to samples taken after fasting for at least 8 hours.   BUN 19 8 - 23 mg/dL   Creatinine, Ser 9.14 (L) 0.61 - 1.24 mg/dL   Calcium 7.6 (L) 8.9 - 10.3 mg/dL   GFR, Estimated >78 >29 mL/min    Comment: (NOTE) Calculated using the CKD-EPI Creatinine Equation (2021)    Anion gap 5 5 - 15    Comment: Performed at [facility], [address] [place], [place] [address]  CBC     Status: Abnormal   Collection Time: 04/27/21  4:36 AM  Result Value Ref  Range   WBC 11.9 (H) 4.0 - 10.5 K/uL   RBC 3.43 (L) 4.22 - 5.81 MIL/uL   Hemoglobin 9.6 (L) 13.0 - 17.0 g/dL   HCT 08.6 (L) 57.8 - 46.9 %   MCV 85.7 80.0 - 100.0 fL   MCH 28.0 26.0 - 34.0 pg   MCHC 32.7 30.0 - 36.0 g/dL   RDW 62.9 (H) 52.8 - 41.3 %   Platelets 278 150 - 400 K/uL   nRBC 0.0 0.0 - 0.2 %    Comment: Performed at [facility], [address] [place], [place] [address]    DG Chest 2 View  Result Date: 04/26/2021 CLINICAL DATA:  Shortness of breath.  History of pneumonia. EXAM: CHEST - 2 VIEW COMPARISON:  Chest x-ray 04/17/2021 FINDINGS: The cardiac silhouette, mediastinal and hilar contours are within normal limits is stable. There is mild tortuosity and calcification of the thoracic aorta. Persistent extensive left lower lobe airspace process. Underlying emphysematous changes are noted but could not exclude cavitary process in the left lower lobe. Findings could suggest cavitary pneumonia or lung abscesses. Tumor would be another possibility. Recommend chest CT for further evaluation. The right lung remains relatively clear. IMPRESSION: Persistent extensive left lower lobe airspace process. Findings could suggest cavitary pneumonia or lung abscesses. Neoplastic process would be another possibility. Recommend chest CT with contrast for further evaluation. Electronically Signed   By: Rudie Meyer M.D.   On: 04/26/2021 12:26   CT  Chest W Contrast  Result Date: 04/26/2021 CLINICAL DATA:  Pneumonia in a 70 year old male. EXAM: CT CHEST WITH CONTRAST TECHNIQUE: Multidetector CT imaging of the chest was performed during intravenous contrast administration. CONTRAST:  110mL OMNIPAQUE IOHEXOL 300 MG/ML  SOLN COMPARISON:  Chest x-ray of Apr 26, 2021. FINDINGS: Cardiovascular: Heart size normal. No substantial pericardial effusion. Aortic valvular calcifications. Aortic atherosclerosis. Normal caliber central pulmonary vessels. Mediastinum/Nodes: Esophagus mildly patulous. RIGHT  paratracheal lymph node measuring 1 cm. Scattered nodes elsewhere throughout the chest, largest is a subcarinal lymph node measuring 1.5 cm short axis (image 80/2) no axillary lymphadenopathy. No supraclavicular adenopathy. Precarinal lymph node on image 65/2 measuring 1 cm. Mild enlargement of RIGHT hilar nodal tissue at 11 mm. Fullness of LEFT hilar nodal tissue as well, largest measuring approximately 11 mm (image 82/2) Lungs/Pleura: Severe pulmonary emphysema. Nodular opacities at the RIGHT lung base. Material within lower lobe bronchi bilaterally. Some ground-glass and septal thickening in the lingula. Dense basilar consolidation in the LEFT chest with multiple air-fluid levels involving the entire LEFT lower lobe aside from a portion of the superior segment. Trace LEFT-sided effusion. Upper Abdomen: Incidental imaging of upper abdominal contents without acute process. Musculoskeletal: No acute musculoskeletal finding. No destructive bone process. IMPRESSION: 1. Necrotic pneumonia likely due to aspiration in the LEFT lower lobe with multiple fluid filled cavities superimposed on a background of pulmonary emphysema. 2. Associated LEFT-sided pleural effusion is very small volume and without current pleural enhancement. The patient would be at risk for developing empyema, therefore would suggest attention on follow-up. 3. Pneumonitis at the RIGHT lung base as well without frank consolidative changes. 4. Nodal enlargement in the chest likely reactive, consider three-month follow-up to ensure resolution of above findings and assess lymph nodes. 5. Severe pulmonary emphysema. 6. Emphysema and aortic atherosclerosis. Aortic Atherosclerosis (ICD10-I70.0) and Emphysema (ICD10-J43.9). Electronically Signed   By: Donzetta Kohut M.D.   On: 04/26/2021 14:28    Review of Systems  A 10 point review of systems was performed and it is as noted above otherwise negative.  Blood pressure (!) 90/55, pulse 74, temperature 97.8  F (36.6 C), resp. rate 17, height 5\' 10"  (1.778 m), weight 63.6 kg, SpO2 97 %. Physical Exam GENERAL: Chronically ill-appearing male, no acute distress, use of accessories of respiration appears chronic. HEAD: Normocephalic, atraumatic.  EYES: Pupils equal, round, reactive to light.  No scleral icterus.  MOUTH: Oral mucosa moist.  No thrush. NECK: Supple. No thyromegaly. Trachea midline. No JVD.  No adenopathy. PULMONARY: Distant breath sounds, poor air entry.  Diminished breath sounds at the bases.  No wheezes noted.  Few rhonchi.  Positive Hoover sign. CARDIOVASCULAR: S1 and S2. Regular rate and rhythm.  No rubs, murmurs or gallops heard. ABDOMEN: Scaphoid, benign. MUSCULOSKELETAL: No joint deformity, no clubbing, no edema.  NEUROLOGIC: No focal deficit, no gait disturbance, speech is fluent. SKIN: Intact,warm,dry.  On limited exam no rashes PSYCH: Mood and behavior normal.  Chest x-ray performed 26 Apr 2021 independently reviewed:   Representative images from the CT performed 26 Apr 2021, independently reviewed:   Severe bullous emphysema noted      Necrotizing pneumonia left lower lobe with air-fluid levels in emphysematous bullae (red arrow)    Assessment/Plan:  Severe necrotizing pneumonia left lower lobe Pseudomonas aeruginosa likely Radiographic picture is consistent with Pseudomonas necrotizing pneumonia Patient also has "bullitis" due to infection on emphysematous bullae Continue antibiotic therapy, agree with cefepime Agree with steroid therapy to reduce inflammation Will need PICC line  placement for IV antibiotic management Minimum antibiotic treatment will be 21 days depending on radiographic response No indication for bronchoscopy at this point as it will not add to diagnosis/management Encourage use of flutter valve for secretion clearance May consider postural drainage  Very severe GOLD class IV COPD Bronchiectasis Continue nebulization  treatments Continue IV steroids Flutter valve/pulmonary hygiene  Acute on chronic respiratory failure Secondary pulmonary hypertension Continue oxygen supplementation Patient reluctant to continue oxygen at home Suspect oxygen requirement will be permanent  Thank you for allowing us to participate in this patient's care.  Patient indicates that he would want to [facility].   Gailen Shelter. Laura Rovena Hearld, MD [facility] 04/27/2021, 1:06 PM    *This note was dictated using voice recognition software/Dragon.  Despite best efforts to proofread, errors can occur which can change the meaning.  Any change was purely unintentional.

## 2021-04-27 NOTE — Progress Notes (Signed)
PROGRESS NOTE    Frank Calderon  DJM:[id number] DOB: Jan 14, 1951 DOA: 04/26/2021 PCP: [facility]   Brief Narrative: Taken from H&P. Frank Calderon is a 70 y.o. male with medical history significant of COPD on 2L O2, chronic pain on Suboxone, who presents with SOB.  Patient was recently hospitalized from 5/11 to 5/15 due to CAP and COPD exacerbation.  Patient was discharged on Levaquin and 2 L oxygen.  Sputum culture on 04/19/2021 was positive for Pseudomonas. Patient states that he has been having progressively worsening shortness in the past 5 days.  He also has pleuritic chest pain, which is located in the left side of chest, constant, sharp, nonradiating, aggravated by deep breath and coughing.  He has productive cough with yellow-colored mucus production.  Denies fever or chills.   Hemodynamically stable.  CT chest with concern of necrotizing pneumonia and severe emphysema with bullous disease.  After discussing with Dr. Luciana Axe from ID he was started on cefepime and Flagyl for pseudomonal and anaerobic coverage. Pulmonary was also consulted today-patient will need long-term antibiotics with his current advanced underlying lung disease with necrotizing pneumonia. Pulmonary is also suggesting involving palliative care to discuss goals of care due to advanced underlying lung disease.  Patient is very high risk for deterioration and death.  Subjective: Patient was seen and examined today.  Continues to have cough and shortness of breath.  He was getting his breathing treatment during morning rounds.  No fever or chills.  Denies any pain.  Assessment & Plan:   Principal Problem:   Acute on chronic respiratory failure with hypoxia (HCC) Active Problems:   COPD with acute exacerbation (HCC)   HCAP (healthcare-associated pneumonia)   Aspiration pneumonia (HCC)   Chronic pain  Acute on chronic hypoxic respiratory failure secondary to necrotizing pneumonia.  Patient with  advanced COPD and bullous emphysematous disease.  CT chest concerning for necrotizing pneumonia.  Recent sputum culture positive for Pseudomonas. ID was consulted by admitting provider and suggesting cefepime with Flagyl for pseudomonal and anaerobic coverage as there was also some concern of aspiration. Patient did not meet criteria for sepsis.  Blood culture and procalcitonin negative. Pulmonary was consulted -appreciate their recommendations. -Continue with cefepime and Flagyl -Continue with Solu-Medrol -Follow-up repeat sputum cultures -Follow-up supportive care. -Continue his supplemental oxygen to keep the saturation above 88%  Chronic pain. -Continue home Suboxone  Objective: Vitals:   04/27/21 0723 04/27/21 0752 04/27/21 1109 04/27/21 1150  BP: (!) 109/59   (!) 90/55  Pulse: 61   74  Resp: 18   17  Temp: 97.7 F (36.5 C)   97.8 F (36.6 C)  TempSrc:      SpO2: 96% 95% 96% 97%  Weight: 63.6 kg     Height:        Intake/Output Summary (Last 24 hours) at 04/27/2021 1405 Last data filed at 04/27/2021 1401 Gross per 24 hour  Intake 3917.6 ml  Output 2150 ml  Net 1767.6 ml   Filed Weights   04/26/21 1129 04/27/21 0723  Weight: 63.5 kg 63.6 kg    Examination:  General exam: Appears calm and comfortable  Respiratory system: Few bilateral rhonchi. Respiratory effort normal. Cardiovascular system: S1 & S2 heard, RRR.  Gastrointestinal system: Soft, nontender, nondistended, bowel sounds positive. Central nervous system: Alert and oriented. No focal neurological deficits.Symmetric 5 x 5 power. Extremities: No edema, no cyanosis, pulses intact and symmetrical. Psychiatry: Judgement and insight appear normal. Mood & affect appropriate.    DVT  prophylaxis: Lovenox Code Status: Full Family Communication: Discussed with patient Disposition Plan:  Status is: Inpatient  Remains inpatient appropriate because:Inpatient level of care appropriate due to severity of  illness   Dispo: The patient is from: Home              Anticipated d/c is to: Home              Patient currently is not medically stable to d/c.   Difficult to place patient No              Level of care: Progressive Cardiac  All the records are reviewed and case discussed with Care Management/Social Worker. Management plans discussed with the patient, nursing and they are in agreement.  Consultants:   ID  Pulmonology  Procedures:  Antimicrobials:  Cefepime Flagyl  Data Reviewed: I have personally reviewed following labs and imaging studies  CBC: Recent Labs  Lab 04/21/21 0858 04/26/21 1132 04/27/21 0436  WBC 18.4* 13.7* 11.9*  NEUTROABS 16.2* 11.8*  --   HGB 10.5* 11.4* 9.6*  HCT 32.5* 35.4* 29.4*  MCV 87.4 86.6 85.7  PLT 305 325 278   Basic Metabolic Panel: Recent Labs  Lab 04/26/21 1132 04/27/21 0436  NA 133* 134*  K 3.8 3.7  CL 96* 103  CO2 29 26  GLUCOSE 90 117*  BUN 22 19  CREATININE 0.60* 0.57*  CALCIUM 8.1* 7.6*   GFR: Estimated Creatinine Clearance: 79.5 mL/min (A) (by C-G formula based on SCr of 0.57 mg/dL (L)). Liver Function Tests: No results for input(s): AST, ALT, ALKPHOS, BILITOT, PROT, ALBUMIN in the last 168 hours. No results for input(s): LIPASE, AMYLASE in the last 168 hours. No results for input(s): AMMONIA in the last 168 hours. Coagulation Profile: Recent Labs  Lab 04/26/21 1608  INR 1.2   Cardiac Enzymes: No results for input(s): CKTOTAL, CKMB, CKMBINDEX, TROPONINI in the last 168 hours. BNP (last 3 results) No results for input(s): PROBNP in the last 8760 hours. HbA1C: No results for input(s): HGBA1C in the last 72 hours. CBG: No results for input(s): GLUCAP in the last 168 hours. Lipid Profile: No results for input(s): CHOL, HDL, LDLCALC, TRIG, CHOLHDL, LDLDIRECT in the last 72 hours. Thyroid Function Tests: No results for input(s): TSH, T4TOTAL, FREET4, T3FREE, THYROIDAB in the last 72 hours. Anemia Panel: No  results for input(s): VITAMINB12, FOLATE, FERRITIN, TIBC, IRON, RETICCTPCT in the last 72 hours. Sepsis Labs: Recent Labs  Lab 04/26/21 1608 04/26/21 1758  PROCALCITON <0.10  --   LATICACIDVEN 0.9 1.2    Recent Results (from the past 240 hour(s))  Expectorated Sputum Assessment w Gram Stain, Rflx to Resp Cult     Status: None   Collection Time: 04/19/21 11:36 AM   Specimen: Sputum  Result Value Ref Range Status   Specimen Description SPUTUM  Final   Special Requests SPUTUM  Final   Sputum evaluation   Final    THIS SPECIMEN IS ACCEPTABLE FOR SPUTUM CULTURE Performed at [facility], [address] [place], [place] [address]    Report Status 04/19/2021 FINAL  Final  Culture, Respiratory w Gram Stain     Status: None   Collection Time: 04/19/21 11:36 AM   Specimen: SPU  Result Value Ref Range Status   Specimen Description   Final    SPUTUM Performed at [facility], [address] [place], [place] [address]    Special Requests   Final    SPUTUM Reflexed from [id number]  Performed at [facility], [address] [place], [place] [address]    Gram Stain   Final    ABUNDANT WBC PRESENT, PREDOMINANTLY PMN NO ORGANISMS SEEN Performed at [facility], [address], [place], [place] [address]    Culture FEW PSEUDOMONAS AERUGINOSA  Final   Report Status 04/22/2021 FINAL  Final   Organism ID, Bacteria PSEUDOMONAS AERUGINOSA  Final      Susceptibility   Pseudomonas aeruginosa - MIC*    CEFTAZIDIME 4 SENSITIVE Sensitive     CIPROFLOXACIN <=0.25 SENSITIVE Sensitive     GENTAMICIN <=1 SENSITIVE Sensitive     IMIPENEM 1 SENSITIVE Sensitive     PIP/TAZO 8 SENSITIVE Sensitive     CEFEPIME 2 SENSITIVE Sensitive     * FEW PSEUDOMONAS AERUGINOSA  Culture, blood (single)     Status: None (Preliminary result)   Collection Time: 04/26/21  1:54 PM   Specimen: BLOOD  Result Value Ref Range Status   Specimen Description BLOOD RIGHT ANTECUBITAL  Final    Special Requests   Final    BOTTLES DRAWN AEROBIC AND ANAEROBIC Blood Culture results may not be optimal due to an excessive volume of blood received in culture bottles   Culture   Final    NO GROWTH < 24 HOURS Performed at [facility], [address] [place], [place] [address]    Report Status PENDING  Incomplete  Resp Panel by RT-PCR (Flu A&B, Covid) Nasopharyngeal Swab     Status: None   Collection Time: 04/26/21  4:08 PM   Specimen: Nasopharyngeal Swab; Nasopharyngeal(NP) swabs in vial transport medium  Result Value Ref Range Status   SARS Coronavirus 2 by RT PCR NEGATIVE NEGATIVE Final    Comment: (NOTE) SARS-CoV-2 target nucleic acids are NOT DETECTED.  The SARS-CoV-2 RNA is generally detectable in upper respiratory specimens during the acute phase of infection. The lowest concentration of SARS-CoV-2 viral copies this assay can detect is 138 copies/mL. A negative result does not preclude SARS-Cov-2 infection and should not be used as the sole basis for treatment or other patient management decisions. A negative result may occur with  improper specimen collection/handling, submission of specimen other than nasopharyngeal swab, presence of viral mutation(s) within the areas targeted by this assay, and inadequate number of viral copies(<138 copies/mL). A negative result must be combined with clinical observations, patient history, and epidemiological information. The expected result is Negative.  Fact Sheet for Patients:  BloggerCourse.com  Fact Sheet for Healthcare Providers:  SeriousBroker.it  This test is no t yet approved or cleared by the [place] FDA and  has been authorized for detection and/or diagnosis of SARS-CoV-2 by FDA under an Emergency Use Authorization (EUA). This EUA will remain  in effect (meaning this test can be used) for the duration of the COVID-19 declaration under Section 564(b)(1) of  the Act, 21 U.S.C.section 360bbb-3(b)(1), unless the authorization is terminated  or revoked sooner.       Influenza A by PCR NEGATIVE NEGATIVE Final   Influenza B by PCR NEGATIVE NEGATIVE Final    Comment: (NOTE) The Xpert Xpress SARS-CoV-2/FLU/RSV plus assay is intended as an aid in the diagnosis of influenza from Nasopharyngeal swab specimens and should not be used as a sole basis for treatment. Nasal washings and aspirates are unacceptable for Xpert Xpress SARS-CoV-2/FLU/RSV testing.  Fact Sheet for Patients: BloggerCourse.com  Fact Sheet for Healthcare Providers: SeriousBroker.it  This test is not yet approved or cleared by the [place]  FDA and has been authorized for detection and/or diagnosis of SARS-CoV-2 by FDA under an Emergency Use Authorization (EUA). This EUA will remain in effect (meaning this test can be used) for the duration of the COVID-19 declaration under Section 564(b)(1) of the Act, 21 U.S.C. section 360bbb-3(b)(1), unless the authorization is terminated or revoked.  Performed at [facility], [address] [place], [place] [address]   Culture, sputum-assessment     Status: None   Collection Time: 04/26/21  4:08 PM   Specimen: Nasopharyngeal Swab; Sputum  Result Value Ref Range Status   Specimen Description EXPECTORATED SPUTUM  Final   Special Requests NONE  Final   Sputum evaluation   Final    THIS SPECIMEN IS ACCEPTABLE FOR SPUTUM CULTURE Performed at [facility], [address], [place], [place] [address]    Report Status 04/27/2021 FINAL  Final     Radiology Studies: DG Chest 2 View  Result Date: 04/26/2021 CLINICAL DATA:  Shortness of breath.  History of pneumonia. EXAM: CHEST - 2 VIEW COMPARISON:  Chest x-ray 04/17/2021 FINDINGS: The cardiac silhouette, mediastinal and hilar contours are within normal limits is stable. There is mild tortuosity and calcification of  the thoracic aorta. Persistent extensive left lower lobe airspace process. Underlying emphysematous changes are noted but could not exclude cavitary process in the left lower lobe. Findings could suggest cavitary pneumonia or lung abscesses. Tumor would be another possibility. Recommend chest CT for further evaluation. The right lung remains relatively clear. IMPRESSION: Persistent extensive left lower lobe airspace process. Findings could suggest cavitary pneumonia or lung abscesses. Neoplastic process would be another possibility. Recommend chest CT with contrast for further evaluation. Electronically Signed   By: Rudie Meyer M.D.   On: 04/26/2021 12:26   CT Chest W Contrast  Result Date: 04/26/2021 CLINICAL DATA:  Pneumonia in a 70 year old male. EXAM: CT CHEST WITH CONTRAST TECHNIQUE: Multidetector CT imaging of the chest was performed during intravenous contrast administration. CONTRAST:  70mL OMNIPAQUE IOHEXOL 300 MG/ML  SOLN COMPARISON:  Chest x-ray of Apr 26, 2021. FINDINGS: Cardiovascular: Heart size normal. No substantial pericardial effusion. Aortic valvular calcifications. Aortic atherosclerosis. Normal caliber central pulmonary vessels. Mediastinum/Nodes: Esophagus mildly patulous. RIGHT paratracheal lymph node measuring 1 cm. Scattered nodes elsewhere throughout the chest, largest is a subcarinal lymph node measuring 1.5 cm short axis (image 80/2) no axillary lymphadenopathy. No supraclavicular adenopathy. Precarinal lymph node on image 65/2 measuring 1 cm. Mild enlargement of RIGHT hilar nodal tissue at 11 mm. Fullness of LEFT hilar nodal tissue as well, largest measuring approximately 11 mm (image 82/2) Lungs/Pleura: Severe pulmonary emphysema. Nodular opacities at the RIGHT lung base. Material within lower lobe bronchi bilaterally. Some ground-glass and septal thickening in the lingula. Dense basilar consolidation in the LEFT chest with multiple air-fluid levels involving the entire LEFT lower  lobe aside from a portion of the superior segment. Trace LEFT-sided effusion. Upper Abdomen: Incidental imaging of upper abdominal contents without acute process. Musculoskeletal: No acute musculoskeletal finding. No destructive bone process. IMPRESSION: 1. Necrotic pneumonia likely due to aspiration in the LEFT lower lobe with multiple fluid filled cavities superimposed on a background of pulmonary emphysema. 2. Associated LEFT-sided pleural effusion is very small volume and without current pleural enhancement. The patient would be at risk for developing empyema, therefore would suggest attention on follow-up. 3. Pneumonitis at the RIGHT lung base as well without frank consolidative changes. 4. Nodal enlargement in the chest likely reactive, consider three-month follow-up to ensure resolution of  above findings and assess lymph nodes. 5. Severe pulmonary emphysema. 6. Emphysema and aortic atherosclerosis. Aortic Atherosclerosis (ICD10-I70.0) and Emphysema (ICD10-J43.9). Electronically Signed   By: Donzetta Kohut M.D.   On: 04/26/2021 14:28    Scheduled Meds: . buprenorphine-naloxone  1 tablet Sublingual Q8H  . enoxaparin (LOVENOX) injection  40 mg Subcutaneous Q24H  . ipratropium-albuterol  3 mL Nebulization Q4H  . methylPREDNISolone (SOLU-MEDROL) injection  40 mg Intravenous Q12H  . metroNIDAZOLE  500 mg Oral Q8H   Continuous Infusions: . ceFEPime (MAXIPIME) IV 2 g (04/27/21 1357)     LOS: 1 day   Time spent: 45 minutes. More than 50% of the time was spent in counseling/coordination of care  Arnetha Courser, MD [facility]  If 7PM-7AM, please contact night-coverage [email or web address]  04/27/2021, 2:05 PM   This record has been created using Conservation officer, historic buildings. Errors have been sought and corrected,but may not always be located. Such creation errors do not reflect on the standard of care.

## 2021-04-28 ENCOUNTER — Inpatient Hospital Stay: Payer: Self-pay

## 2021-04-28 DIAGNOSIS — J441 Chronic obstructive pulmonary disease with (acute) exacerbation: Secondary | ICD-10-CM | POA: Diagnosis not present

## 2021-04-28 DIAGNOSIS — J850 Gangrene and necrosis of lung: Secondary | ICD-10-CM | POA: Diagnosis not present

## 2021-04-28 DIAGNOSIS — J9621 Acute and chronic respiratory failure with hypoxia: Secondary | ICD-10-CM | POA: Diagnosis not present

## 2021-04-28 LAB — CULTURE, RESPIRATORY W GRAM STAIN: Culture: NORMAL

## 2021-04-28 LAB — MRSA PCR SCREENING: MRSA by PCR: NEGATIVE

## 2021-04-28 LAB — TROPONIN I (HIGH SENSITIVITY)
Troponin I (High Sensitivity): 11 ng/L (ref <18)
Troponin I (High Sensitivity): 6 ng/L (ref <18)

## 2021-04-28 MED ORDER — IPRATROPIUM-ALBUTEROL 0.5-2.5 (3) MG/3ML IN SOLN
3.0000 mL | Freq: Four times a day (QID) | RESPIRATORY_TRACT | Status: DC
  Administered 2021-04-28 (×2): 3 mL via RESPIRATORY_TRACT
  Filled 2021-04-28 (×2): qty 3

## 2021-04-28 MED ORDER — SODIUM CHLORIDE 0.9% FLUSH: 10.0000 mL | INTRAVENOUS | Status: DC | PRN

## 2021-04-28 MED ORDER — CHLORHEXIDINE GLUCONATE CLOTH 2 % EX PADS
6.0000 | MEDICATED_PAD | Freq: Every day | CUTANEOUS | Status: DC
  Administered 2021-04-29 – 2021-04-30 (×2): 6 via TOPICAL

## 2021-04-28 MED ORDER — IPRATROPIUM-ALBUTEROL 0.5-2.5 (3) MG/3ML IN SOLN
3.0000 mL | Freq: Three times a day (TID) | RESPIRATORY_TRACT | Status: DC
  Administered 2021-04-28 – 2021-04-29 (×4): 3 mL via RESPIRATORY_TRACT
  Filled 2021-04-28 (×4): qty 3

## 2021-04-28 MED ORDER — SODIUM CHLORIDE 0.9% FLUSH
10.0000 mL | Freq: Two times a day (BID) | INTRAVENOUS | Status: DC
  Administered 2021-04-28 – 2021-04-30 (×4): 10 mL

## 2021-04-28 NOTE — Progress Notes (Signed)
Peripherally Inserted Central Catheter Placement  The IV Nurse has discussed with the patient and/or persons authorized to consent for the patient, the purpose of this procedure and the potential benefits and risks involved with this procedure.  The benefits include less needle sticks, lab draws from the catheter, and the patient may be discharged home with the catheter. Risks include, but not limited to, infection, bleeding, blood clot (thrombus formation), and puncture of an artery; nerve damage and irregular heartbeat and possibility to perform a PICC exchange if needed/ordered by physician.  Alternatives to this procedure were also discussed.  Bard Power PICC patient education guide, fact sheet on infection prevention and patient information card has been provided to patient /or left at bedside.    PICC Placement Documentation  PICC Single Lumen 04/28/21 Right Brachial 38 cm 0 cm (Active)  Indication for Insertion or Continuance of Line Limited venous access - need for IV therapy >5 days (PICC only) 04/28/21 1652  Exposed Catheter (cm) 0 cm 04/28/21 1652  Site Assessment Clean;Dry;Intact 04/28/21 1652  Line Status Flushed;Saline locked;Blood return noted 04/28/21 1652  Dressing Type Transparent 04/28/21 1652  Dressing Status Clean;Dry;Intact 04/28/21 1652  Antimicrobial disc in place? Yes 04/28/21 1652  Safety Lock Not Applicable 04/28/21 1652  Line Care Connections checked and tightened 04/28/21 1652  Line Adjustment (NICU/IV Team Only) No 04/28/21 1652  Dressing Intervention New dressing 04/28/21 1652  Dressing Change Due 05/05/21 04/28/21 1652       Elliot Dally 04/28/2021, 4:53 PM

## 2021-04-28 NOTE — Progress Notes (Signed)
PROGRESS NOTE    Frank Calderon  WVP:[id number] DOB: 1951-06-28 DOA: 04/26/2021 PCP: [facility]   Brief Narrative: Taken from H&P. Frank Calderon is a 70 y.o. male with medical history significant of COPD on 2L O2, chronic pain on Suboxone, who presents with SOB.  Patient was recently hospitalized from 5/11 to 5/15 due to CAP and COPD exacerbation.  Patient was discharged on Levaquin and 2 L oxygen.  Sputum culture on 04/19/2021 was positive for Pseudomonas. Patient states that he has been having progressively worsening shortness in the past 5 days.  He also has pleuritic chest pain, which is located in the left side of chest, constant, sharp, nonradiating, aggravated by deep breath and coughing.  He has productive cough with yellow-colored mucus production.  Denies fever or chills.   Hemodynamically stable.  CT chest with concern of necrotizing pneumonia and severe emphysema with bullous disease.  After discussing with Dr. Luciana Axe from ID he was started on cefepime and Flagyl for pseudomonal and anaerobic coverage. Pulmonary was also consulted today-patient will need long-term antibiotics with his current advanced underlying lung disease with necrotizing pneumonia. Pulmonary is also suggesting involving palliative care to discuss goals of care due to advanced underlying lung disease.  Patient is very high risk for deterioration and death.  Subjective: Patient was complaining of left-sided chest pain with some pleuritic component.  Denies any radiation, no associated palpitations or shortness of breath, no nausea or vomiting.  Denies any association with posture.  Assessment & Plan:   Principal Problem:   Acute on chronic respiratory failure with hypoxia (HCC) Active Problems:   COPD with acute exacerbation (HCC)   HCAP (healthcare-associated pneumonia)   Aspiration pneumonia (HCC)   Chronic pain  Acute on chronic hypoxic respiratory failure secondary to necrotizing  pneumonia.  Patient with advanced COPD and bullous emphysematous disease.  CT chest concerning for necrotizing pneumonia.  Recent sputum culture positive for Pseudomonas. ID was consulted by admitting provider and suggesting cefepime with Flagyl for pseudomonal and anaerobic coverage as there was also some concern of aspiration. Patient did not meet criteria for sepsis.  Blood culture and procalcitonin negative. Pulmonary was consulted -appreciate their recommendations. -Continue with cefepime and Flagyl-we will ask ID tomorrow about the duration of antibiotics, might have to go home with PICC line for prolonged cefepime course as advised by pulmonology. -Continue with Solu-Medrol -Follow-up repeat sputum cultures -Follow-up supportive care. -Continue his supplemental oxygen to keep the saturation above 88%  Chest pain.  Most likely secondary to necrotizing pneumonia. -We will check troponin  Chronic pain. -Continue home Suboxone  Objective: Vitals:   04/28/21 0548 04/28/21 0738 04/28/21 0830 04/28/21 1201  BP: 130/68   110/63  Pulse: 60   72  Resp: 18   16  Temp: 97.7 F (36.5 C)   97.7 F (36.5 C)  TempSrc: Oral   Oral  SpO2: 99% 96%  98%  Weight:   63.7 kg   Height:        Intake/Output Summary (Last 24 hours) at 04/28/2021 1440 Last data filed at 04/28/2021 1354 Gross per 24 hour  Intake 1411.73 ml  Output 1800 ml  Net -388.27 ml   Filed Weights   04/26/21 1129 04/27/21 0723 04/28/21 0830  Weight: 63.5 kg 63.6 kg 63.7 kg    Examination:  General.  Well-developed, anxious looking gentleman, in no acute distress. Pulmonary.  Few scattered rhonchi predominantly at left base, normal respiratory effort. CV.  Regular rate and rhythm, no  JVD, rub or murmur. Abdomen.  Soft, nontender, nondistended, BS positive. CNS.  Alert and oriented x3.  No focal neurologic deficit. Extremities.  No edema, no cyanosis, pulses intact and symmetrical. Psychiatry.  Judgment and insight  appears normal.   DVT prophylaxis: Lovenox Code Status: Full Family Communication: Discussed with patient Disposition Plan:  Status is: Inpatient  Remains inpatient appropriate because:Inpatient level of care appropriate due to severity of illness   Dispo: The patient is from: Home              Anticipated d/c is to: Home              Patient currently is not medically stable to d/c.   Difficult to place patient No              Level of care: Progressive Cardiac  All the records are reviewed and case discussed with Care Management/Social Worker. Management plans discussed with the patient, nursing and they are in agreement.  Consultants:   ID  Pulmonology  Procedures:  Antimicrobials:  Cefepime Flagyl  Data Reviewed: I have personally reviewed following labs and imaging studies  CBC: Recent Labs  Lab 04/26/21 1132 04/27/21 0436  WBC 13.7* 11.9*  NEUTROABS 11.8*  --   HGB 11.4* 9.6*  HCT 35.4* 29.4*  MCV 86.6 85.7  PLT 325 278   Basic Metabolic Panel: Recent Labs  Lab 04/26/21 1132 04/27/21 0436  NA 133* 134*  K 3.8 3.7  CL 96* 103  CO2 29 26  GLUCOSE 90 117*  BUN 22 19  CREATININE 0.60* 0.57*  CALCIUM 8.1* 7.6*   GFR: Estimated Creatinine Clearance: 79.6 mL/min (A) (by C-G formula based on SCr of 0.57 mg/dL (L)). Liver Function Tests: No results for input(s): AST, ALT, ALKPHOS, BILITOT, PROT, ALBUMIN in the last 168 hours. No results for input(s): LIPASE, AMYLASE in the last 168 hours. No results for input(s): AMMONIA in the last 168 hours. Coagulation Profile: Recent Labs  Lab 04/26/21 1608  INR 1.2   Cardiac Enzymes: No results for input(s): CKTOTAL, CKMB, CKMBINDEX, TROPONINI in the last 168 hours. BNP (last 3 results) No results for input(s): PROBNP in the last 8760 hours. HbA1C: No results for input(s): HGBA1C in the last 72 hours. CBG: No results for input(s): GLUCAP in the last 168 hours. Lipid Profile: No results for input(s):  CHOL, HDL, LDLCALC, TRIG, CHOLHDL, LDLDIRECT in the last 72 hours. Thyroid Function Tests: No results for input(s): TSH, T4TOTAL, FREET4, T3FREE, THYROIDAB in the last 72 hours. Anemia Panel: No results for input(s): VITAMINB12, FOLATE, FERRITIN, TIBC, IRON, RETICCTPCT in the last 72 hours. Sepsis Labs: Recent Labs  Lab 04/26/21 1608 04/26/21 1758  PROCALCITON <0.10  --   LATICACIDVEN 0.9 1.2    Recent Results (from the past 240 hour(s))  Expectorated Sputum Assessment w Gram Stain, Rflx to Resp Cult     Status: None   Collection Time: 04/19/21 11:36 AM   Specimen: Sputum  Result Value Ref Range Status   Specimen Description SPUTUM  Final   Special Requests SPUTUM  Final   Sputum evaluation   Final    THIS SPECIMEN IS ACCEPTABLE FOR SPUTUM CULTURE Performed at [facility], [address], [place], [place] [address]    Report Status 04/19/2021 FINAL  Final  Culture, Respiratory w Gram Stain     Status: None   Collection Time: 04/19/21 11:36 AM   Specimen: SPU  Result Value Ref Range Status   Specimen Description  Final    SPUTUM Performed at [facility], [address] [place], [place] [address]    Special Requests   Final    SPUTUM Reflexed from [id number] [id number] Performed at [facility], [address] [place], [place] [address]    Gram Stain   Final    ABUNDANT WBC PRESENT, PREDOMINANTLY PMN NO ORGANISMS SEEN Performed at [facility], [address] [place], [place] [address]    Culture FEW PSEUDOMONAS AERUGINOSA  Final   Report Status 04/22/2021 FINAL  Final   Organism ID, Bacteria PSEUDOMONAS AERUGINOSA  Final      Susceptibility   Pseudomonas aeruginosa - MIC*    CEFTAZIDIME 4 SENSITIVE Sensitive     CIPROFLOXACIN <=0.25 SENSITIVE Sensitive     GENTAMICIN <=1 SENSITIVE Sensitive     IMIPENEM 1 SENSITIVE Sensitive     PIP/TAZO 8 SENSITIVE Sensitive     CEFEPIME 2 SENSITIVE Sensitive     * FEW PSEUDOMONAS AERUGINOSA  Culture,  blood (single)     Status: None (Preliminary result)   Collection Time: 04/26/21  1:54 PM   Specimen: BLOOD  Result Value Ref Range Status   Specimen Description BLOOD RIGHT ANTECUBITAL  Final   Special Requests   Final    BOTTLES DRAWN AEROBIC AND ANAEROBIC Blood Culture results may not be optimal due to an excessive volume of blood received in culture bottles   Culture   Final    NO GROWTH 2 DAYS Performed at [facility], [address] [place], [place] [address]    Report Status PENDING  Incomplete  Resp Panel by RT-PCR (Flu A&B, Covid) Nasopharyngeal Swab     Status: None   Collection Time: 04/26/21  4:08 PM   Specimen: Nasopharyngeal Swab; Nasopharyngeal(NP) swabs in vial transport medium  Result Value Ref Range Status   SARS Coronavirus 2 by RT PCR NEGATIVE NEGATIVE Final    Comment: (NOTE) SARS-CoV-2 target nucleic acids are NOT DETECTED.  The SARS-CoV-2 RNA is generally detectable in upper respiratory specimens during the acute phase of infection. The lowest concentration of SARS-CoV-2 viral copies this assay can detect is 138 copies/mL. A negative result does not preclude SARS-Cov-2 infection and should not be used as the sole basis for treatment or other patient management decisions. A negative result may occur with  improper specimen collection/handling, submission of specimen other than nasopharyngeal swab, presence of viral mutation(s) within the areas targeted by this assay, and inadequate number of viral copies(<138 copies/mL). A negative result must be combined with clinical observations, patient history, and epidemiological information. The expected result is Negative.  Fact Sheet for Patients:  BloggerCourse.comhttps://www.fda.gov/media/152166/download  Fact Sheet for Healthcare Providers:  SeriousBroker.ithttps://www.fda.gov/media/152162/download  This test is no t yet approved or cleared by the [place] FDA and  has been authorized for detection and/or diagnosis of  SARS-CoV-2 by FDA under an Emergency Use Authorization (EUA). This EUA will remain  in effect (meaning this test can be used) for the duration of the COVID-19 declaration under Section 564(b)(1) of the Act, 21 U.S.C.section 360bbb-3(b)(1), unless the authorization is terminated  or revoked sooner.       Influenza A by PCR NEGATIVE NEGATIVE Final   Influenza B by PCR NEGATIVE NEGATIVE Final    Comment: (NOTE) The Xpert Xpress SARS-CoV-2/FLU/RSV plus assay is intended as an aid in the diagnosis of influenza from Nasopharyngeal swab specimens and should not be used as a sole basis for treatment. Nasal washings and aspirates are unacceptable  for Xpert Xpress SARS-CoV-2/FLU/RSV testing.  Fact Sheet for Patients: BloggerCourse.com  Fact Sheet for Healthcare Providers: SeriousBroker.it  This test is not yet approved or cleared by the [place] FDA and has been authorized for detection and/or diagnosis of SARS-CoV-2 by FDA under an Emergency Use Authorization (EUA). This EUA will remain in effect (meaning this test can be used) for the duration of the COVID-19 declaration under Section 564(b)(1) of the Act, 21 U.S.C. section 360bbb-3(b)(1), unless the authorization is terminated or revoked.  Performed at [facility], [address] [place], [place] [address]   Culture, sputum-assessment     Status: None   Collection Time: 04/26/21  4:08 PM   Specimen: Nasopharyngeal Swab; Sputum  Result Value Ref Range Status   Specimen Description EXPECTORATED SPUTUM  Final   Special Requests NONE  Final   Sputum evaluation   Final    THIS SPECIMEN IS ACCEPTABLE FOR SPUTUM CULTURE Performed at [facility], [address] [place], [place] [address]    Report Status 04/27/2021 FINAL  Final  Culture, Respiratory w Gram Stain     Status: None   Collection Time: 04/26/21  4:08 PM  Result Value Ref Range Status   Specimen  Description   Final    EXPECTORATED SPUTUM Performed at [facility], [address], [place], [place] [address]    Special Requests   Final    NONE Reflexed from [id number] Performed at [facility], [address] [place], [place] [address]    Gram Stain   Final    ABUNDANT WBC PRESENT,BOTH PMN AND MONONUCLEAR FEW GRAM POSITIVE COCCI    Culture   Final    RARE Consistent with normal respiratory flora. NO STAPHYLOCOCCUS AUREUS ISOLATED Performed at [facility], [address] [place], [place] [address]    Report Status 04/28/2021 FINAL  Final  MRSA PCR Screening     Status: None   Collection Time: 04/28/21 10:04 AM   Specimen: Nasopharyngeal  Result Value Ref Range Status   MRSA by PCR NEGATIVE NEGATIVE Final    Comment:        The GeneXpert MRSA Assay (FDA approved for NASAL specimens only), is one component of a comprehensive MRSA colonization surveillance program. It is not intended to diagnose MRSA infection nor to guide or monitor treatment for MRSA infections. Performed at [facility], [address], [place], [place] [address]      Radiology Studies: No results found.  Scheduled Meds: . buprenorphine-naloxone  1 tablet Sublingual Q8H  . enoxaparin (LOVENOX) injection  40 mg Subcutaneous Q24H  . ipratropium-albuterol  3 mL Nebulization TID  . methylPREDNISolone (SOLU-MEDROL) injection  40 mg Intravenous Q12H  . metroNIDAZOLE  500 mg Oral Q8H   Continuous Infusions: . sodium chloride 10 mL/hr at 04/28/21 0723  . ceFEPime (MAXIPIME) IV 2 g (04/28/21 1404)     LOS: 2 days   Time spent: 35 minutes. More than 50% of the time was spent in counseling/coordination of care  Arnetha Courser, MD [facility]  If 7PM-7AM, please contact night-coverage [email or web address]  04/28/2021, 2:40 PM   This record has been created using Conservation officer, historic buildings. Errors have been sought and corrected,but may not always be  located. Such creation errors do not reflect on the standard of care.

## 2021-04-28 NOTE — Progress Notes (Signed)
Pulmonary Medicine Progress Note  Follow-up: Necrotizing pneumonia left lower lobe  Patient Details:    Frank Calderon is an 70 y.o. male former smoker, presented on 20 May with increasing shortness of breath after recent hospitalization.  Found to have retracing pneumonia of the left lower lobe superimposed on very severe COPD.  Lines, Airways, Drains: PICC Single Lumen 04/28/21 Right Brachial 38 cm 0 cm (Active)  Indication for Insertion or Continuance of Line Limited venous access - need for IV therapy >5 days (PICC only) 04/28/21 1652  Exposed Catheter (cm) 0 cm 04/28/21 1652  Site Assessment Clean;Dry;Intact 04/28/21 1652  Line Status Flushed;Saline locked;Blood return noted 04/28/21 1652  Dressing Type Transparent 04/28/21 1652  Dressing Status Clean;Dry;Intact 04/28/21 1652  Antimicrobial disc in place? Yes 04/28/21 1652  Safety Lock Not Applicable 04/28/21 1652  Line Care Connections checked and tightened 04/28/21 1652  Line Adjustment (NICU/IV Team Only) No 04/28/21 1652  Dressing Intervention New dressing 04/28/21 1652  Dressing Change Due 05/05/21 04/28/21 1652    Anti-infectives:  Anti-infectives (From admission, onward)   Start     Dose/Rate Route Frequency Ordered Stop   04/26/21 2200  vancomycin (VANCOREADY) IVPB 750 mg/150 mL  Status:  Discontinued        750 mg 150 mL/hr over 60 Minutes Intravenous Every 12 hours 04/26/21 1544 04/26/21 1548   04/26/21 2200  ceFEPIme (MAXIPIME) 2 g in sodium chloride 0.9 % 100 mL IVPB        2 g 200 mL/hr over 30 Minutes Intravenous Every 8 hours 04/26/21 1544     04/26/21 1600  metroNIDAZOLE (FLAGYL) IVPB 500 mg  Status:  Discontinued        500 mg 100 mL/hr over 60 Minutes Intravenous Every 8 hours 04/26/21 1512 04/26/21 1543   04/26/21 1545  metroNIDAZOLE (FLAGYL) tablet 500 mg        500 mg Oral Every 8 hours 04/26/21 1543     04/26/21 1330  vancomycin (VANCOREADY) IVPB 1250 mg/250 mL        1,250 mg 166.7 mL/hr over 90  Minutes Intravenous  Once 04/26/21 1325 04/26/21 1608   04/26/21 1315  vancomycin (VANCOCIN) IVPB 1000 mg/200 mL premix  Status:  Discontinued        1,000 mg 200 mL/hr over 60 Minutes Intravenous  Once 04/26/21 1314 04/26/21 1325   04/26/21 1315  ceFEPIme (MAXIPIME) 2 g in sodium chloride 0.9 % 100 mL IVPB        2 g 200 mL/hr over 30 Minutes Intravenous  Once 04/26/21 1314 04/26/21 1418       Scheduled Meds: . buprenorphine-naloxone  1 tablet Sublingual Q8H  . Chlorhexidine Gluconate Cloth  6 each Topical Daily  . enoxaparin (LOVENOX) injection  40 mg Subcutaneous Q24H  . ipratropium-albuterol  3 mL Nebulization TID  . methylPREDNISolone (SOLU-MEDROL) injection  40 mg Intravenous Q12H  . metroNIDAZOLE  500 mg Oral Q8H  . sodium chloride flush  10-40 mL Intracatheter Q12H   Continuous Infusions: . sodium chloride Stopped (04/28/21 1404)  . ceFEPime (MAXIPIME) IV 2 g (04/28/21 1404)   PRN Meds:.sodium chloride, acetaminophen, albuterol, dextromethorphan-guaiFENesin, ondansetron (ZOFRAN) IV, sodium chloride flush    Microbiology: Results for orders placed or performed during the hospital encounter of 04/26/21  Culture, blood (single)     Status: None (Preliminary result)   Collection Time: 04/26/21  1:54 PM   Specimen: BLOOD  Result Value Ref Range Status   Specimen Description BLOOD RIGHT ANTECUBITAL  Final  Special Requests   Final    BOTTLES DRAWN AEROBIC AND ANAEROBIC Blood Culture results may not be optimal due to an excessive volume of blood received in culture bottles   Culture   Final    NO GROWTH 2 DAYS Performed at [facility], [address], [place], [place] [address]    Report Status PENDING  Incomplete  Resp Panel by RT-PCR (Flu A&B, Covid) Nasopharyngeal Swab     Status: None   Collection Time: 04/26/21  4:08 PM   Specimen: Nasopharyngeal Swab; Nasopharyngeal(NP) swabs in vial transport medium  Result Value Ref Range Status   SARS Coronavirus 2  by RT PCR NEGATIVE NEGATIVE Final    Comment: (NOTE) SARS-CoV-2 target nucleic acids are NOT DETECTED.  The SARS-CoV-2 RNA is generally detectable in upper respiratory specimens during the acute phase of infection. The lowest concentration of SARS-CoV-2 viral copies this assay can detect is 138 copies/mL. A negative result does not preclude SARS-Cov-2 infection and should not be used as the sole basis for treatment or other patient management decisions. A negative result may occur with  improper specimen collection/handling, submission of specimen other than nasopharyngeal swab, presence of viral mutation(s) within the areas targeted by this assay, and inadequate number of viral copies(<138 copies/mL). A negative result must be combined with clinical observations, patient history, and epidemiological information. The expected result is Negative.  Fact Sheet for Patients:  BloggerCourse.com  Fact Sheet for Healthcare Providers:  SeriousBroker.it  This test is no t yet approved or cleared by the [place] FDA and  has been authorized for detection and/or diagnosis of SARS-CoV-2 by FDA under an Emergency Use Authorization (EUA). This EUA will remain  in effect (meaning this test can be used) for the duration of the COVID-19 declaration under Section 564(b)(1) of the Act, 21 U.S.C.section 360bbb-3(b)(1), unless the authorization is terminated  or revoked sooner.       Influenza A by PCR NEGATIVE NEGATIVE Final   Influenza B by PCR NEGATIVE NEGATIVE Final    Comment: (NOTE) The Xpert Xpress SARS-CoV-2/FLU/RSV plus assay is intended as an aid in the diagnosis of influenza from Nasopharyngeal swab specimens and should not be used as a sole basis for treatment. Nasal washings and aspirates are unacceptable for Xpert Xpress SARS-CoV-2/FLU/RSV testing.  Fact Sheet for Patients: BloggerCourse.com  Fact  Sheet for Healthcare Providers: SeriousBroker.it  This test is not yet approved or cleared by the [place] FDA and has been authorized for detection and/or diagnosis of SARS-CoV-2 by FDA under an Emergency Use Authorization (EUA). This EUA will remain in effect (meaning this test can be used) for the duration of the COVID-19 declaration under Section 564(b)(1) of the Act, 21 U.S.C. section 360bbb-3(b)(1), unless the authorization is terminated or revoked.  Performed at [facility], [address] [place], [place] [address]   Culture, sputum-assessment     Status: None   Collection Time: 04/26/21  4:08 PM   Specimen: Nasopharyngeal Swab; Sputum  Result Value Ref Range Status   Specimen Description EXPECTORATED SPUTUM  Final   Special Requests NONE  Final   Sputum evaluation   Final    THIS SPECIMEN IS ACCEPTABLE FOR SPUTUM CULTURE Performed at [facility], [address] [place], [place] [address]    Report Status 04/27/2021 FINAL  Final  Culture, Respiratory w Gram Stain     Status: None   Collection Time: 04/26/21  4:08 PM  Result Value Ref Range Status  Specimen Description   Final    EXPECTORATED SPUTUM Performed at [facility], [address] [place], [place] [address]    Special Requests   Final    NONE Reflexed from [id number] Performed at [facility], [address] [place], [place] [address]    Gram Stain   Final    ABUNDANT WBC PRESENT,BOTH PMN AND MONONUCLEAR FEW GRAM POSITIVE COCCI    Culture   Final    RARE Consistent with normal respiratory flora. NO STAPHYLOCOCCUS AUREUS ISOLATED Performed at [facility], [address], [place], [place] [address]    Report Status 04/28/2021 FINAL  Final  MRSA PCR Screening     Status: None   Collection Time: 04/28/21 10:04 AM   Specimen: Nasopharyngeal  Result Value Ref Range Status   MRSA by PCR NEGATIVE NEGATIVE Final    Comment:         The GeneXpert MRSA Assay (FDA approved for NASAL specimens only), is one component of a comprehensive MRSA colonization surveillance program. It is not intended to diagnose MRSA infection nor to guide or monitor treatment for MRSA infections. Performed at [facility], [address] [place], [place] [address]    Results for orders placed or performed during the hospital encounter of 04/26/21 (from the past 48 hour(s))  Basic metabolic panel     Status: Abnormal   Collection Time: 04/27/21  4:36 AM  Result Value Ref Range   Sodium 134 (L) 135 - 145 mmol/L   Potassium 3.7 3.5 - 5.1 mmol/L   Chloride 103 98 - 111 mmol/L   CO2 26 22 - 32 mmol/L   Glucose, Bld 117 (H) 70 - 99 mg/dL    Comment: Glucose reference range applies only to samples taken after fasting for at least 8 hours.   BUN 19 8 - 23 mg/dL   Creatinine, Ser 3.08 (L) 0.61 - 1.24 mg/dL   Calcium 7.6 (L) 8.9 - 10.3 mg/dL   GFR, Estimated >65 >78 mL/min    Comment: (NOTE) Calculated using the CKD-EPI Creatinine Equation (2021)    Anion gap 5 5 - 15    Comment: Performed at [facility], [address] [place], [place] [address]  CBC     Status: Abnormal   Collection Time: 04/27/21  4:36 AM  Result Value Ref Range   WBC 11.9 (H) 4.0 - 10.5 K/uL   RBC 3.43 (L) 4.22 - 5.81 MIL/uL   Hemoglobin 9.6 (L) 13.0 - 17.0 g/dL   HCT 95.2 (L) 84.1 - 32.4 %   MCV 85.7 80.0 - 100.0 fL   MCH 28.0 26.0 - 34.0 pg   MCHC 32.7 30.0 - 36.0 g/dL   RDW 40.1 (H) 02.7 - 25.3 %   Platelets 278 150 - 400 K/uL   nRBC 0.0 0.0 - 0.2 %    Comment: Performed at [facility], [address], [place], [place] [address]  MRSA PCR Screening     Status: None   Collection Time: 04/28/21 10:04 AM   Specimen: Nasopharyngeal  Result Value Ref Range   MRSA by PCR NEGATIVE NEGATIVE    Comment:        The GeneXpert MRSA Assay (FDA approved for NASAL specimens only), is one component of a comprehensive MRSA  colonization surveillance program. It is not intended to diagnose MRSA infection nor to guide or monitor treatment for MRSA infections. Performed at [facility], [address] [place], [place] [address]  Troponin I (High Sensitivity)     Status: None   Collection Time: 04/28/21  2:58 PM  Result Value Ref Range   Troponin I (High Sensitivity) 11 <18 ng/L    Comment: (NOTE) Elevated high sensitivity troponin I (hsTnI) values and significant  changes across serial measurements may suggest ACS but many other  chronic and acute conditions are known to elevate hsTnI results.  Refer to the "Links" section for chest pain algorithms and additional  guidance. Performed at [facility], [address] [place], [place] [address]   Troponin I (High Sensitivity)     Status: None   Collection Time: 04/28/21  4:45 PM  Result Value Ref Range   Troponin I (High Sensitivity) 6 <18 ng/L    Comment: (NOTE) Elevated high sensitivity troponin I (hsTnI) values and significant  changes across serial measurements may suggest ACS but many other  chronic and acute conditions are known to elevate hsTnI results.  Refer to the "Links" section for chest pain algorithms and additional  guidance. Performed at [facility], [address] [place], [place] [address]     Best Practice/Protocols:  VTE Prophylaxis: Lovenox (prophylaxtic dose)   Studies: DG Chest 2 View  Result Date: 04/26/2021 CLINICAL DATA:  Shortness of breath.  History of pneumonia. EXAM: CHEST - 2 VIEW COMPARISON:  Chest x-ray 04/17/2021 FINDINGS: The cardiac silhouette, mediastinal and hilar contours are within normal limits is stable. There is mild tortuosity and calcification of the thoracic aorta. Persistent extensive left lower lobe airspace process. Underlying emphysematous changes are noted but could not exclude cavitary process in the left lower lobe. Findings could suggest cavitary pneumonia or  lung abscesses. Tumor would be another possibility. Recommend chest CT for further evaluation. The right lung remains relatively clear. IMPRESSION: Persistent extensive left lower lobe airspace process. Findings could suggest cavitary pneumonia or lung abscesses. Neoplastic process would be another possibility. Recommend chest CT with contrast for further evaluation. Electronically Signed   By: Rudie MeyerP.  Gallerani M.D.   On: 04/26/2021 12:26   CT Chest W Contrast  Result Date: 04/26/2021 CLINICAL DATA:  Pneumonia in a 70 year old male. EXAM: CT CHEST WITH CONTRAST TECHNIQUE: Multidetector CT imaging of the chest was performed during intravenous contrast administration. CONTRAST:  75mL OMNIPAQUE IOHEXOL 300 MG/ML  SOLN COMPARISON:  Chest x-ray of Apr 26, 2021. FINDINGS: Cardiovascular: Heart size normal. No substantial pericardial effusion. Aortic valvular calcifications. Aortic atherosclerosis. Normal caliber central pulmonary vessels. Mediastinum/Nodes: Esophagus mildly patulous. RIGHT paratracheal lymph node measuring 1 cm. Scattered nodes elsewhere throughout the chest, largest is a subcarinal lymph node measuring 1.5 cm short axis (image 80/2) no axillary lymphadenopathy. No supraclavicular adenopathy. Precarinal lymph node on image 65/2 measuring 1 cm. Mild enlargement of RIGHT hilar nodal tissue at 11 mm. Fullness of LEFT hilar nodal tissue as well, largest measuring approximately 11 mm (image 82/2) Lungs/Pleura: Severe pulmonary emphysema. Nodular opacities at the RIGHT lung base. Material within lower lobe bronchi bilaterally. Some ground-glass and septal thickening in the lingula. Dense basilar consolidation in the LEFT chest with multiple air-fluid levels involving the entire LEFT lower lobe aside from a portion of the superior segment. Trace LEFT-sided effusion. Upper Abdomen: Incidental imaging of upper abdominal contents without acute process. Musculoskeletal: No acute musculoskeletal finding. No  destructive bone process. IMPRESSION: 1. Necrotic pneumonia likely due to aspiration in the LEFT lower lobe with multiple fluid filled cavities superimposed on a background of pulmonary emphysema. 2. Associated LEFT-sided pleural effusion is very small volume  and without current pleural enhancement. The patient would be at risk for developing empyema, therefore would suggest attention on follow-up. 3. Pneumonitis at the RIGHT lung base as well without frank consolidative changes. 4. Nodal enlargement in the chest likely reactive, consider three-month follow-up to ensure resolution of above findings and assess lymph nodes. 5. Severe pulmonary emphysema. 6. Emphysema and aortic atherosclerosis. Aortic Atherosclerosis (ICD10-I70.0) and Emphysema (ICD10-J43.9). Electronically Signed   By: Donzetta Kohut M.D.   On: 04/26/2021 14:28   DG Chest Port 1 View  Result Date: 04/17/2021 CLINICAL DATA:  Shortness of breath, cough productive of green sputum, history COPD EXAM: PORTABLE CHEST 1 VIEW COMPARISON:  Portable exam 0948 hours compared to 04/10/2021 FINDINGS: Normal heart size, mediastinal contours, and pulmonary vascularity. Atherosclerotic calcification aorta. Emphysematous and bronchitic changes consistent with COPD. New LEFT basilar consolidation consistent with pneumonia. Minimal chronic atelectasis at RIGHT base. No pleural effusion or pneumothorax. Bones demineralized. IMPRESSION: COPD changes with new LEFT basilar consolidation consistent with pneumonia . Electronically Signed   By: Ulyses Southward M.D.   On: 04/17/2021 10:16   DG Chest Portable 1 View  Result Date: 04/10/2021 CLINICAL DATA:  Shortness of breath. EXAM: PORTABLE CHEST 1 VIEW COMPARISON:  None. FINDINGS: The lungs are mildly hyperinflated. Moderate severity emphysematous lung disease is seen. Mild to moderate severity areas of atelectasis and/or early infiltrate are also seen within the bilateral lung bases. There is no evidence of a pleural  effusion or pneumothorax. The heart size and mediastinal contours are within normal limits. Degenerative changes seen throughout the thoracic spine. IMPRESSION: COPD with mild to moderate severity bibasilar atelectasis and/or early infiltrate. Electronically Signed   By: Aram Candela M.D.   On: 04/10/2021 23:27   [place] EKG SITE RITE  Result Date: 04/28/2021 If [facility] image not attached, placement could not be confirmed due to current cardiac rhythm.   Consults: Treatment Team:  Salena Saner, MD   Subjective:    Overnight Issues: No overnight issues.  States he feels better today.  Still expectorating copious amounts of sputum.  Had PICC line placed today.  Objective:  Vital signs for last 24 hours: Temp:  [97.5 F (36.4 C)-98.1 F (36.7 C)] 97.5 F (36.4 C) (05/22 1548) Pulse Rate:  [60-77] 64 (05/22 1548) Resp:  [16-18] 16 (05/22 1548) BP: (106-130)/(62-68) 123/64 (05/22 1548) SpO2:  [96 %-99 %] 98 % (05/22 1548) Weight:  [63.7 kg] 63.7 kg (05/22 0830)   Intake/Output from previous day: 05/21 0701 - 05/22 0700 In: 2706.1 [P.O.:1320; I.V.:1086.1; IV Piggyback:300] Out: 2075 [Urine:2075]  Intake/Output this shift: Total I/O In: 697.8 [P.O.:360; I.V.:137.8; IV Piggyback:200] Out: 775 [Urine:775]  Physical Exam:  GENERAL: Chronically ill-appearing male, no acute distress, no overt use of accessories today. HEAD: Normocephalic, atraumatic.  EYES: Pupils equal, round, reactive to light.  No scleral icterus.  MOUTH: Oral mucosa moist.  No thrush. NECK: Supple. No thyromegaly. Trachea midline. No JVD.  No adenopathy. PULMONARY:  Distant breath sounds, symmetrical air entry.  Better aeration today.  No wheezes noted.  No rhonchi.  CARDIOVASCULAR: S1 and S2. Regular rate and rhythm.  No rubs, murmurs or gallops heard. ABDOMEN: Scaphoid, benign. MUSCULOSKELETAL: No joint deformity, no clubbing, no edema.  NEUROLOGIC: No focal deficit, no gait disturbance, speech is  fluent. SKIN: Intact,warm,dry.  On limited exam no rashes PSYCH: Mood and behavior normal.  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 steroids in the morning taper over 7 to 10 days Had PICC line placement for IV antibiotic management Minimum antibiotic treatment will be 21 days depending on radiographic response No indication for bronchoscopy at this point as it will not add to diagnosis/management Encourage use of flutter valve for secretion clearance Patient appears to be expectorating well  Very severe GOLD class IV COPD Bronchiectasis Continue nebulization treatments Would continue DuoNeb 3-4 times a day upon discharge Transition to p.o. steroids Flutter valve/pulmonary hygiene  Acute on chronic respiratory failure Secondary pulmonary hypertension Continue oxygen supplementation Patient reluctant to continue oxygen at home Discussed importance of oxygen therapy particularly in the setting of PAH Suspect oxygen requirement will be permanent  Thank you for allowing [place] to participate in this patient's care.  Patient indicates that he would want to continue following with his pulmonologist at [facility], Dr. Dudley Major.  We will sign off please reconsult as needed   LOS: 2 days    C. Danice Goltz, MD [facility] 04/28/2021  *This note was dictated using voice recognition software/Dragon.  Despite best efforts to proofread, errors can occur which can change the meaning.  Any change was purely unintentional.

## 2021-04-29 DIAGNOSIS — J9621 Acute and chronic respiratory failure with hypoxia: Secondary | ICD-10-CM

## 2021-04-29 DIAGNOSIS — J850 Gangrene and necrosis of lung: Secondary | ICD-10-CM

## 2021-04-29 DIAGNOSIS — J441 Chronic obstructive pulmonary disease with (acute) exacerbation: Secondary | ICD-10-CM | POA: Diagnosis not present

## 2021-04-29 DIAGNOSIS — J690 Pneumonitis due to inhalation of food and vomit: Secondary | ICD-10-CM | POA: Diagnosis not present

## 2021-04-29 DIAGNOSIS — Z515 Encounter for palliative care: Secondary | ICD-10-CM

## 2021-04-29 DIAGNOSIS — Z66 Do not resuscitate: Secondary | ICD-10-CM

## 2021-04-29 DIAGNOSIS — Z7189 Other specified counseling: Secondary | ICD-10-CM

## 2021-04-29 MED ORDER — IPRATROPIUM-ALBUTEROL 0.5-2.5 (3) MG/3ML IN SOLN
3.0000 mL | Freq: Two times a day (BID) | RESPIRATORY_TRACT | Status: DC
  Administered 2021-04-30: 3 mL via RESPIRATORY_TRACT
  Filled 2021-04-29: qty 3

## 2021-04-29 NOTE — Progress Notes (Signed)
PROGRESS NOTE    Noah Lembke  SNK:[id number] DOB: [birth year] DOA: 04/26/2021 PCP: [facility]   Brief Narrative: Taken from H&P. Harvey Matlack is a 70 y.o. male with medical history significant of COPD on 2L O2, chronic pain on Suboxone, who presents with SOB.  Patient was recently hospitalized from 5/11 to 5/15 due to CAP and COPD exacerbation.  Patient was discharged on Levaquin and 2 L oxygen.  Sputum culture on 04/19/2021 was positive for Pseudomonas. Patient states that he has been having progressively worsening shortness in the past 5 days.  He also has pleuritic chest pain, which is located in the left side of chest, constant, sharp, nonradiating, aggravated by deep breath and coughing.  He has productive cough with yellow-colored mucus production.  Denies fever or chills.   Hemodynamically stable.  CT chest with concern of necrotizing pneumonia and severe emphysema with bullous disease.  After discussing with Dr. Luciana Axe from ID he was started on cefepime and Flagyl for pseudomonal and anaerobic coverage. Pulmonary was also consulted today-patient will need long-term antibiotics with his current advanced underlying lung disease with necrotizing pneumonia. Pulmonary is also suggesting involving palliative care to discuss goals of care due to advanced underlying lung disease.  Patient is very high risk for deterioration and death.  Subjective: Patient was seen and examined today.  Continues to have some exertional dyspnea.  No other complaints.  Assessment & Plan:   Principal Problem:   Acute on chronic respiratory failure with hypoxia (HCC) Active Problems:   COPD with acute exacerbation (HCC)   HCAP (healthcare-associated pneumonia)   Aspiration pneumonia (HCC)   Chronic pain  Acute on chronic hypoxic respiratory failure secondary to necrotizing pneumonia.  Patient with advanced COPD and bullous emphysematous disease.  CT chest concerning for necrotizing  pneumonia.  Recent sputum culture positive for Pseudomonas. ID was consulted by admitting provider and suggesting cefepime with Flagyl for pseudomonal and anaerobic coverage as there was also some concern of aspiration. Patient did not meet criteria for sepsis.  Blood culture and procalcitonin negative.  Repeat sputum cultures with normal respiratory flora Pulmonary was consulted -appreciate their recommendations. -Continue with cefepime and Flagyl-pulmonary is advising continue cefepime for total of 3 weeks. -Continue with Solu-Medrol -Follow-up repeat sputum cultures -Follow-up supportive care. -Continue his supplemental oxygen to keep the saturation above 88% -Message sent to ID further recommendations in terms of type and duration of antibiotics for discharge.  Chest pain.  Most likely secondary to necrotizing pneumonia, no EKG changes and troponin remain negative  Chronic pain. -Continue home Suboxone  Objective: Vitals:   04/28/21 2019 04/28/21 2037 04/29/21 0516 04/29/21 1422  BP: (!) 104/57  (!) 106/59 (!) 99/58  Pulse: 74  63 73  Resp: 20  16 18   Temp: 97.8 F (36.6 C)  98.1 F (36.7 C) 97.8 F (36.6 C)  TempSrc: Oral  Oral   SpO2: 96% 96% 97% 97%  Weight:      Height:        Intake/Output Summary (Last 24 hours) at 04/29/2021 1504 Last data filed at 04/29/2021 1345 Gross per 24 hour  Intake 1226.05 ml  Output 1600 ml  Net -373.95 ml   Filed Weights   04/26/21 1129 04/27/21 0723 04/28/21 0830  Weight: 63.5 kg 63.6 kg 63.7 kg    Examination:  General.  Well-developed gentleman, in no acute distress. Pulmonary.  Lungs clear bilaterally with mildly decreased breath sounds, normal respiratory effort. CV.  Regular rate and rhythm, no  JVD, rub or murmur. Abdomen.  Soft, nontender, nondistended, BS positive. CNS.  Alert and oriented x3.  No focal neurologic deficit. Extremities.  No edema, no cyanosis, pulses intact and symmetrical. Psychiatry.  Judgment and  insight appears normal.  DVT prophylaxis: Lovenox Code Status: Full Family Communication: Discussed with patient Disposition Plan:  Status is: Inpatient  Remains inpatient appropriate because:Inpatient level of care appropriate due to severity of illness   Dispo: The patient is from: Home              Anticipated d/c is to: Home              Patient currently is not medically stable to d/c.   Difficult to place patient No              Level of care: Progressive Cardiac  All the records are reviewed and case discussed with Care Management/Social Worker. Management plans discussed with the patient, nursing and they are in agreement.  Consultants:   ID  Pulmonology  Procedures:  Antimicrobials:  Cefepime Flagyl  Data Reviewed: I have personally reviewed following labs and imaging studies  CBC: Recent Labs  Lab 04/26/21 1132 04/27/21 0436  WBC 13.7* 11.9*  NEUTROABS 11.8*  --   HGB 11.4* 9.6*  HCT 35.4* 29.4*  MCV 86.6 85.7  PLT 325 278   Basic Metabolic Panel: Recent Labs  Lab 04/26/21 1132 04/27/21 0436  NA 133* 134*  K 3.8 3.7  CL 96* 103  CO2 29 26  GLUCOSE 90 117*  BUN 22 19  CREATININE 0.60* 0.57*  CALCIUM 8.1* 7.6*   GFR: Estimated Creatinine Clearance: 79.6 mL/min (A) (by C-G formula based on SCr of 0.57 mg/dL (L)). Liver Function Tests: No results for input(s): AST, ALT, ALKPHOS, BILITOT, PROT, ALBUMIN in the last 168 hours. No results for input(s): LIPASE, AMYLASE in the last 168 hours. No results for input(s): AMMONIA in the last 168 hours. Coagulation Profile: Recent Labs  Lab 04/26/21 1608  INR 1.2   Cardiac Enzymes: No results for input(s): CKTOTAL, CKMB, CKMBINDEX, TROPONINI in the last 168 hours. BNP (last 3 results) No results for input(s): PROBNP in the last 8760 hours. HbA1C: No results for input(s): HGBA1C in the last 72 hours. CBG: No results for input(s): GLUCAP in the last 168 hours. Lipid Profile: No results for  input(s): CHOL, HDL, LDLCALC, TRIG, CHOLHDL, LDLDIRECT in the last 72 hours. Thyroid Function Tests: No results for input(s): TSH, T4TOTAL, FREET4, T3FREE, THYROIDAB in the last 72 hours. Anemia Panel: No results for input(s): VITAMINB12, FOLATE, FERRITIN, TIBC, IRON, RETICCTPCT in the last 72 hours. Sepsis Labs: Recent Labs  Lab 04/26/21 1608 04/26/21 1758  PROCALCITON <0.10  --   LATICACIDVEN 0.9 1.2    Recent Results (from the past 240 hour(s))  Culture, blood (single)     Status: None (Preliminary result)   Collection Time: 04/26/21  1:54 PM   Specimen: BLOOD  Result Value Ref Range Status   Specimen Description BLOOD RIGHT ANTECUBITAL  Final   Special Requests   Final    BOTTLES DRAWN AEROBIC AND ANAEROBIC Blood Culture results may not be optimal due to an excessive volume of blood received in culture bottles   Culture   Final    NO GROWTH 2 DAYS Performed at [facility], [address] [place], [place] [address]    Report Status PENDING  Incomplete  Resp Panel by RT-PCR (Flu A&B, Covid) Nasopharyngeal Swab     Status:  None   Collection Time: 04/26/21  4:08 PM   Specimen: Nasopharyngeal Swab; Nasopharyngeal(NP) swabs in vial transport medium  Result Value Ref Range Status   SARS Coronavirus 2 by RT PCR NEGATIVE NEGATIVE Final    Comment: (NOTE) SARS-CoV-2 target nucleic acids are NOT DETECTED.  The SARS-CoV-2 RNA is generally detectable in upper respiratory specimens during the acute phase of infection. The lowest concentration of SARS-CoV-2 viral copies this assay can detect is 138 copies/mL. A negative result does not preclude SARS-Cov-2 infection and should not be used as the sole basis for treatment or other patient management decisions. A negative result may occur with  improper specimen collection/handling, submission of specimen other than nasopharyngeal swab, presence of viral mutation(s) within the areas targeted by this assay, and inadequate  number of viral copies(<138 copies/mL). A negative result must be combined with clinical observations, patient history, and epidemiological information. The expected result is Negative.  Fact Sheet for Patients:  BloggerCourse.comhttps://www.fda.gov/media/152166/download  Fact Sheet for Healthcare Providers:  SeriousBroker.ithttps://www.fda.gov/media/152162/download  This test is no t yet approved or cleared by the [place] FDA and  has been authorized for detection and/or diagnosis of SARS-CoV-2 by FDA under an Emergency Use Authorization (EUA). This EUA will remain  in effect (meaning this test can be used) for the duration of the COVID-19 declaration under Section 564(b)(1) of the Act, 21 U.S.C.section 360bbb-3(b)(1), unless the authorization is terminated  or revoked sooner.       Influenza A by PCR NEGATIVE NEGATIVE Final   Influenza B by PCR NEGATIVE NEGATIVE Final    Comment: (NOTE) The Xpert Xpress SARS-CoV-2/FLU/RSV plus assay is intended as an aid in the diagnosis of influenza from Nasopharyngeal swab specimens and should not be used as a sole basis for treatment. Nasal washings and aspirates are unacceptable for Xpert Xpress SARS-CoV-2/FLU/RSV testing.  Fact Sheet for Patients: BloggerCourse.comhttps://www.fda.gov/media/152166/download  Fact Sheet for Healthcare Providers: SeriousBroker.ithttps://www.fda.gov/media/152162/download  This test is not yet approved or cleared by the [place] FDA and has been authorized for detection and/or diagnosis of SARS-CoV-2 by FDA under an Emergency Use Authorization (EUA). This EUA will remain in effect (meaning this test can be used) for the duration of the COVID-19 declaration under Section 564(b)(1) of the Act, 21 U.S.C. section 360bbb-3(b)(1), unless the authorization is terminated or revoked.  Performed at [facility], [address] [place], [place] [address]   Culture, sputum-assessment     Status: None   Collection Time: 04/26/21  4:08 PM   Specimen:  Nasopharyngeal Swab; Sputum  Result Value Ref Range Status   Specimen Description EXPECTORATED SPUTUM  Final   Special Requests NONE  Final   Sputum evaluation   Final    THIS SPECIMEN IS ACCEPTABLE FOR SPUTUM CULTURE Performed at [facility], [address] [place], [place] [address]    Report Status 04/27/2021 FINAL  Final  Culture, Respiratory w Gram Stain     Status: None   Collection Time: 04/26/21  4:08 PM  Result Value Ref Range Status   Specimen Description   Final    EXPECTORATED SPUTUM Performed at [facility], [address] [place], [place] [address]    Special Requests   Final    NONE Reflexed from [id number] Performed at [facility], [address] [place], [place] [address]    Gram Stain   Final    ABUNDANT WBC PRESENT,BOTH PMN AND MONONUCLEAR FEW GRAM POSITIVE COCCI    Culture   Final  RARE Consistent with normal respiratory flora. NO STAPHYLOCOCCUS AUREUS ISOLATED Performed at [facility], [address], [place], [place] [address]    Report Status 04/28/2021 FINAL  Final  MRSA PCR Screening     Status: None   Collection Time: 04/28/21 10:04 AM   Specimen: Nasopharyngeal  Result Value Ref Range Status   MRSA by PCR NEGATIVE NEGATIVE Final    Comment:        The GeneXpert MRSA Assay (FDA approved for NASAL specimens only), is one component of a comprehensive MRSA colonization surveillance program. It is not intended to diagnose MRSA infection nor to guide or monitor treatment for MRSA infections. Performed at [facility], [address], [place], [place] [address]      Radiology Studies: [place] EKG SITE RITE  Result Date: 04/28/2021 If [facility] image not attached, placement could not be confirmed due to current cardiac rhythm.   Scheduled Meds: . buprenorphine-naloxone  1 tablet Sublingual Q8H  . Chlorhexidine Gluconate Cloth  6 each Topical Daily  . enoxaparin (LOVENOX) injection  40 mg  Subcutaneous Q24H  . ipratropium-albuterol  3 mL Nebulization TID  . methylPREDNISolone (SOLU-MEDROL) injection  40 mg Intravenous Q12H  . metroNIDAZOLE  500 mg Oral Q8H  . sodium chloride flush  10-40 mL Intracatheter Q12H   Continuous Infusions: . sodium chloride Stopped (04/28/21 1404)  . ceFEPime (MAXIPIME) IV 2 g (04/29/21 1427)     LOS: 3 days   Time spent: 30 minutes. More than 50% of the time was spent in counseling/coordination of care  Arnetha Courser, MD [facility]  If 7PM-7AM, please contact night-coverage [email or web address]  04/29/2021, 3:04 PM   This record has been created using Conservation officer, historic buildings. Errors have been sought and corrected,but may not always be located. Such creation errors do not reflect on the standard of care.

## 2021-04-29 NOTE — Plan of Care (Signed)

## 2021-04-29 NOTE — Consult Note (Signed)
Consultation Note Date: 04/29/2021   Patient Name: Frank Calderon  DOB: 18-Mar-1951  MRN: [id number]  Age / Sex: 70 y.o., male   PCP: [facility] Referring Physician: Lorella Nimrod, MD   REASON FOR CONSULTATION:Establishing goals of care  Palliative Care consult requested for goals of care discussion in this 70 y.o. male with a medical history significant for neck pain (Suboxone), and COPD (2 L).  Patient presented to the ED from home with complaints of shortness of breath and chest pain.  Patient was recently hospitalized and discharged on 5/15 after receiving treatment for community-acquired pneumonia and COPD exacerbation.  He was discharged home on Levaquin and 2 L oxygen.  At that time sputum culture was positive for Pseudomonas.  On work-up CT of chest showed necrotic pneumonia likely due to aspiration in the left lower lobe with multiple fluid-filled cavities superimposed on pulmonary emphysema.  Associated left-sided pleural effusion small volume.  Pneumonitis on the right lung base.  Severe pulmonary emphysema.  Currently being followed by pulmonology and infectious disease with recommendations for PICC line placement and IV antibiotics at minimum 21 days.  Clinical Assessment and Goals of Care: I have reviewed medical records including lab results, imaging, Epic notes, and MAR, received report from the bedside RN, and assessed the patient.   I met at the bedside with patient to discuss diagnosis prognosis, GOC, EOL wishes, disposition and options.  He is awake, alert and oriented x3 watching TV.  Complains of some chest discomfort.  States shortness of breath is much better.  I introduced Palliative Medicine as specialized medical care for people living with serious illness. It focuses on providing relief from the symptoms and stress of a serious illness. The goal is to improve quality of life for both the patient and the family.  We discussed a brief life review  of the patient, along with his functional and nutritional status.  Patient reports he is widowed he has 1 daughter who he currently lives with.  He has 1 granddaughter and a son-in-law who is also in the home.  He is a retired Surveyor, minerals.  He also currently works at [employer].  He served more than 8 years in the [place] [employer].  Patient reports prior to current illness and hospitalization he was ambulatory without any assistive devices.  He was able to perform all ADLs independently.  Appetite was good.  Patient states he loves to be on the go which is why he started working part-time at [employer] to allow him to continue with his independence.  He shares prior to hospitalization he worked a full 8-hour shift the same day.  We discussed His current illness and what it means in the larger context of His on-going co-morbidities. Natural disease trajectory and expectations at EOL were discussed.  Mr. seymore brodowski his understanding of current illness and ongoing comorbidities.  He states his quality of life was great prior to current illness and he is hopeful with aggressive medical treatment this will somewhat improve and allow him to return back to his baseline or at least close to it.  A detailed discussion was had today regarding advanced directives.  Concepts specific to code status, artifical feeding and hydration, continued IV antibiotics and rehospitalization.  Patient states he does have advanced directive.  His daughter his son-in-law Elberta Fortis are his names medical decision makers.  Patient states he would not want any artificial feeding/PEG.  We discussed his current full CODE STATUS  at length with consideration of his current illness and comorbidities.  Patient is clear in his understanding and expressed wishes for DNR/DNI.  Education provided on what DNR would look like in the event of a cardiopulmonary emergency.  Patient verbalized understanding again confirming wishes for  DNR/DNI.  Education provided that change will be made based on his expressed wishes.   The difference between a aggressive medical intervention and a palliative comfort care path were discussed at length. Values and goals of care important to patient and family were attempted to be elicited.   Mr. Tetterton is clear in his expressed goals to continue to treat the treatable aggressively as he remains hopeful for improvement/stability.  He is hopeful that he will return back to his baseline or at least close to it.  He states he plans to continue with his life as it was great prior to this current illness and he is not ready to give up.   I discussed the importance of continued conversation with family and their medical providers regarding overall plan of care and treatment options, ensuring decisions are within the context of the patients values and GOCs.  Questions and concerns were addressed.  Patient was encouraged to call with questions or concerns.  PMT will continue to support holistically as needed.   CODE STATUS: DNR  ADVANCE DIRECTIVES: Primary Decision Maker: Patient   SYMPTOM MANAGEMENT: Per attending  Palliative Prophylaxis:   Aspiration and Frequent Pain Assessment  PSYCHO-SOCIAL/SPIRITUAL:  Support System: Family  Desire for further Chaplaincy support: No  Additional Recommendations (Limitations, Scope, Preferences):  No Artificial Feeding and DNR/DNI  Education on hospice/palliative    PAST MEDICAL HISTORY: Past Medical History:  Diagnosis Date  . Chronic pain   . COPD (chronic obstructive pulmonary disease) (HCC)     ALLERGIES:  has No Known Allergies.   MEDICATIONS:  Current Facility-Administered Medications  Medication Dose Route Frequency Provider Last Rate Last Admin  . 0.9 %  sodium chloride infusion   Intravenous PRN Lorella Nimrod, MD   Stopped at 04/28/21 1404  . acetaminophen (TYLENOL) tablet 650 mg  650 mg Oral Q6H PRN Ivor Costa, MD      .  albuterol (PROVENTIL) (2.5 MG/3ML) 0.083% nebulizer solution 2.5 mg  2.5 mg Nebulization Q4H PRN Ivor Costa, MD      . buprenorphine-naloxone (SUBOXONE) 8-2 mg per SL tablet 1 tablet  1 tablet Sublingual Q8H Ivor Costa, MD   1 tablet at 04/29/21 0519  . ceFEPIme (MAXIPIME) 2 g in sodium chloride 0.9 % 100 mL IVPB  2 g Intravenous Q8H Dallie Piles, RPH 200 mL/hr at 04/29/21 0530 2 g at 04/29/21 0530  . Chlorhexidine Gluconate Cloth 2 % PADS 6 each  6 each Topical Daily Lorella Nimrod, MD   6 each at 04/29/21 1000  . dextromethorphan-guaiFENesin (MUCINEX DM) 30-600 MG per 12 hr tablet 1 tablet  1 tablet Oral BID PRN Ivor Costa, MD      . enoxaparin (LOVENOX) injection 40 mg  40 mg Subcutaneous Q24H Ivor Costa, MD   40 mg at 04/28/21 2026  . ipratropium-albuterol (DUONEB) 0.5-2.5 (3) MG/3ML nebulizer solution 3 mL  3 mL Nebulization TID Lorella Nimrod, MD   3 mL at 04/29/21 1345  . methylPREDNISolone sodium succinate (SOLU-MEDROL) 40 mg/mL injection 40 mg  40 mg Intravenous Q12H Ivor Costa, MD   40 mg at 04/29/21 0519  . metroNIDAZOLE (FLAGYL) tablet 500 mg  500 mg Oral Q8H Ivor Costa, MD   500  mg at 04/29/21 0519  . ondansetron (ZOFRAN) injection 4 mg  4 mg Intravenous Q8H PRN Ivor Costa, MD      . sodium chloride flush (NS) 0.9 % injection 10-40 mL  10-40 mL Intracatheter Q12H Lorella Nimrod, MD   10 mL at 04/29/21 1000  . sodium chloride flush (NS) 0.9 % injection 10-40 mL  10-40 mL Intracatheter PRN Lorella Nimrod, MD        VITAL SIGNS: BP (!) 99/58 (BP Location: Left Arm)   Pulse 73   Temp 97.8 F (36.6 C)   Resp 18   Ht $R'5\' 10"'Xq$  (1.778 m)   Wt 63.7 kg   SpO2 97%   BMI 20.16 kg/m  Filed Weights   04/26/21 1129 04/27/21 0723 04/28/21 0830  Weight: 63.5 kg 63.6 kg 63.7 kg    Estimated body mass index is 20.16 kg/m as calculated from the following:   Height as of this encounter: $RemoveBeforeD'5\' 10"'FWwEHgGzeGmQwx$  (1.778 m).   Weight as of this encounter: 63.7 kg.  LABS: CBC:    Component Value Date/Time   WBC  11.9 (H) 04/27/2021 0436   HGB 9.6 (L) 04/27/2021 0436   HCT 29.4 (L) 04/27/2021 0436   PLT 278 04/27/2021 0436   Comprehensive Metabolic Panel:    Component Value Date/Time   NA 134 (L) 04/27/2021 0436   K 3.7 04/27/2021 0436   BUN 19 04/27/2021 0436   CREATININE 0.57 (L) 04/27/2021 0436   ALBUMIN 2.8 (L) 04/17/2021 0939     Review of Systems  All other systems reviewed and are negative. Unless otherwise noted, a complete review of systems is negative.  Physical Exam General: NAD Cardiovascular: regular rate and rhythm Pulmonary: Rhonchi diminished bilaterally  Abdomen: soft, nontender, + bowel sounds Extremities: no edema, no joint deformities Skin: no rashes, warm and dry Neurological: AAOx3, mood appropriate  Prognosis: Guarded  Discharge Planning:  To Be Determined  Recommendations: . DNR/DNI-as requested and confirmed by Mr. Orlowski (form completed and placed on chart) . Continue to treat the treatable aggressively . Patient is clear and expressed goals to continue to treat the treatable aggressively as he remains hopeful for improvement/stability allowing him to return to his baseline or somewhere near it.  States his goal is to return home with his family and continue with his perceived good quality of life. Marland Kitchen PMT will continue to support and follow as needed for ongoing support. Please call team line with urgent needs.   Palliative Performance Scale: PPS 40-50%              Patient expressed understanding and was in agreement with this plan.   Thank you for allowing the Palliative Medicine Team to assist in the care of this patient. Please utilize secure chat with additional questions, if there is no response within 30 minutes please call the above phone number.   Time In: 1110 Time Out: 1200 Time Total: 50 min  Visit consisted of counseling and education dealing with the complex and emotionally intense issues of symptom management and palliative care in  the setting of serious and potentially life-threatening illness.Greater than 50%  of this time was spent counseling and coordinating care related to the above assessment and plan.  Signed by:  Alda Lea, AGPCNP-BC Palliative Medicine Team  Phone: [phone] Pager: [phone] Amion: [facility] Team providers are available by phone from 7am to 7pm daily and can be reached through the team cell phone.  Should this patient require  assistance outside of these hours, please call the patient's attending physician.

## 2021-04-29 NOTE — Progress Notes (Signed)
PHARMACY CONSULT NOTE FOR:  OUTPATIENT  PARENTERAL ANTIBIOTIC THERAPY (OPAT)  Indication: pseudomonas necrotizing pneumonia Regimen: cefepime 2gm IV q8h End date: 05/17/2021  IV antibiotic discharge orders are pended. To discharging provider:  please sign these orders via discharge navigator,  Select New Orders & click on the button choice - Manage This Unsigned Work.     Thank you for allowing pharmacy to be a part of this patient's care.  Juliette Alcide, PharmD, BCPS.   Work Cell: [phone] 04/29/2021 4:38 PM

## 2021-04-29 NOTE — Consult Note (Addendum)
[facility]       Reason for Consult: necrotizing pneumonia    Referring Physician: Dr. Nelson Chimes  Principal Problem:   Acute on chronic respiratory failure with hypoxia [facility]) Active Problems:   COPD with acute exacerbation (HCC)   HCAP (healthcare-associated pneumonia)   Aspiration pneumonia (HCC)   Chronic pain   . buprenorphine-naloxone  1 tablet Sublingual Q8H  . Chlorhexidine Gluconate Cloth  6 each Topical Daily  . enoxaparin (LOVENOX) injection  40 mg Subcutaneous Q24H  . ipratropium-albuterol  3 mL Nebulization TID  . methylPREDNISolone (SOLU-MEDROL) injection  40 mg Intravenous Q12H  . metroNIDAZOLE  500 mg Oral Q8H  . sodium chloride flush  10-40 mL Intracatheter Q12H    Recommendations: Cefepime IV x 21 days I will stop metronidazole Follow up with pulmonary Will arrange follow up with Dr. Rivka Safer in about 3 weeks  Assessment: Necrotizing pneumonia with Pseudomonas. Recent treatment with levaquin. Will need a prolonged treatment course. Though CT suggests aspiration a possible cause, Pseudomonas positive and most c/w this.   Antibiotics: Cefepime day 4 Metronidazole day 4  HPI: Frank Calderon is a 70 y.o. male with COPD on chronic oxygen at home, chronic pain on suboxone who came in with SOB and CT of the chest c/w necrotic pneumonia in the left lower lobe with multiple fluid filled cavities.  He has a recent positive culture for pansensitive Pseudomonas and higher oxygen requirements at home. + leukocytosis. He feels better since admission.  He has been Afebrile. Previously treated with levaquin.     Review of Systems:  Constitutional: negative for fevers and chills Gastrointestinal: negative for nausea and diarrhea Integument/breast: negative for rash All other systems reviewed and are negative    Past Medical History:  Diagnosis Date  . Chronic pain   . COPD (chronic obstructive pulmonary disease) (HCC)     Social  History   Tobacco Use  . Smoking status: Former Games developer  . Smokeless tobacco: Never Used  Substance Use Topics  . Alcohol use: Not Currently  . Drug use: Never    Family History  Problem Relation Age of Onset  . Hypertension Mother     No Known Allergies  Physical Exam: Constitutional: in no apparent distress  Vitals:   04/29/21 0516 04/29/21 1422  BP: (!) 106/59 (!) 99/58  Pulse: 63 73  Resp: 16 18  Temp: 98.1 F (36.7 C) 97.8 F (36.6 C)  SpO2: 97% 97%   EYES: anicteric Cardiovascular: Cor RRR Respiratory: diffuse rhonchi Musculoskeletal: no pedal edema noted Skin: negatives: no rash Neuro: non-focal  Lab Results  Component Value Date   WBC 11.9 (H) 04/27/2021   HGB 9.6 (L) 04/27/2021   HCT 29.4 (L) 04/27/2021   MCV 85.7 04/27/2021   PLT 278 04/27/2021    Lab Results  Component Value Date   CREATININE 0.57 (L) 04/27/2021   BUN 19 04/27/2021   NA 134 (L) 04/27/2021   K 3.7 04/27/2021   CL 103 04/27/2021   CO2 26 04/27/2021    Lab Results  Component Value Date   ALT 32 04/17/2021   AST 23 04/17/2021   ALKPHOS 126 04/17/2021     Microbiology: Recent Results (from the past 240 hour(s))  Culture, blood (single)     Status: None (Preliminary result)   Collection Time: 04/26/21  1:54 PM   Specimen: BLOOD  Result Value Ref Range Status   Specimen Description BLOOD RIGHT ANTECUBITAL  Final   Special Requests  Final    BOTTLES DRAWN AEROBIC AND ANAEROBIC Blood Culture results may not be optimal due to an excessive volume of blood received in culture bottles   Culture   Final    NO GROWTH 3 DAYS Performed at [facility], [address] [place], [place] [address]    Report Status PENDING  Incomplete  Resp Panel by RT-PCR (Flu A&B, Covid) Nasopharyngeal Swab     Status: None   Collection Time: 04/26/21  4:08 PM   Specimen: Nasopharyngeal Swab; Nasopharyngeal(NP) swabs in vial transport medium  Result Value Ref Range Status   SARS  Coronavirus 2 by RT PCR NEGATIVE NEGATIVE Final    Comment: (NOTE) SARS-CoV-2 target nucleic acids are NOT DETECTED.  The SARS-CoV-2 RNA is generally detectable in upper respiratory specimens during the acute phase of infection. The lowest concentration of SARS-CoV-2 viral copies this assay can detect is 138 copies/mL. A negative result does not preclude SARS-Cov-2 infection and should not be used as the sole basis for treatment or other patient management decisions. A negative result may occur with  improper specimen collection/handling, submission of specimen other than nasopharyngeal swab, presence of viral mutation(s) within the areas targeted by this assay, and inadequate number of viral copies(<138 copies/mL). A negative result must be combined with clinical observations, patient history, and epidemiological information. The expected result is Negative.  Fact Sheet for Patients:  BloggerCourse.com  Fact Sheet for Healthcare Providers:  SeriousBroker.it  This test is no t yet approved or cleared by the [place] FDA and  has been authorized for detection and/or diagnosis of SARS-CoV-2 by FDA under an Emergency Use Authorization (EUA). This EUA will remain  in effect (meaning this test can be used) for the duration of the COVID-19 declaration under Section 564(b)(1) of the Act, 21 U.S.C.section 360bbb-3(b)(1), unless the authorization is terminated  or revoked sooner.       Influenza A by PCR NEGATIVE NEGATIVE Final   Influenza B by PCR NEGATIVE NEGATIVE Final    Comment: (NOTE) The Xpert Xpress SARS-CoV-2/FLU/RSV plus assay is intended as an aid in the diagnosis of influenza from Nasopharyngeal swab specimens and should not be used as a sole basis for treatment. Nasal washings and aspirates are unacceptable for Xpert Xpress SARS-CoV-2/FLU/RSV testing.  Fact Sheet for  Patients: BloggerCourse.com  Fact Sheet for Healthcare Providers: SeriousBroker.it  This test is not yet approved or cleared by the [place] FDA and has been authorized for detection and/or diagnosis of SARS-CoV-2 by FDA under an Emergency Use Authorization (EUA). This EUA will remain in effect (meaning this test can be used) for the duration of the COVID-19 declaration under Section 564(b)(1) of the Act, 21 U.S.C. section 360bbb-3(b)(1), unless the authorization is terminated or revoked.  Performed at [facility], [address] [place], [place] [address]   Culture, sputum-assessment     Status: None   Collection Time: 04/26/21  4:08 PM   Specimen: Nasopharyngeal Swab; Sputum  Result Value Ref Range Status   Specimen Description EXPECTORATED SPUTUM  Final   Special Requests NONE  Final   Sputum evaluation   Final    THIS SPECIMEN IS ACCEPTABLE FOR SPUTUM CULTURE Performed at [facility], [address], [place], [place] [address]    Report Status 04/27/2021 FINAL  Final  Culture, Respiratory w Gram Stain     Status: None   Collection Time: 04/26/21  4:08 PM  Result Value Ref Range Status   Specimen Description  Final    EXPECTORATED SPUTUM Performed at [facility], [address] [place], [place] [address]    Special Requests   Final    NONE Reflexed from [id number] Performed at [facility], [address] [place], [place] [address]    Gram Stain   Final    ABUNDANT WBC PRESENT,BOTH PMN AND MONONUCLEAR FEW GRAM POSITIVE COCCI    Culture   Final    RARE Consistent with normal respiratory flora. NO STAPHYLOCOCCUS AUREUS ISOLATED Performed at [facility], [address] [place], [place] [address]    Report Status 04/28/2021 FINAL  Final  MRSA PCR Screening     Status: None   Collection Time: 04/28/21 10:04 AM   Specimen: Nasopharyngeal  Result Value Ref Range  Status   MRSA by PCR NEGATIVE NEGATIVE Final    Comment:        The GeneXpert MRSA Assay (FDA approved for NASAL specimens only), is one component of a comprehensive MRSA colonization surveillance program. It is not intended to diagnose MRSA infection nor to guide or monitor treatment for MRSA infections. Performed at [facility] [address], [place], [place] [address]     Gardiner Barefoot, MD [facility] [email or web address] 04/29/2021, 5:30 PM

## 2021-04-29 NOTE — Care Management Important Message (Signed)
Important Message  Patient Details  Name: Frank Calderon MRN: [id number] Date of Birth: [birth year]   Medicare Important Message Given:  Yes     [name] 04/29/2021, 2:33 PM

## 2021-04-30 DIAGNOSIS — J9621 Acute and chronic respiratory failure with hypoxia: Secondary | ICD-10-CM | POA: Diagnosis not present

## 2021-04-30 MED ORDER — DM-GUAIFENESIN ER 30-600 MG PO TB12: 1.0000 | ORAL_TABLET | Freq: Two times a day (BID) | ORAL | 0 refills | Status: AC | PRN

## 2021-04-30 MED ORDER — CEFEPIME IV (FOR PTA / DISCHARGE USE ONLY): 2.0000 g | Freq: Three times a day (TID) | INTRAVENOUS | 0 refills | Status: AC

## 2021-04-30 MED ORDER — PREDNISONE 10 MG PO TABS: 10.0000 mg | ORAL_TABLET | Freq: Every day | ORAL | 0 refills | Status: AC

## 2021-04-30 NOTE — Plan of Care (Signed)

## 2021-04-30 NOTE — Discharge Summary (Signed)
Physician Discharge Summary  Frank Calderon KTG:[id number] DOB: [id number] DOA: 04/26/2021  PCP: [facility] [place] date: 04/26/2021 Discharge date: 04/30/2021  Admitted From: Home Disposition: Home  Recommendations for Outpatient Follow-up:  1. Follow up with PCP in 1-2 weeks 2. Follow-up with infectious disease in 2 to 3 weeks 3. Follow-up with pulmonology 4. Please obtain BMP/CBC in one week 5. Please follow up on the following pending results: None  Home Health: Yes Equipment/Devices: PICC line, on IV antibiotics Discharge Condition: Stable CODE STATUS: DNR Diet recommendation: Heart Healthy   Brief/Interim Summary: Frank Calderon a 70 y.o.malewith medical history significant ofCOPD on 2L O2,chronic painonSuboxone, who presents with SOB. Patient was recently hospitalized from 5/11to5/15 due to CAP and COPD exacerbation. Patient was discharged on Levaquin and 2 L oxygen.Sputum culture on 04/19/2021 was positive for Pseudomonas. Patient states that he has been having progressively worsening shortness in the past 5 days.He also has pleuritic chest pain, which is located in the left side of chest, constant, sharp, nonradiating, aggravated by deep breath and coughing. He has productive cough with yellow-colored mucus production. Denies fever or chills.   Hemodynamically stable.  CT chest with concern of necrotizing pneumonia and severe emphysema with bullous disease.  After discussing with Dr. Linus Salmons from ID he was started on cefepime and Flagyl for pseudomonal and anaerobic coverage. Pulmonary was also consulted today-patient will need long-term antibiotics with his current advanced underlying lung disease with necrotizing pneumonia.  Infectious disease and pulmonary both suggested total 3 weeks of IV cefepime, PICC line was placed and patient is being discharged on IV cefepime and will follow-up with infectious disease in 2 to 3 weeks.  Pulmonary  also suggested to involve palliative care to discuss goals of care as patient was full code, CODE STATUS changed to DNR and patient will follow-up with palliative care as an outpatient.  He is very high risk for deterioration and death.  Patient also complaining of some chest pain, most likely secondary to necrotizing pneumonia as no EKG changes and troponin remain negative.  Patient will continue rest of his home medications and follow-up with his providers.  Discharge Diagnoses:  Principal Problem:   Acute on chronic respiratory failure with hypoxia (HCC) Active Problems:   COPD with acute exacerbation ([place])   HCAP (healthcare-associated pneumonia)   Aspiration pneumonia ([place])   Chronic pain    Discharge Instructions  Discharge Instructions    [facility] pharmacist to adjust dose for Vancomycin, Aminoglycosides and other anti-infective therapies as requested by physician.   Complete by: As directed    [facility] to provide Cath Flo 52m   Complete by: As directed    Administer for PICC line occlusion and as ordered by physician for other access device issues.   Anaphylaxis Kit: Provided to treat any anaphylactic reaction to the medication being provided to the patient if First Dose or when requested by physician   Complete by: As directed    Epinephrine 137mml vial / amp: Administer 0.47m30m0.47ml70mubcutaneously once for moderate to severe anaphylaxis, nurse to call physician and pharmacy when reaction occurs and call 911 if needed for immediate care   Diphenhydramine 50mg48mIV vial: Administer 25-50mg 69mM PRN for first dose reaction, rash, itching, mild reaction, nurse to call physician and pharmacy when reaction occurs   Sodium Chloride 0.9% NS 500ml I647mdminister if needed for hypovolemic blood pressure drop or as ordered by physician after call to physician with anaphylactic reaction  Change dressing on IV access line weekly and PRN   Complete by: As  directed    Diet - low sodium heart healthy   Complete by: As directed    Discharge instructions   Complete by: As directed    It was pleasure taking care of you. You are being given IV antibiotics, please take it as directed, your home health services should be able to help you along with your family member. Please follow-up with infectious disease in 3 weeks.   Flush IV access with Sodium Chloride 0.9% and Heparin 10 units/ml or 100 units/ml   Complete by: As directed    Home infusion instructions - [facility]   Complete by: As directed    Instructions: Flush IV access with Sodium Chloride 0.9% and Heparin 10units/ml or 100units/ml   Change dressing on IV access line: Weekly and PRN   Instructions Cath Flo 58m: Administer for PICC Line occlusion and as ordered by physician for other access device   [facility] pharmacist to adjust dose for: Vancomycin, Aminoglycosides and other anti-infective therapies as requested by physician   Increase activity slowly   Complete by: As directed    Method of administration may be changed at the discretion of home infusion pharmacist based upon assessment of the patient and/or caregiver's ability to self-administer the medication ordered   Complete by: As directed      Allergies as of 04/30/2021   No Known Allergies     Medication List    STOP taking these medications   guaiFENesin 600 MG 12 hr tablet Commonly known as: MUCINEX   ibuprofen 400 MG tablet Commonly known as: ADVIL   levofloxacin 500 MG tablet Commonly known as: LEVAQUIN     TAKE these medications   albuterol 108 (90 Base) MCG/ACT inhaler Commonly known as: VENTOLIN HFA Inhale 2 puffs into the lungs every 6 (six) hours as needed.   aspirin 325 MG tablet Take 325 mg by mouth daily as needed.   buprenorphine-naloxone 8-2 mg Subl SL tablet Commonly known as: SUBOXONE Place 1 tablet under the tongue daily.   ceFEPime  IVPB Commonly known as:  MAXIPIME Inject 2 g into the vein every 8 (eight) hours for 17 days. Indication:  Pseudomonas necrotizing pneumonia First Dose: Yes Last Day of Therapy:  05/17/2021 Labs - Once weekly:  CBC/D and CMP Method of administration: IV Push Method of administration may be changed at the discretion of home infusion pharmacist based upon assessment of the patient and/or caregiver's ability to self-administer the medication ordered.   CENTRUM SILVER 50+MEN PO Take 1 tablet by mouth daily.   dextromethorphan-guaiFENesin 30-600 MG 12hr tablet Commonly known as: MUCINEX DM Take 1 tablet by mouth 2 (two) times daily as needed for cough.   fluticasone-salmeterol 250-50 MCG/ACT Aepb Commonly known as: ADVAIR 1 puff in the morning and at bedtime.   gabapentin 600 MG tablet Commonly known as: NEURONTIN Take 600 mg by mouth 3 (three) times daily.   ipratropium-albuterol 0.5-2.5 (3) MG/3ML Soln Commonly known as: DUONEB Inhale 3 mLs into the lungs every 6 (six) hours as needed.   predniSONE 10 MG tablet Commonly known as: DELTASONE Take 1 tablet (10 mg total) by mouth daily with breakfast. Take 2 tablets daily for next 2 days followed by 1 tablet daily for 3 days and then stop it What changed:   medication strength  how much to take  how to take this  when to take this  additional  instructions   Trelegy Ellipta 100-62.5-25 MCG/INH Aepb Generic drug: Fluticasone-Umeclidin-Vilant Inhale 1 puff into the lungs daily.            Discharge Care Instructions  (From admission, onward)         Start     Ordered   04/30/21 0000  Change dressing on IV access line weekly and PRN  (Home infusion instructions - [facility] )        04/30/21 [place], [employer]. Schedule an appointment as soon as possible for a visit.   Contact information: [place] [address] [phone]        Tsosie Billing,  MD. Schedule an appointment as soon as possible for a visit in 2 week(s).   Specialty: Infectious Diseases Contact information: [place] [address] [phone]              No Known Allergies  Consultations:  Infectious disease  Pulmonology  Procedures/Studies: DG Chest 2 View  Result Date: 04/26/2021 CLINICAL DATA:  Shortness of breath.  History of pneumonia. EXAM: CHEST - 2 VIEW COMPARISON:  Chest x-ray 04/17/2021 FINDINGS: The cardiac silhouette, mediastinal and hilar contours are within normal limits is stable. There is mild tortuosity and calcification of the thoracic aorta. Persistent extensive left lower lobe airspace process. Underlying emphysematous changes are noted but could not exclude cavitary process in the left lower lobe. Findings could suggest cavitary pneumonia or lung abscesses. Tumor would be another possibility. Recommend chest CT for further evaluation. The right lung remains relatively clear. IMPRESSION: Persistent extensive left lower lobe airspace process. Findings could suggest cavitary pneumonia or lung abscesses. Neoplastic process would be another possibility. Recommend chest CT with contrast for further evaluation. Electronically Signed   By: Marijo Sanes M.D.   On: 04/26/2021 12:26   CT Chest W Contrast  Result Date: 04/26/2021 CLINICAL DATA:  Pneumonia in a 70 year old male. EXAM: CT CHEST WITH CONTRAST TECHNIQUE: Multidetector CT imaging of the chest was performed during intravenous contrast administration. CONTRAST:  75m OMNIPAQUE IOHEXOL 300 MG/ML  SOLN COMPARISON:  Chest x-ray of Apr 26, 2021. FINDINGS: Cardiovascular: Heart size normal. No substantial pericardial effusion. Aortic valvular calcifications. Aortic atherosclerosis. Normal caliber central pulmonary vessels. Mediastinum/Nodes: Esophagus mildly patulous. RIGHT paratracheal lymph node measuring 1 cm. Scattered nodes elsewhere throughout the chest, largest is a  subcarinal lymph node measuring 1.5 cm short axis (image 80/2) no axillary lymphadenopathy. No supraclavicular adenopathy. Precarinal lymph node on image 65/2 measuring 1 cm. Mild enlargement of RIGHT hilar nodal tissue at 11 mm. Fullness of LEFT hilar nodal tissue as well, largest measuring approximately 11 mm (image 82/2) Lungs/Pleura: Severe pulmonary emphysema. Nodular opacities at the RIGHT lung base. Material within lower lobe bronchi bilaterally. Some ground-glass and septal thickening in the lingula. Dense basilar consolidation in the LEFT chest with multiple air-fluid levels involving the entire LEFT lower lobe aside from a portion of the superior segment. Trace LEFT-sided effusion. Upper Abdomen: Incidental imaging of upper abdominal contents without acute process. Musculoskeletal: No acute musculoskeletal finding. No destructive bone process. IMPRESSION: 1. Necrotic pneumonia likely due to aspiration in the LEFT lower lobe with multiple fluid filled cavities superimposed on a background of pulmonary emphysema. 2. Associated LEFT-sided pleural effusion is very small volume and without current pleural enhancement. The patient would be at risk for developing empyema, therefore would suggest attention on follow-up.  3. Pneumonitis at the RIGHT lung base as well without frank consolidative changes. 4. Nodal enlargement in the chest likely reactive, consider three-month follow-up to ensure resolution of above findings and assess lymph nodes. 5. Severe pulmonary emphysema. 6. Emphysema and aortic atherosclerosis. Aortic Atherosclerosis (ICD10-I70.0) and Emphysema (ICD10-J43.9). Electronically Signed   By: Zetta Bills M.D.   On: 04/26/2021 14:28   DG Chest Port 1 View  Result Date: 04/17/2021 CLINICAL DATA:  Shortness of breath, cough productive of green sputum, history COPD EXAM: PORTABLE CHEST 1 VIEW COMPARISON:  Portable exam 0948 hours compared to 04/10/2021 FINDINGS: Normal heart size, mediastinal  contours, and pulmonary vascularity. Atherosclerotic calcification aorta. Emphysematous and bronchitic changes consistent with COPD. New LEFT basilar consolidation consistent with pneumonia. Minimal chronic atelectasis at RIGHT base. No pleural effusion or pneumothorax. Bones demineralized. IMPRESSION: COPD changes with new LEFT basilar consolidation consistent with pneumonia . Electronically Signed   By: Lavonia Dana M.D.   On: 04/17/2021 10:16   DG Chest Portable 1 View  Result Date: 04/10/2021 CLINICAL DATA:  Shortness of breath. EXAM: PORTABLE CHEST 1 VIEW COMPARISON:  None. FINDINGS: The lungs are mildly hyperinflated. Moderate severity emphysematous lung disease is seen. Mild to moderate severity areas of atelectasis and/or early infiltrate are also seen within the bilateral lung bases. There is no evidence of a pleural effusion or pneumothorax. The heart size and mediastinal contours are within normal limits. Degenerative changes seen throughout the thoracic spine. IMPRESSION: COPD with mild to moderate severity bibasilar atelectasis and/or early infiltrate. Electronically Signed   By: Virgina Norfolk M.D.   On: 04/10/2021 23:27   [place] EKG SITE RITE  Result Date: 04/28/2021 If [facility] image not attached, placement could not be confirmed due to current cardiac rhythm.    Subjective: Patient was seen and examined today.  No new complaint.  Cough improving.  Ready to go home on IV antibiotics and waiting for educational session later today.  Discharge Exam: Vitals:   04/30/21 0803 04/30/21 0827  BP:  117/68  Pulse:  70  Resp:  16  Temp:  (!) 97.5 F (36.4 C)  SpO2: 97% 97%   Vitals:   04/30/21 0549 04/30/21 0603 04/30/21 0803 04/30/21 0827  BP: 109/66   117/68  Pulse: 71   70  Resp: 13   16  Temp: 98.4 F (36.9 C)   (!) 97.5 F (36.4 C)  TempSrc: Oral     SpO2: 95%  97% 97%  Weight:  58.6 kg    Height:        General: Pt is alert, awake, not in acute  distress Cardiovascular: RRR, S1/S2 +, no rubs, no gallops Respiratory: CTA bilaterally, no wheezing, no rhonchi Abdominal: Soft, NT, ND, bowel sounds + Extremities: no edema, no cyanosis   The results of significant diagnostics from this hospitalization (including imaging, microbiology, ancillary and laboratory) are listed below for reference.    Microbiology: Recent Results (from the past 240 hour(s))  Culture, blood (single)     Status: None (Preliminary result)   Collection Time: 04/26/21  1:54 PM   Specimen: BLOOD  Result Value Ref Range Status   Specimen Description BLOOD RIGHT ANTECUBITAL  Final   Special Requests   Final    BOTTLES DRAWN AEROBIC AND ANAEROBIC Blood Culture results may not be optimal due to an excessive volume of blood received in culture bottles   Culture   Final    NO GROWTH 4 DAYS Performed at [facility], [address] [place], [place] [address]    Report Status PENDING  Incomplete  Resp Panel by RT-PCR (Flu A&B, Covid) Nasopharyngeal Swab     Status: None   Collection Time: 04/26/21  4:08 PM   Specimen: Nasopharyngeal Swab; Nasopharyngeal(NP) swabs in vial transport medium  Result Value Ref Range Status   SARS Coronavirus 2 by RT PCR NEGATIVE NEGATIVE Final    Comment: (NOTE) SARS-CoV-2 target nucleic acids are NOT DETECTED.  The SARS-CoV-2 RNA is generally detectable in upper respiratory specimens during the acute phase of infection. The lowest concentration of SARS-CoV-2 viral copies this assay can detect is 138 copies/mL. A negative result does not preclude SARS-Cov-2 infection and should not be used as the sole basis for treatment or other patient management decisions. A negative result may occur with  improper specimen collection/handling, submission of specimen other than nasopharyngeal swab, presence of viral mutation(s) within the areas targeted by this assay, and inadequate number of viral copies(<138 copies/mL). A negative  result must be combined with clinical observations, patient history, and epidemiological information. The expected result is Negative.  Fact Sheet for Patients:  EntrepreneurPulse.com.au  Fact Sheet for Healthcare Providers:  IncredibleEmployment.be  This test is no t yet approved or cleared by the [place] FDA and  has been authorized for detection and/or diagnosis of SARS-CoV-2 by FDA under an Emergency Use Authorization (EUA). This EUA will remain  in effect (meaning this test can be used) for the duration of the COVID-19 declaration under Section 564(b)(1) of the Act, 21 U.S.C.section 360bbb-3(b)(1), unless the authorization is terminated  or revoked sooner.       Influenza A by PCR NEGATIVE NEGATIVE Final   Influenza B by PCR NEGATIVE NEGATIVE Final    Comment: (NOTE) The Xpert Xpress SARS-CoV-2/FLU/RSV plus assay is intended as an aid in the diagnosis of influenza from Nasopharyngeal swab specimens and should not be used as a sole basis for treatment. Nasal washings and aspirates are unacceptable for Xpert Xpress SARS-CoV-2/FLU/RSV testing.  Fact Sheet for Patients: EntrepreneurPulse.com.au  Fact Sheet for Healthcare Providers: IncredibleEmployment.be  This test is not yet approved or cleared by the [place] FDA and has been authorized for detection and/or diagnosis of SARS-CoV-2 by FDA under an Emergency Use Authorization (EUA). This EUA will remain in effect (meaning this test can be used) for the duration of the COVID-19 declaration under Section 564(b)(1) of the Act, 21 U.S.C. section 360bbb-3(b)(1), unless the authorization is terminated or revoked.  Performed at [facility], [address] [place], [place] [address]   Culture, sputum-assessment     Status: None   Collection Time: 04/26/21  4:08 PM   Specimen: Nasopharyngeal Swab; Sputum  Result Value Ref Range  Status   Specimen Description EXPECTORATED SPUTUM  Final   Special Requests NONE  Final   Sputum evaluation   Final    THIS SPECIMEN IS ACCEPTABLE FOR SPUTUM CULTURE Performed at [facility], [address] [place], [place] [address]    Report Status 04/27/2021 FINAL  Final  Culture, Respiratory w Gram Stain     Status: None   Collection Time: 04/26/21  4:08 PM  Result Value Ref Range Status   Specimen Description   Final    EXPECTORATED SPUTUM Performed at [facility], [address] [place], [place] [address]    Special Requests   Final    NONE Reflexed from [id number] Performed at [facility], [address], [place],  [address]  Gram Stain   Final    ABUNDANT WBC PRESENT,BOTH PMN AND MONONUCLEAR FEW GRAM POSITIVE COCCI    Culture   Final    RARE Consistent with normal respiratory flora. NO STAPHYLOCOCCUS AUREUS ISOLATED Performed at [facility], [place] [address] [place], [place] [address]    Report Status 04/28/2021 FINAL  Final  MRSA PCR Screening     Status: None   Collection Time: 04/28/21 10:04 AM   Specimen: Nasopharyngeal  Result Value Ref Range Status   MRSA by PCR NEGATIVE NEGATIVE Final    Comment:        The GeneXpert MRSA Assay (FDA approved for NASAL specimens only), is one component of a comprehensive MRSA colonization surveillance program. It is not intended to diagnose MRSA infection nor to guide or monitor treatment for MRSA infections. Performed at [facility], [address] [place], [place] [address]      Labs: BNP (last 3 results) Recent Labs    04/10/21 2320 04/17/21 0939  BNP 75.6 09.8   Basic Metabolic Panel: Recent Labs  Lab 04/26/21 1132 04/27/21 0436  NA 133* 134*  K 3.8 3.7  CL 96* 103  CO2 29 26  GLUCOSE 90 117*  BUN 22 19  CREATININE 0.60* 0.57*  CALCIUM 8.1* 7.6*   Liver Function Tests: No results for input(s): AST, ALT, ALKPHOS, BILITOT, PROT, ALBUMIN in  the last 168 hours. No results for input(s): LIPASE, AMYLASE in the last 168 hours. No results for input(s): AMMONIA in the last 168 hours. CBC: Recent Labs  Lab 04/26/21 1132 04/27/21 0436  WBC 13.7* 11.9*  NEUTROABS 11.8*  --   HGB 11.4* 9.6*  HCT 35.4* 29.4*  MCV 86.6 85.7  PLT 325 278   Cardiac Enzymes: No results for input(s): CKTOTAL, CKMB, CKMBINDEX, TROPONINI in the last 168 hours. BNP: Invalid input(s): POCBNP CBG: No results for input(s): GLUCAP in the last 168 hours. D-Dimer No results for input(s): DDIMER in the last 72 hours. Hgb A1c No results for input(s): HGBA1C in the last 72 hours. Lipid Profile No results for input(s): CHOL, HDL, LDLCALC, TRIG, CHOLHDL, LDLDIRECT in the last 72 hours. Thyroid function studies No results for input(s): TSH, T4TOTAL, T3FREE, THYROIDAB in the last 72 hours.  Invalid input(s): FREET3 Anemia work up No results for input(s): VITAMINB12, FOLATE, FERRITIN, TIBC, IRON, RETICCTPCT in the last 72 hours. Urinalysis No results found for: COLORURINE, APPEARANCEUR, [place], [place], [place], [place], [place], [place], PROTEINUR, UROBILINOGEN, NITRITE, LEUKOCYTESUR Sepsis Labs Invalid input(s): PROCALCITONIN,  WBC,  LACTICIDVEN Microbiology Recent Results (from the past 240 hour(s))  Culture, blood (single)     Status: None (Preliminary result)   Collection Time: 04/26/21  1:54 PM   Specimen: BLOOD  Result Value Ref Range Status   Specimen Description BLOOD RIGHT ANTECUBITAL  Final   Special Requests   Final    BOTTLES DRAWN AEROBIC AND ANAEROBIC Blood Culture results may not be optimal due to an excessive volume of blood received in culture bottles   Culture   Final    NO GROWTH 4 DAYS Performed at [facility], [address], [place], [place] [address]    Report Status PENDING  Incomplete  Resp Panel by RT-PCR (Flu A&B, Covid) Nasopharyngeal Swab     Status: None   Collection Time: 04/26/21  4:08 PM   Specimen:  Nasopharyngeal Swab; Nasopharyngeal(NP) swabs in vial transport medium  Result Value Ref Range Status   SARS Coronavirus 2 by RT PCR NEGATIVE  NEGATIVE Final    Comment: (NOTE) SARS-CoV-2 target nucleic acids are NOT DETECTED.  The SARS-CoV-2 RNA is generally detectable in upper respiratory specimens during the acute phase of infection. The lowest concentration of SARS-CoV-2 viral copies this assay can detect is 138 copies/mL. A negative result does not preclude SARS-Cov-2 infection and should not be used as the sole basis for treatment or other patient management decisions. A negative result may occur with  improper specimen collection/handling, submission of specimen other than nasopharyngeal swab, presence of viral mutation(s) within the areas targeted by this assay, and inadequate number of viral copies(<138 copies/mL). A negative result must be combined with clinical observations, patient history, and epidemiological information. The expected result is Negative.  Fact Sheet for Patients:  EntrepreneurPulse.com.au  Fact Sheet for Healthcare Providers:  IncredibleEmployment.be  This test is no t yet approved or cleared by the [place] FDA and  has been authorized for detection and/or diagnosis of SARS-CoV-2 by FDA under an Emergency Use Authorization (EUA). This EUA will remain  in effect (meaning this test can be used) for the duration of the COVID-19 declaration under Section 564(b)(1) of the Act, 21 U.S.C.section 360bbb-3(b)(1), unless the authorization is terminated  or revoked sooner.       Influenza A by PCR NEGATIVE NEGATIVE Final   Influenza B by PCR NEGATIVE NEGATIVE Final    Comment: (NOTE) The Xpert Xpress SARS-CoV-2/FLU/RSV plus assay is intended as an aid in the diagnosis of influenza from Nasopharyngeal swab specimens and should not be used as a sole basis for treatment. Nasal washings and aspirates are unacceptable for  Xpert Xpress SARS-CoV-2/FLU/RSV testing.  Fact Sheet for Patients: EntrepreneurPulse.com.au  Fact Sheet for Healthcare Providers: IncredibleEmployment.be  This test is not yet approved or cleared by the [place] FDA and has been authorized for detection and/or diagnosis of SARS-CoV-2 by FDA under an Emergency Use Authorization (EUA). This EUA will remain in effect (meaning this test can be used) for the duration of the COVID-19 declaration under Section 564(b)(1) of the Act, 21 U.S.C. section 360bbb-3(b)(1), unless the authorization is terminated or revoked.  Performed at [facility], [address] [place], [place] [address]   Culture, sputum-assessment     Status: None   Collection Time: 04/26/21  4:08 PM   Specimen: Nasopharyngeal Swab; Sputum  Result Value Ref Range Status   Specimen Description EXPECTORATED SPUTUM  Final   Special Requests NONE  Final   Sputum evaluation   Final    THIS SPECIMEN IS ACCEPTABLE FOR SPUTUM CULTURE Performed at [facility], [address] [place], [place] [address]    Report Status 04/27/2021 FINAL  Final  Culture, Respiratory w Gram Stain     Status: None   Collection Time: 04/26/21  4:08 PM  Result Value Ref Range Status   Specimen Description   Final    EXPECTORATED SPUTUM Performed at [facility], [address] [place], [place] [address]    Special Requests   Final    NONE Reflexed from [id number] Performed at [facility], [address] [place], [place] [address]    Gram Stain   Final    ABUNDANT WBC PRESENT,BOTH PMN AND MONONUCLEAR FEW GRAM POSITIVE COCCI    Culture   Final    RARE Consistent with normal respiratory flora. NO STAPHYLOCOCCUS AUREUS ISOLATED Performed at [facility], [place] [address], [place], [place] [address]    Report Status 04/28/2021 FINAL  Final  MRSA PCR  Screening     Status: None   Collection Time: 04/28/21 10:04  AM   Specimen: Nasopharyngeal  Result Value Ref Range Status   MRSA by PCR NEGATIVE NEGATIVE Final    Comment:        The GeneXpert MRSA Assay (FDA approved for NASAL specimens only), is one component of a comprehensive MRSA colonization surveillance program. It is not intended to diagnose MRSA infection nor to guide or monitor treatment for MRSA infections. Performed at [facility], [address] [place], [place] [address]     Time coordinating discharge: Over 30 minutes  SIGNED:  Lorella Nimrod, MD  [facility] 04/30/2021, 9:59 AM  If 7PM-7AM, please contact night-coverage [email or web address]  This record has been created using Systems analyst. Errors have been sought and corrected,but may not always be located. Such creation errors do not reflect on the standard of care.

## 2021-04-30 NOTE — TOC Progression Note (Signed)
Transition of Care [facility]) - Progression Note    Patient Details  Name: Frank Calderon MRN: [id number] Date of Birth: September 08, 1951  Transition of Care [facility]) CM/SW Contact  Gildardo Griffes, [place] Phone Number: 04/30/2021, 9:12 AM  Clinical Narrative:     CSW notes patient is active with [facility] nursing who will resume services at time of discharge.   Jeri Modena with [facility] to provide teaching this morning, plan for afternoon discharge following teaching and afternoon IV dosing.   No other discharge needs identified at this time.        Expected Discharge Plan and Services                                                 Social Determinants of Health (SDOH) Interventions    Readmission Risk Interventions No flowsheet data found.

## 2021-04-30 NOTE — Progress Notes (Signed)
Patient discharged per orders. Telle removed. PICC line remains in place, teaching completed by [facility] nurse. Discharged instructions reviewed. Patient down to vehicle by volunteer via wheelchair.

## 2021-05-01 LAB — CULTURE, BLOOD (SINGLE): Culture: NO GROWTH

## 2021-05-03 ENCOUNTER — Ambulatory Visit: Admit: 2021-05-03 | Discharge: 2021-05-11 | Disposition: A | Discharge status: Home / Self Care | Payer: MEDICARE

## 2021-05-03 ENCOUNTER — Encounter: Admit: 2021-05-03 | Discharge: 2021-05-11 | Disposition: A | Discharge status: Home / Self Care | Payer: MEDICARE

## 2021-05-07 ENCOUNTER — Telehealth: Payer: Self-pay

## 2021-05-07 NOTE — Telephone Encounter (Signed)
Patient family member said he went to [facility] and is on way home after admitted there. He said that he will have home health there that [facility] ordered and that they will be doing all his care through [facility]. I advised him to have the home health call me to verify who will be handling orders for [facility] med s if [facility] has taken over. I also alerted Dr. Luciana Axe of this news. Will await call form HH agency.

## 2021-05-10 MED ORDER — PIPERACILLIN-TAZOBACTAM 4.5 GRAM/100 ML DEXTROSE(ISO-OSM) IV PIGGYBACK
Freq: Three times a day (TID) | INTRAVENOUS | 0 refills | 22.00000 days | Status: CP
Start: 2021-05-10 — End: 2021-06-01

## 2021-05-11 MED ORDER — ALBUTEROL SULFATE 2.5 MG/3 ML (0.083 %) SOLUTION FOR NEBULIZATION
Freq: Four times a day (QID) | RESPIRATORY_TRACT | 2 refills | 20.00000 days | Status: CP | PRN
Start: 2021-05-11 — End: 2022-05-11
  Filled 2021-05-11: qty 180, 15d supply, fill #0

## 2021-05-11 MED ORDER — FLUTICASONE FUR. 100 MCG-UMECLID 62.5 MCG-VILANT 25 MCG INHALAT.POWDER
Freq: Every day | RESPIRATORY_TRACT | 0 refills | 60 days | Status: CP
Start: 2021-05-11 — End: ?

## 2021-05-11 MED ORDER — BUDESONIDE 0.5 MG/2 ML SUSPENSION FOR NEBULIZATION
Freq: Every day | RESPIRATORY_TRACT | 2 refills | 30.00000 days | Status: CP
Start: 2021-05-11 — End: ?

## 2021-05-11 MED ORDER — IPRATROPIUM BROMIDE 0.02 % SOLUTION FOR INHALATION
Freq: Four times a day (QID) | RESPIRATORY_TRACT | 2 refills | 30 days | Status: CP
Start: 2021-05-11 — End: ?

## 2021-05-11 MED ORDER — ARFORMOTEROL 15 MCG/2 ML SOLUTION FOR NEBULIZATION
Freq: Two times a day (BID) | RESPIRATORY_TRACT | 2 refills | 30.00000 days | Status: CP
Start: 2021-05-11 — End: ?

## 2021-05-11 MED FILL — TRELEGY ELLIPTA 100 MCG-62.5 MCG-25 MCG POWDER FOR INHALATION: RESPIRATORY_TRACT | 30 days supply | Qty: 60 | Fill #0

## 2021-05-16 ENCOUNTER — Ambulatory Visit
Admit: 2021-05-16 | Discharge: 2021-05-22 | Disposition: A | Discharge status: Home Health | Payer: MEDICARE | Admitting: Family Medicine

## 2021-05-16 ENCOUNTER — Ambulatory Visit: Admit: 2021-05-16 | Discharge: 2021-05-17 | Payer: MEDICARE

## 2021-05-16 ENCOUNTER — Encounter: Admit: 2021-05-16 | Discharge: 2021-05-17 | Payer: MEDICARE

## 2021-05-16 DIAGNOSIS — J449 Chronic obstructive pulmonary disease, unspecified: Principal | ICD-10-CM

## 2021-05-20 MED ORDER — CEFTAZIDIME 2 GRAM SOLUTION FOR INJECTION
Freq: Three times a day (TID) | INTRAMUSCULAR | 0 refills | 28 days | Status: CP
Start: 2021-05-20 — End: 2021-06-17

## 2021-05-22 MED ORDER — LIDOCAINE 5 % TOPICAL PATCH
MEDICATED_PATCH | Freq: Every day | TRANSDERMAL | 0 refills | 30 days | Status: CP
Start: 2021-05-22 — End: 2021-06-21

## 2021-05-22 MED ORDER — ACETAMINOPHEN 500 MG TABLET
Freq: Three times a day (TID) | ORAL | 0 refills | 0.00000 days
Start: 2021-05-22 — End: 2021-06-01

## 2021-05-23 DIAGNOSIS — J151 Pneumonia due to Pseudomonas: Principal | ICD-10-CM

## 2021-05-28 ENCOUNTER — Encounter: Admit: 2021-05-28 | Discharge: 2021-05-29 | Payer: MEDICARE

## 2021-06-07 ENCOUNTER — Encounter: Admit: 2021-06-07 | Discharge: 2021-06-07 | Payer: MEDICARE

## 2021-06-07 DIAGNOSIS — G5692 Unspecified mononeuropathy of left upper limb: Principal | ICD-10-CM

## 2021-06-07 DIAGNOSIS — M5412 Radiculopathy, cervical region: Principal | ICD-10-CM

## 2021-06-19 ENCOUNTER — Ambulatory Visit: Admit: 2021-06-19 | Discharge: 2021-06-20 | Payer: MEDICARE

## 2021-06-19 ENCOUNTER — Encounter: Admit: 2021-06-19 | Discharge: 2021-06-20 | Payer: MEDICARE

## 2021-06-19 DIAGNOSIS — J151 Pneumonia due to Pseudomonas: Principal | ICD-10-CM

## 2021-06-19 DIAGNOSIS — J449 Chronic obstructive pulmonary disease, unspecified: Principal | ICD-10-CM

## 2021-06-19 DIAGNOSIS — Z9189 Other specified personal risk factors, not elsewhere classified: Principal | ICD-10-CM

## 2021-06-19 DIAGNOSIS — R0781 Pleurodynia: Principal | ICD-10-CM

## 2021-06-19 DIAGNOSIS — Z792 Long term (current) use of antibiotics: Principal | ICD-10-CM

## 2021-06-21 ENCOUNTER — Encounter: Admit: 2021-06-21 | Discharge: 2021-06-22 | Payer: MEDICARE

## 2021-06-28 ENCOUNTER — Ambulatory Visit: Admit: 2021-06-28 | Discharge: 2021-07-04 | Disposition: A | Discharge status: Home / Self Care | Payer: MEDICARE

## 2021-07-04 MED ORDER — LIDOCAINE 5 % TOPICAL PATCH
MEDICATED_PATCH | Freq: Every day | TRANSDERMAL | 0 refills | 30 days | Status: CP
Start: 2021-07-04 — End: 2021-08-03

## 2021-07-04 MED ORDER — VENTOLIN HFA 90 MCG/ACTUATION AEROSOL INHALER
Freq: Four times a day (QID) | RESPIRATORY_TRACT | 3 refills | 0 days | Status: CP | PRN
Start: 2021-07-04 — End: 2022-07-04

## 2021-07-04 MED ORDER — FLUTICASONE FUR. 100 MCG-UMECLID 62.5 MCG-VILANT 25 MCG INHALAT.POWDER
Freq: Every day | RESPIRATORY_TRACT | 3 refills | 60 days | Status: CP
Start: 2021-07-04 — End: ?

## 2021-07-04 MED ORDER — ACETAMINOPHEN 325 MG TABLET
ORAL | 0 refills | 0 days | PRN
Start: 2021-07-04 — End: ?

## 2021-07-05 DIAGNOSIS — G8921 Chronic pain due to trauma: Principal | ICD-10-CM

## 2021-08-10 ENCOUNTER — Ambulatory Visit: Admit: 2021-08-10 | Discharge: 2021-08-11 | Payer: MEDICARE

## 2021-09-02 ENCOUNTER — Ambulatory Visit: Admit: 2021-09-02 | Discharge: 2021-09-12 | Disposition: A | Discharge status: Home / Self Care | Payer: MEDICARE

## 2021-09-02 ENCOUNTER — Ambulatory Visit: Admit: 2021-09-02 | Payer: MEDICARE

## 2021-09-02 ENCOUNTER — Ambulatory Visit: Admit: 2021-09-02 | Discharge: 2021-09-12 | Payer: MEDICARE

## 2021-09-14 MED ORDER — FERROUS SULFATE 325 MG (65 MG IRON) TABLET
ORAL_TABLET | ORAL | 11 refills | 30 days | Status: CP
Start: 2021-09-14 — End: 2022-09-14

## 2021-09-18 ENCOUNTER — Ambulatory Visit: Admit: 2021-09-18 | Discharge: 2021-09-19 | Disposition: A | Discharge status: Home / Self Care | Payer: MEDICARE

## 2021-09-24 ENCOUNTER — Ambulatory Visit: Admit: 2021-09-24 | Discharge: 2021-09-26 | Payer: MEDICARE

## 2021-09-24 MED ORDER — DOXYCYCLINE HYCLATE 100 MG CAPSULE
ORAL_CAPSULE | Freq: Two times a day (BID) | ORAL | 0 refills | 10.00000 days | Status: CP
Start: 2021-09-24 — End: 2021-10-04

## 2021-09-26 MED ORDER — ALBUTEROL SULFATE 2.5 MG/3 ML (0.083 %) SOLUTION FOR NEBULIZATION
Freq: Four times a day (QID) | RESPIRATORY_TRACT | 2 refills | 20.00000 days | Status: CP | PRN
Start: 2021-09-26 — End: ?

## 2021-09-27 ENCOUNTER — Ambulatory Visit
Admit: 2021-09-27 | Discharge: 2021-10-03 | Disposition: A | Discharge status: Home Health | Payer: MEDICARE | Admitting: Family Medicine

## 2021-09-27 ENCOUNTER — Ambulatory Visit: Admit: 2021-09-27 | Discharge: 2021-10-03 | Payer: MEDICARE

## 2021-10-03 MED ORDER — POLYETHYLENE GLYCOL 3350 17 GRAM/DOSE ORAL POWDER
Freq: Every day | ORAL | 0 refills | 30.00000 days | Status: CP
Start: 2021-10-03 — End: ?
  Filled 2021-10-03: qty 510, 30d supply, fill #0
  Filled 2021-10-03: qty 240, 15d supply, fill #0

## 2021-10-03 MED ORDER — SODIUM CHLORIDE 3 % FOR NEBULIZATION
Freq: Four times a day (QID) | RESPIRATORY_TRACT | 0 refills | 30.00000 days | Status: CP
Start: 2021-10-03 — End: ?

## 2021-10-03 MED ORDER — CIPROFLOXACIN 750 MG TABLET
ORAL_TABLET | Freq: Two times a day (BID) | ORAL | 0 refills | 9.00000 days | Status: CP
Start: 2021-10-03 — End: 2021-10-12
  Filled 2021-10-03: qty 17, 9d supply, fill #0

## 2021-10-03 MED ORDER — TOBRAMYCIN 300 MG/5 ML IN 0.225 % SODIUM CHLORIDE FOR NEBULIZATION
Freq: Two times a day (BID) | RESPIRATORY_TRACT | 0 refills | 29 days | Status: CP
Start: 2021-10-03 — End: 2021-10-03

## 2021-10-03 MED FILL — ANORO ELLIPTA 62.5 MCG-25 MCG/ACTUATION POWDER FOR INHALATION: RESPIRATORY_TRACT | 30 days supply | Qty: 60 | Fill #0

## 2021-10-04 MED ORDER — UMECLIDINIUM 62.5 MCG-VILANTEROL 25 MCG/ACTUATION POWDR FOR INHALATION
Freq: Every day | RESPIRATORY_TRACT | 11 refills | 30 days | Status: CP
Start: 2021-10-04 — End: ?

## 2021-10-24 ENCOUNTER — Ambulatory Visit: Admit: 2021-10-24 | Discharge: 2021-10-24 | Discharge status: Against Medical Advice | Payer: MEDICARE

## 2021-11-01 ENCOUNTER — Ambulatory Visit: Admit: 2021-11-01 | Discharge: 2021-11-05 | Payer: MEDICARE

## 2021-11-04 MED ORDER — SPIRIVA RESPIMAT 2.5 MCG/ACTUATION SOLUTION FOR INHALATION
Freq: Every day | RESPIRATORY_TRACT | 1 refills | 0 days | Status: CP
Start: 2021-11-04 — End: ?

## 2021-11-04 MED ORDER — ARFORMOTEROL 15 MCG/2 ML SOLUTION FOR NEBULIZATION
Freq: Two times a day (BID) | RESPIRATORY_TRACT | 1 refills | 30 days | Status: CP
Start: 2021-11-04 — End: 2021-12-04

## 2021-11-04 MED ORDER — PREDNISONE 20 MG TABLET
ORAL_TABLET | Freq: Every day | ORAL | 0 refills | 1 days | Status: CP
Start: 2021-11-04 — End: ?

## 2021-11-04 MED ORDER — BUDESONIDE 0.5 MG/2 ML SUSPENSION FOR NEBULIZATION
Freq: Two times a day (BID) | RESPIRATORY_TRACT | 1 refills | 30 days | Status: CP
Start: 2021-11-04 — End: 2022-11-04

## 2021-11-04 MED ORDER — ALBUTEROL SULFATE 2.5 MG/3 ML (0.083 %) SOLUTION FOR NEBULIZATION
RESPIRATORY_TRACT | 1 refills | 1.00000 days | Status: CP | PRN
Start: 2021-11-04 — End: 2022-11-04

## 2021-11-04 MED ORDER — AZITHROMYCIN 250 MG TABLET
ORAL_TABLET | Freq: Every day | ORAL | 1 refills | 30 days | Status: CP
Start: 2021-11-04 — End: ?

## 2021-11-05 MED ORDER — LEVOFLOXACIN 750 MG TABLET
ORAL_TABLET | Freq: Every day | ORAL | 0 refills | 2 days | Status: CP
Start: 2021-11-05 — End: ?

## 2021-11-17 ENCOUNTER — Ambulatory Visit: Admit: 2021-11-17 | Discharge: 2021-11-22 | Payer: MEDICARE

## 2021-11-17 ENCOUNTER — Ambulatory Visit: Admit: 2021-11-17 | Discharge: 2021-11-22 | Disposition: A | Discharge status: Home / Self Care | Payer: MEDICARE

## 2021-11-21 MED ORDER — PREDNISONE 20 MG TABLET
ORAL_TABLET | Freq: Every day | ORAL | 0 refills | 1 days | Status: CP
Start: 2021-11-21 — End: 2021-11-22

## 2021-12-14 IMAGING — CR DG CHEST 2V
2 series · 2 of 2 positions shown · non-contrast
Comparison: Chest x-ray 04/17/2021

CLINICAL DATA: Shortness of breath.  History of pneumonia.

EXAM:
CHEST - 2 VIEW

[chest lat]
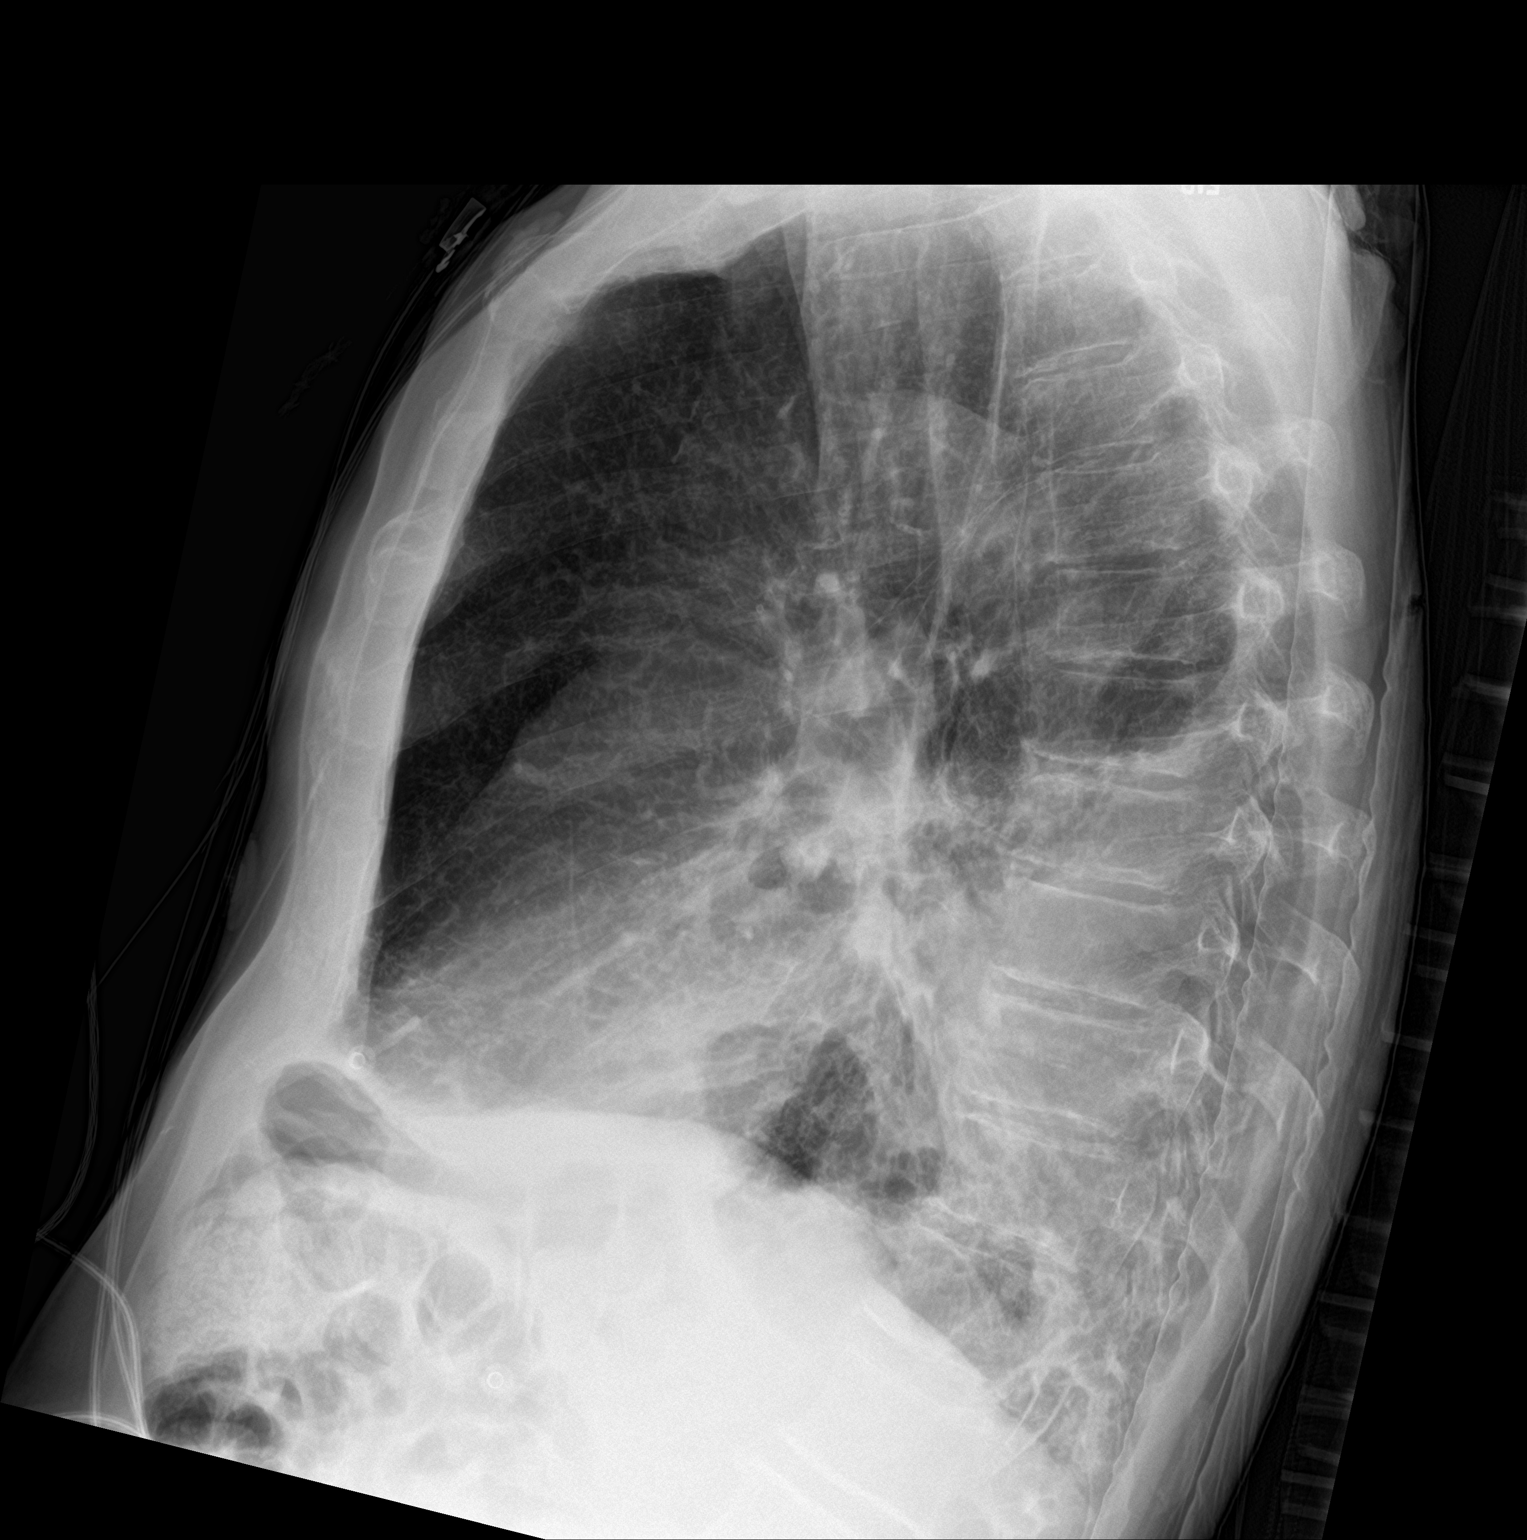

[chest ap]
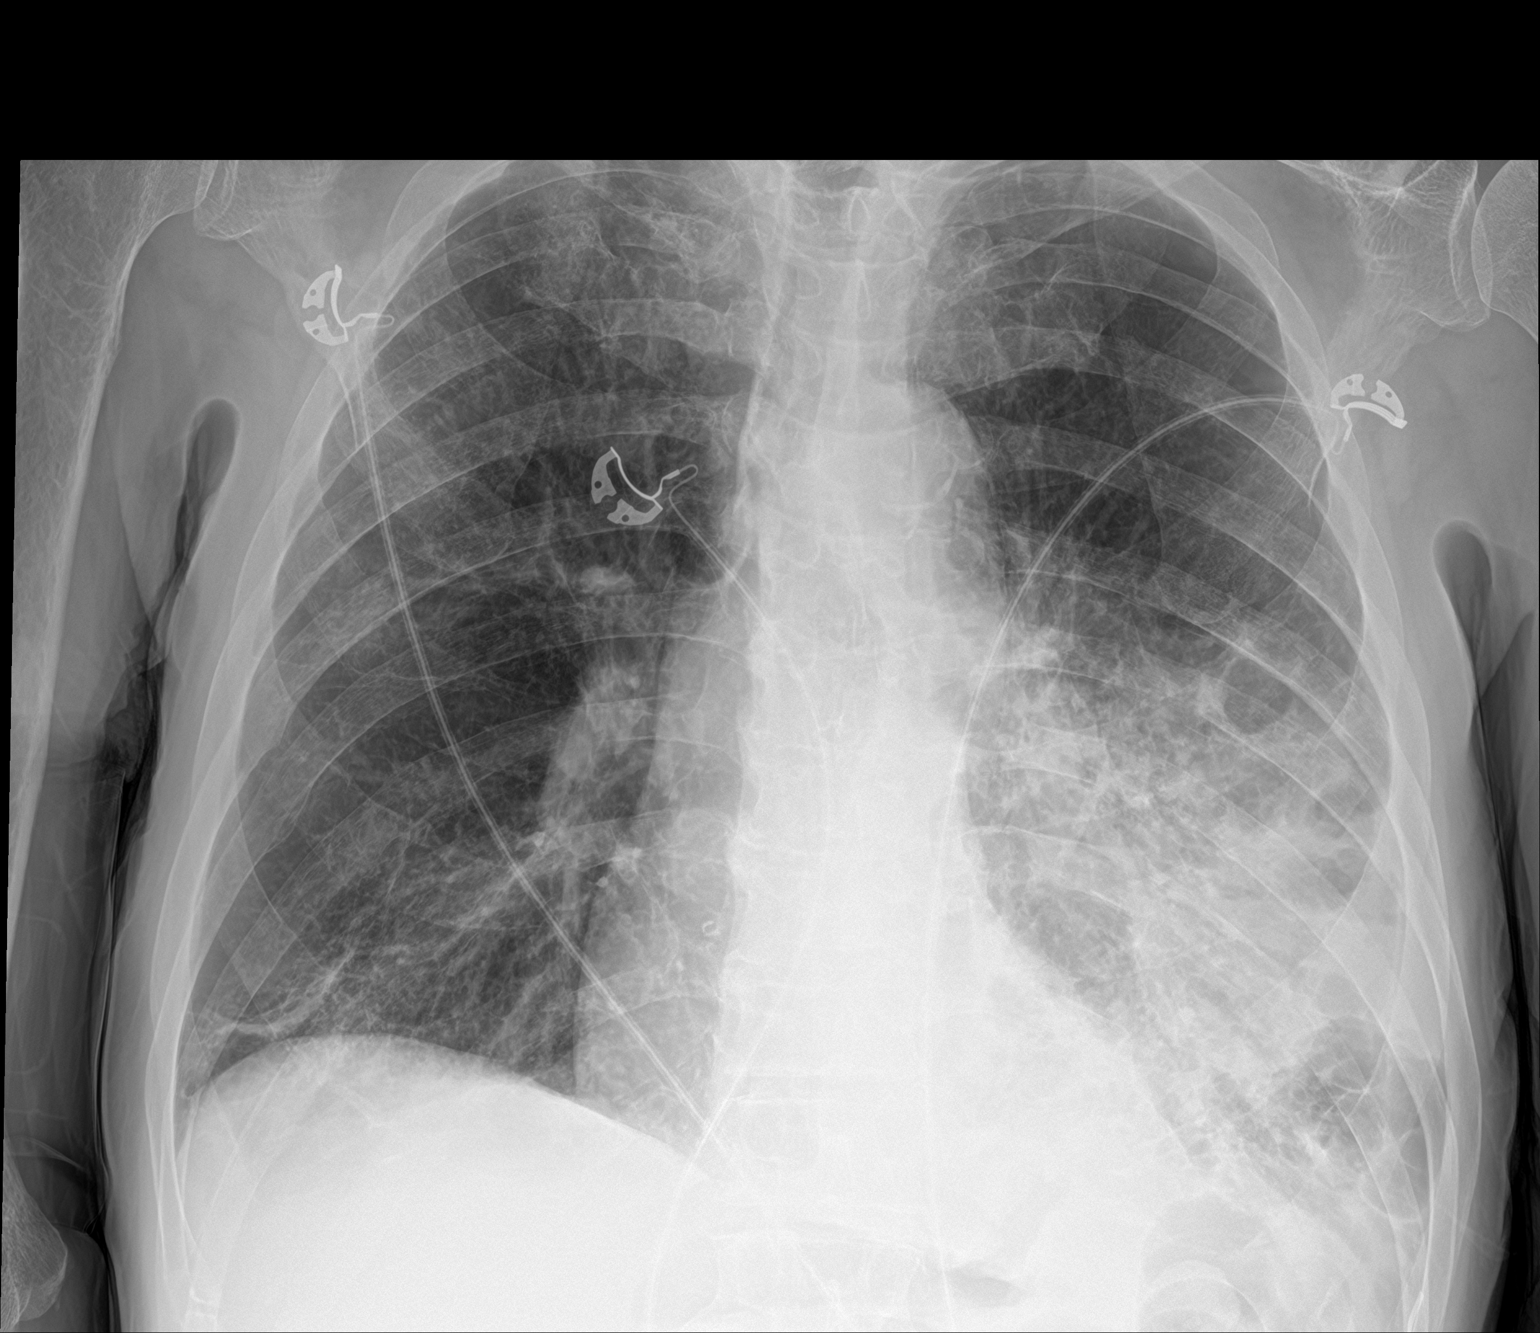

[2 of 2 positions shown; findings below may reference images not displayed]

FINDINGS: The cardiac silhouette, mediastinal and hilar contours are within
normal limits is stable. There is mild tortuosity and calcification
of the thoracic aorta.

Persistent extensive left lower lobe airspace process. Underlying
emphysematous changes are noted but could not exclude cavitary
process in the left lower lobe. Findings could suggest cavitary
pneumonia or lung abscesses. Tumor would be another possibility.
Recommend chest CT for further evaluation.

The right lung remains relatively clear.
IMPRESSION: Persistent extensive left lower lobe airspace process. Findings
could suggest cavitary pneumonia or lung abscesses. Neoplastic
process would be another possibility. Recommend chest CT with
contrast for further evaluation.

## 2022-01-05 ENCOUNTER — Ambulatory Visit
Admit: 2022-01-05 | Discharge: 2022-01-09 | Disposition: A | Discharge status: Home / Self Care | Payer: MEDICARE | Admitting: Student in an Organized Health Care Education/Training Program

## 2022-01-05 ENCOUNTER — Ambulatory Visit: Admit: 2022-01-05 | Discharge: 2022-01-09 | Payer: MEDICARE

## 2022-01-08 MED ORDER — YUPELRI 175 MCG/3 ML SOLUTION FOR NEBULIZATION
Freq: Once | RESPIRATORY_TRACT | 0 refills | 0 days | Status: CP
Start: 2022-01-08 — End: 2022-01-08

## 2022-01-09 MED ORDER — IPRATROPIUM 0.5 MG-ALBUTEROL 3 MG (2.5 MG BASE)/3 ML NEBULIZATION SOLN
Freq: Four times a day (QID) | RESPIRATORY_TRACT | 1 refills | 30 days | Status: CP
Start: 2022-01-09 — End: 2022-02-08

## 2022-01-09 MED ORDER — BUDESONIDE 0.5 MG/2 ML SUSPENSION FOR NEBULIZATION
Freq: Two times a day (BID) | RESPIRATORY_TRACT | 11 refills | 30 days | Status: CP
Start: 2022-01-09 — End: ?

## 2022-01-10 MED ORDER — AMOXICILLIN 875 MG-POTASSIUM CLAVULANATE 125 MG TABLET
ORAL_TABLET | Freq: Two times a day (BID) | ORAL | 0 refills | 14 days | Status: CP
Start: 2022-01-10 — End: 2022-01-24

## 2022-01-22 DIAGNOSIS — J441 Chronic obstructive pulmonary disease with (acute) exacerbation: Principal | ICD-10-CM

## 2022-02-10 ENCOUNTER — Ambulatory Visit: Admit: 2022-02-10 | Discharge: 2022-02-13 | Disposition: A | Discharge status: Home / Self Care | Payer: MEDICARE

## 2022-02-13 MED ORDER — ALBUTEROL SULFATE HFA 90 MCG/ACTUATION AEROSOL INHALER
RESPIRATORY_TRACT | 12 refills | 0 days | Status: CP | PRN
Start: 2022-02-13 — End: 2023-02-13
  Filled 2022-02-18: qty 8.5, 30d supply, fill #0

## 2022-02-13 MED ORDER — STIOLTO RESPIMAT 2.5 MCG-2.5 MCG/ACTUATION SOLUTION FOR INHALATION
Freq: Every day | RESPIRATORY_TRACT | 12 refills | 14 days | Status: CP
Start: 2022-02-13 — End: ?
  Filled 2022-02-18: qty 4, 30d supply, fill #0

## 2022-05-07 ENCOUNTER — Ambulatory Visit: Admit: 2022-05-07 | Payer: MEDICARE

## 2022-05-07 ENCOUNTER — Ambulatory Visit: Admit: 2022-05-07 | Discharge: 2022-06-06 | Payer: MEDICARE

## 2022-05-07 ENCOUNTER — Ambulatory Visit: Admit: 2022-05-07 | Discharge: 2022-06-06 | Disposition: A | Discharge status: Home Health | Payer: MEDICARE

## 2022-06-06 MED ORDER — ALBUTEROL SULFATE 2.5 MG/3 ML (0.083 %) SOLUTION FOR NEBULIZATION
RESPIRATORY_TRACT | 0 refills | 5 days | Status: CP | PRN
Start: 2022-06-06 — End: ?

## 2022-06-06 MED ORDER — BUPRENORPHINE 8 MG-NALOXONE 2 MG SUBLINGUAL TABLET
ORAL_TABLET | Freq: Three times a day (TID) | SUBLINGUAL | 0 refills | 5 days | Status: CP
Start: 2022-06-06 — End: 2022-06-11
  Filled 2022-06-06: qty 15, 5d supply, fill #0

## 2022-06-06 MED ORDER — IPRATROPIUM BROMIDE 0.02 % SOLUTION FOR INHALATION
Freq: Four times a day (QID) | RESPIRATORY_TRACT | 0 refills | 30 days | Status: CP
Start: 2022-06-06 — End: 2022-06-06

## 2022-06-06 MED ORDER — SODIUM CHLORIDE 10 % FOR NEBULIZATION
Freq: Two times a day (BID) | RESPIRATORY_TRACT | 0 refills | 30 days | Status: CP
Start: 2022-06-06 — End: ?
  Filled 2022-06-30: qty 300, 10d supply, fill #0

## 2022-06-06 MED ORDER — AEROBIKA OSCILLATING PEP SYSTEM DEVICE
Freq: Two times a day (BID) | 0 refills | 0 days | Status: CP
Start: 2022-06-06 — End: ?

## 2022-06-06 MED ORDER — AZITHROMYCIN 250 MG TABLET
ORAL_TABLET | Freq: Every day | ORAL | 3 refills | 90 days | Status: CP
Start: 2022-06-06 — End: ?
  Filled 2022-06-06: qty 60, 60d supply, fill #0

## 2022-06-06 MED ORDER — FAMOTIDINE 20 MG TABLET
ORAL_TABLET | Freq: Two times a day (BID) | ORAL | 0 refills | 30 days | Status: CP
Start: 2022-06-06 — End: ?
  Filled 2022-06-06: qty 60, 30d supply, fill #0

## 2022-06-06 MED ORDER — ARFORMOTEROL 15 MCG/2 ML SOLUTION FOR NEBULIZATION
Freq: Two times a day (BID) | RESPIRATORY_TRACT | 0 refills | 30.00000 days | Status: CP
Start: 2022-06-06 — End: 2022-06-06

## 2022-06-12 ENCOUNTER — Ambulatory Visit: Admit: 2022-06-12 | Payer: MEDICARE

## 2022-06-12 ENCOUNTER — Ambulatory Visit
Admit: 2022-06-12 | Discharge: 2022-06-20 | Disposition: A | Discharge status: Skilled Nursing Facility | Payer: MEDICARE | Admitting: Student in an Organized Health Care Education/Training Program

## 2022-06-12 NOTE — Unmapped (Addendum)
Ashely Joshua is a 71 y.o. male whose presentation is complicated by bronchiectasis on 2 L O2, tobacco use disorder, opioid use disorder, and multidrug resistant PsA PNA who presented to [facility] with dyspnea, concerning for bronchiectasis with acute exacerbation, improved on discharge. See hospital course by problem list as below.      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 CXR without clear change in infiltrate. Started on levaquin based on hx of MDR and achrombacter sensitive to pseudomonas. Started on prednisone x 5 days for COPD exacerbation. ID and pulm c/s and treated for bronchiectasis flare for total of 10 days with levaquin. Pulmonology follow-up arranged. He will also be started on tobramycin for Pseudomonal prophylaxis given frequent hospitalizations, per ID recs. He was given Prevnar-20 vaccine and will discharge to SNF with oxygen requirement at baseline. Ongoing GOC discussions with patient and family given end-stage lung disease, to be scheduled for outpatient follow-up with home based palliative care.     Chronic problems  Opioid use disorder: Continued 8mg  tablet suboxone TID  GERD: Continued famotidine BID  Iron deficiency anemia: Continued daily iron

## 2022-06-18 NOTE — Unmapped (Signed)
Pulmonary Consult Service  Follow-Up Note      Primary Service:   Medicine - General Medicine L  Primary Service Attending:  Judd Lien, MD  Reason for Consult:   Bronchiectasis Exacerbation    ASSESSMENT and PLAN             Pulmonary Consult Service  Follow-Up Note        Primary Service:                                Medicine - General Medicine L  Primary Service Attending:              Judd Lien, MD  Reason for Consult:                          Bronchiectasis Exacerbation     ASSESSMENT and PLAN      Kristopher Obrien is a 71 y.o. male with a history of GOLD stage E COPD, bronchiectasis complicated by multiple prior exacerbations with prior cultures growing MDR Pseudomonas, and prior pulmonary hypertension by echocardiography (2021) who presents with recurrent dyspnea shortly after discharge from [facility] for the same.  Pulmonary is consulted for further recommendations.     Patient has a 50PPY+ smoking history and reports to have quit 2-3 years ago. Goals of care discussion took place previously this weekend with the pulmonary consult team. His family will be at the bedside this afternoon for further discussion. Patient reports feeling and functioning normally just a few months ago. Unclear of patient's baseline functioning status at this point. Patient reports never receiving any information regarding being considered for lung transplant in the past. Could consider evaluation for lung transplant candidacy outpatient.      WBC increased at 14.9 this morning, from 13.9 yesterday. BMP stable. He is sat'ing appropriately on 4L [place], up from 3L [place] yesterday. He remains on nebulized breathing treatments and inhaled corticosteroids. Clearing significant amounts of sputum daily as expected. He denies any symptoms of fever, chills, worsening cough, or other pneumonia symptoms. Denies any new urinary symptoms. He is overall looking better. We are signing off at this time. Prior to discharge, please confirm patient has access to nebulizer machine at home. Additionally, please advise patient to continue same aggressive airway clearance regimen he received inpatient while at home. He is to continue airway clearance with Duonebs, hypertonic saline, and Aerobika 2 times daily, instead of 4 times daily.     #Bronchiectasis with acute exacerbation  - LRCx grew GN rods, with only achromobacter speciating as of 06/17/22. Patient receiving appropriate coverage with 750 Levaquin.  - Continue antibiotic course. Today is day 7. Recommend 10 day course.   - Continue nasal steroids for upper airway congestion  - Continue inhaled corticosteroid for lower airway congestion  - Continue airway clearance with Duonebs, hypertonic saline, and Aerobika 4 times daily inpatient. Advise patient to continue aggressive airway clearance regimen QID while at home.   - Please discharge on azithromycin 250 mg daily after levofloxacin course is completed       #GOLD E COPD  - Continue nebulized ipratropium/albuterol + budesonide 0.5 mg bid on discharge     #Goals of care  - Palliative care was able to see patient yesterday at the bedside.  - GOC and expectations regarding recovery and rehabilitation following hospital admission established.  [name] was seen, examined and discussed with  Dr. Ignacia Marvel   Thank you for involving [place] in his care. We will sign off at this time.  Please don't hesitate to page [place] with any questions or concerns at [phone] (pulmonary consult fellow).    Precious C. Ichite, MD  Anesthesiology, PGY-1  Pager: [phone]      Elysse Polidore E. Talmadge Coventry, MD  Pulmonary & Critical Care Fellow  Pager: [phone]        SUBJECTIVE:     Patient is stable overnight. This morning, patient reports not getting very good sleep overnight due to intermittent SOB. He was previously on 3L [place], he was bumped up to 4L [place] with resolution of symptoms. Much less talkative this morning due to drowsiness. He has been compliant with airway clearance regimen and continues to cough up about a tablespoon of dark green sputum an hour. Sputum this morning contained few streaks of blood. Patient overall is looking and sounding better. Denies any new or worsening pulmonary or urinary symptoms.    Medications:     Scheduled Meds:   arformoteroL  15 mcg Nebulization BID (RT)    budesonide  0.5 mg Nebulization Daily (RT)    buprenorphine-naloxone  4 tablet Sublingual TID    enoxaparin (LOVENOX) injection  40 mg Subcutaneous Q24H    famotidine  20 mg Oral BID    ferrous sulfate  325 mg Oral Every other day    gabapentin  900 mg Oral TID    ipratropium-albuteroL  6 mL Nebulization TID (RT)    levoFLOXacin  750 mg Oral Q24H SCH    sodium chloride 7%  4 mL Nebulization TID (RT)     Continuous Infusions:  PRN Meds:.     PHYSICAL EXAM:   BP 101/62  - Pulse 88  - Temp 37 ??C (98.6 ??F) (Oral)  - Resp 18  - SpO2 97%   General: Alert, well-appearing, and in no distress.  Eyes: Anicteric sclera, conjunctiva clear.  ENT:  Nares normal, septum midline, mucosa normal, no drainage or sinus tenderness.  Lymph: No cervical or supraclavicular adenopathy.  Lungs: Mild diffuse expiratory wheezing. Wheezing may be masked by diffuse coarse crackles bilaterally. Course breath sound worse in the lung bases. Significant improvement upon auscultation after coughing up sputum. Breathing unlabored.  Cardiovascular: Regular rate and rhythm, S1, S2 normal, no murmur, click, rub or gallop appreciated.  Abdomen: Soft, non-tender, not distended, bowel sounds are normal, liver is not enlarged, spleen is not enlarged  Musculoskeletal: No clubbing and no synovitis.  Skin: No rashes or lesions.  Neuro: No focal neurological deficits.        LABORATORY and RADIOLOGY DATA:     Pertinent Laboratory Data from the last 24 hours:  Lab Results   Component Value Date    WBC 14.9 (H) 06/18/2022    HGB 10.3 (L) 06/18/2022    HCT 32.0 (L) 06/18/2022    PLT 248 06/18/2022     Lab Results Component Value Date    NA 138 06/18/2022    K 4.3 06/18/2022    CL 99 06/18/2022    CO2 35.0 (H) 06/18/2022    BUN 15 06/18/2022    CREATININE 0.72 06/18/2022    GLU 90 06/18/2022    CALCIUM 8.8 06/18/2022    MG 2.4 06/11/2022    PHOS 2.5 05/07/2022       Lab Results   Component Value Date    BILITOT 0.7 06/11/2022  BILIDIR <0.10 11/03/2021    PROT 7.5 06/11/2022    ALBUMIN 3.1 (L) 06/11/2022    ALT 7 (L) 06/11/2022    AST 18 06/12/2022    ALKPHOS 71 06/11/2022       Lab Results   Component Value Date    INR 1.20 05/16/2021    APTT 31.3 05/05/2021       Pertinent Micro Data:  Microbiology Results (last day)       Procedure Component Value Date/Time Date/Time    Lower Respiratory Culture [[id number]]  (Abnormal) Collected: 06/15/22 1044    Lab Status: Preliminary result Specimen: SPUTUM EXPECTORATED Updated: 06/17/22 1426     Lower Respiratory Culture 2+ Achromobacter species     Comment: Susceptibility Testing By Consultation Only         3+ Oropharyngeal Flora Isolated     Gram Stain 10-25 PMNS/LPF      <10  Epithelial cells/LPF      1+ Gram negative rods (bacilli)      Acceptable for culture    Narrative:      Specimen Source: SPUTUM EXPECTORATED    Aerobic Susceptibility-[place] [[id number]] Collected: 06/13/22 1016    Lab Status: In process Specimen: SPUTUM INDUCED Updated: 06/17/22 1308

## 2022-06-19 MED ORDER — TOBRAMYCIN 300 MG/4 ML SOLUTION FOR NEBULIZATION
Freq: Two times a day (BID) | ORAL | 0 refills | 0.00000 days
Start: 2022-06-19 — End: 2022-06-19

## 2022-06-19 NOTE — Unmapped (Signed)
Division of Infectious Diseases  General Inpatient Consultation Service       For any questions about this consult, page [phone] (Gen A Follow-up Pager).      Kristopher Obrien is being seen in consultation at the request of Judd Lien, MD for evaluation and management of bronchiectasis exacerbation.       PLAN FOR 06/19/2022    Diagnostic  If patient remains inpatient, consider inpatient dental consult due to poor dentition. If not, recommend outpatient dental follow-up.   Monitor for antimicrobial toxicity with the following:  CBC w/diff at least TWICE per week  BMP at least once per week  clinical assessments for rashes or other skin changes  ECG at baseline and after 3 days of dosing to assess QTc changes    Treatment  Recommend PCV20 vaccine  Recommend against double coverage of Pseudomonas given clinical stability   There is no role for inhaled antibiotics during acute exacerbations of bronchiectasis, but may have a role in prophylaxis after discharge given frequent hospitalizations. (see problem list below). Consider inhaled tobramycin in 28 day cycles once acute exacerbation is over. Please note that while this would suppress Pseudomonas, it will not suppress Achromobacter, which is intrinsically resistant to aminoglycosides.   Continue airway clearance therapies  Continue levofloxacin 750mg  qdaily, given laboratory and clinical improvement would defer escalation or switching therapies at this time.   Duration of therapy = 10 days   start date = 7/6  end date = 7/16    I discussed the plans for today with primary team on 06/19/2022.    Our service will sign off.    I have verified all student documentation or findings. I have personally performed or re-performed the physical exam and medical decision making. Note cowritten with Heide Spark, MS4.     Sarita Haver, MD [facility]  [place] Division of Infectious Diseases                MDM and Problem-Specific Assessments  ( .00ID2DAY  / .[id number]  /  .IDSS  / .Nancee Liter )     Kristopher Obrien is a 71 y.o. male with a past medical history significant of bronchiectasis (on 2L O2), poorly controlled COPD, previous tobacco use disorder, chronic pain (on suboxone), history of multi-drug resistant PsA pneumonia, and pulmonary hypertension who presented to the ED on 7/5 due to SOB in the setting of a bronchiectasis exacerbation.    June 19, 2022   Patient has: [x]  acute illness w/systemic sxs  [mod] []  illness posing risk to life or function  [high]   I reviewed:   (3+) [x]  primary team note []  consultant note(s) []  procedure/op note(s) [x]  micro result(s)    [x]  CBC results [x]  chemistry results []  radiology report(s) []  nursing note(s)   I independently visualized:   (any)   []  cxs/plates in lab []  plain film images []  CT images []  PET images    []  path slide(s) []  ECG tracing []  MRI images []  nuclear scan   I discussed: (any) []  micro and/or path w/lab personnel [x]  drug options and/or interactions w/ID pharmD    []  procedure/OR findings w/other MD(s) []  echo and/or imaging w/other MD(s)    []  mgm't w/attending(s) involved in case []  setting up home abx w/OPAT team   Mgm't requires: [x]  prescription drug(s)  [mod] [x]  intensive toxicity monitoring  [high]     Interval notes & result reports show: Afebrile, slightly tachycardic at 120.    Patient  reports: Feeling overall the same. No headache, vision changes, ear pain/hearing changes, runny/stuffy nose, sore throat. No new rashes, no pressure sores, no chest pain, no diarrhea, no dysuria. No new joint pain. Same level of shortness of breath.     My interpretation of the data I reviewed is: WBC 17.1 increased from 14.9 yesterday. Cr normal at 0.72. 7/7 and 7/9 lower respiratory cx with achromobacter species.    #Bronchiectasis Exacerbation- acute, poses threat to life or bodily function  [high]  Increased symptoms of cough, sputum production, fatigue, and increased oxygen requirements from baseline meeting criteria for an acute exacerbation based on evaluation on 06/12/22, with no concern for a secondary PNA based on CXR. Given his frequent hospitalizations, this patient could benefit from aggressive airway clearance techniques (acapella, chest physiotherapy) as well as outpatient prophylactic 28 day cycles of inhaled tobramycin. Please note that inhaled antibiotics are not recommended for therapy in acute exacerbations. Given clinical stability, would not provide patient with double coverage at this time. Based off of prior Pseudomonas and Achromobacter isolates, levofloxacin is an appropriate choice.   Patient remains clinically stable since he was last evaluated by ID, with no new changes in his status with a mildly increased WBC. No indication to change or add treatment.  Antibiotics per blue box above.       # Management of prescription antimicrobials needing intensive toxicity monitoring - acute, poses threat to life or bodily function  [high]  Quinolones can cause rashes, N/V/D, CNS effects, MSK effects, arrhythmias and/or QTc prolongation, glucose abnormalities, and/or neuropathies.   See recommendations in blue box above.      # Disposition  No indication for long term IV antibiotics at this time.             Antimicrobials & Other Medications     Current  Levofloxacin 7/6 -       Previous  Has previously been on levaquin, primaxin, tobramycin, ceftazidime, and azithromycin.      Immunomodulators and antipyretics  Prednisone 40mg  daily 7/6-7/10      Current Medications as of 06/19/2022  Scheduled  PRN   arformoteroL, 15 mcg, BID (RT)  budesonide, 0.5 mg, Daily (RT)  buprenorphine-naloxone, 4 tablet, TID  enoxaparin (LOVENOX) injection, 40 mg, Q24H  famotidine, 20 mg, BID  ferrous sulfate, 325 mg, Every other day  gabapentin, 900 mg, TID  ipratropium-albuteroL, 6 mL, TID (RT)  levoFLOXacin, 750 mg, Q24H SCH  sodium chloride 7%, 4 mL, TID (RT)                Physical Exam     Temp:  [37.1 ??C (98.8 ??F)] 37.1 ??C (98.8 ??F)  Heart Rate:  [115-120] 120  Resp:  [20-22] 20  BP: (92)/(58) 92/58  MAP (mmHg):  [71] 71  SpO2:  [94 %-95 %] 95 %    Actual body weight:    Ideal body weight: 73 kg (160 lb 15 oz)      Const [x]  vital signs above      [x]  WDWN, NAD, non-toxic appearance on 3L [place]  []        Eyes    [x]  Lids normal bilaterally, conjunctiva anicteric and noninjected OU  []  PERRL   []        ENMT     [x]  Normal appearance of external nose and ears       []  OP clear    []  MMM, no lesions on lips or gums, dentition good        []   Hearing stable   []        Neck    [x]  Neck of normal appearance and trachea midline        []  No thyromegaly, nodules, or tenderness   []        Lymph    []  No LAD in neck       []  No LAD in supraclavicular area       []  No LAD in axillae   []  No LAD in epitrochlear chains       []  No LAD in inguinal areas  []        CV    [x]  RRR, no m/r/g, S1/S2       []  No peripheral edema, WWP       []  Pedal pulses intact   []        Resp    [x]  Normal WOB       []  CTAB   [x]  Rhonchorous breath sounds bilaterally on anterior lung fields      GI    [x]  Normal inspection, NTND, NABS       []  No umbilical hernia on exam       []  No hepatosplenomegaly       []  Inspection of perineal and perianal areas normal  []        GU    []  Normal external genitalia       [x]  No urinary catheter present in urethra   []        MSK    []  No clubbing or cyanosis of hands       []  No focal tenderness or abnormalities on palpation of joints in RUE, LUE, RLE, or LLE  []        Skin    []  No rashes, lesions, or ulcers of visualized skin       [x]  Skin warm and dry to palpation   []        Neuro    [x]  CNs II-XII grossly intact       []  Sensation to light touch grossly intact throughout   []  DTRs normal and symmetric throughout   []  Unable to assess due to critical illness, sedation, or mental status  []        Psych    [x]  Appropriate affect      [x]  Oriented to person, place, time      [x]  Judgment and insight are appropriate   []  Unable to assess due to critical illness, sedation, or mental status  []               Patient Lines/Drains/Airways Status       Active Active Lines, Drains, & Airways       Name Placement date Placement time Site Days    Peripheral IV 06/11/22 Anterior;Left Forearm 06/11/22  --  Forearm  8                      Data for ID Decision Making  ( IDGENCONMDM )       Micro & Serological Data   ( RSLTMICRO  /  [id number]  /  00CXSRC  /  00CXRES  /  00CXSUSC )    Microbiology Results (last day)       ** No results found for the last 24 hours. **                      7/5 Influenza/RSV/COVID PCR negative for all  7/6  MRSA Screen not detected  7/7 lower respiratory cx positive for achromobacter species  7/9 lower respiratory cx positive achromobacter species    Recent Studies  ( RISRSLT )  No results found.                  Initial Consult Documentation from June 12, 2022     Sources of information include: chart review and patient.    History of Present Illness:     Kristopher Obrien is a 71 y.o. male with a past medical history significant of bronchiectasis (on 2L O2), poorly controlled COPD, previous tobacco use disorder, chronic pain (on suboxone), history of multi-drug resistant PsA pneumonia, and pulmonary hypertension who presented to the ED on 7/5 due to SOB.     Patient reports a history of recurrent spells where he experiences increased SOB and coughing up of sputum. This time around, in addition to SOB, patient reports a 1-2 day history of change in sputum and subjective wheeziness. He describes his sputum as a green in color, and feels that his sputum has become thicker and darker this time. Patient denies any chest pain or unilateral swelling/pain in his legs. He reports at baseline, he normally can go throughout his day without any need for supplemental oxygen, though he does use 2L of oxygen as needed at home. Reports administering as much as 4.5L of oxygen at home at the time of symptom onset with minimal relief. He used his home albuterol inhaler and brovana with little relief, prompting him to present to the ED.     Regarding his history of frequent ED visits for pulmonary-related issues, he was most recently admitted at [facility] for an acute exacerbation of bronchiectasis with lower respiratory tract infection. Patient was found to be positive for Achromobacter on LRCx. He was treated with a number of antimicrobial agents, including levaquin, primaxin, tobramycin, ceftazidime, and azithromycin. He was discharged on azithromycin. Patient reports not feeling much better following discharge from [facility] last week.     Per chart review, it appears as though patient was discharged on different occasions with plans to follow-up with various specialists outpatient. Follow-up and compliance has been an issue in the past. However, patient reports making an appointment to see Dr. Okey Regal following his most recent discharge from [facility] and is scheduled to see him in October 2023. Patient reports good compliance with home pulmonary and airway management. He states feeling weaker and weaker after every hospital admission.     He has 2 dogs at home, lives in a house on land owned by family, has not traveled outside of [place] recently. He was a Recruitment consultant in [employer] and has not been explicitly exposed to [employer].  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 He was diagnosed with COPD 8-10 years ago. He used to smoke cigarettes but quit 2-3 years ago. He has not been around anyone who has been sick.      Past Medical History   Past medical history described in HPI.      Meds and Allergies  Patient has a current medication list which includes the following prescription(s): albuterol, arformoterol, azithromycin, buprenorphine-naloxone, famotidine, ferrous sulfate, gabapentin, sodium chloride, aspirin, and aerobika oscillating pep systm, and the following Facility-Administered Medications: arformoterol, budesonide, buprenorphine-naloxone, enoxaparin, famotidine, ferrous sulfate, gabapentin, ipratropium-albuterol, levofloxacin, and sodium chloride 7%.    Allergies: Patient has no known allergies.       Social History  Social history  described in HPI.    Scribe's Attest:  Sarita Haver, MD obtained and performed the history, physical exam and medical decision making elements that were entered into the chart. Documentation assistance was provided by me personally. Signed by Velna Ochs, Scribe, on June 19, 2022 at 1:25 PM.     Provider???s Attestation:   Documentation assistance provided by the Scribe, [employer]. I was present during the time the encounter was Sarita Haver, MD. June 19, 2022 at 1:25 PM

## 2022-06-20 LAB — CBC W/ AUTO DIFF
BASOPHILS ABSOLUTE COUNT: 0.1 10*9/L (ref 0.0–0.1)
BASOPHILS RELATIVE PERCENT: 0.9 %
EOSINOPHILS ABSOLUTE COUNT: 0.4 10*9/L (ref 0.0–0.5)
EOSINOPHILS RELATIVE PERCENT: 3.4 %
HEMATOCRIT: 32 % — ABNORMAL LOW (ref 39.0–48.0)
HEMOGLOBIN: 10.5 g/dL — ABNORMAL LOW (ref 12.9–16.5)
LYMPHOCYTES ABSOLUTE COUNT: 1.1 10*9/L (ref 1.1–3.6)
LYMPHOCYTES RELATIVE PERCENT: 8 %
MEAN CORPUSCULAR HEMOGLOBIN CONC: 33 g/dL (ref 32.0–36.0)
MEAN CORPUSCULAR HEMOGLOBIN: 28.5 pg (ref 25.9–32.4)
MEAN CORPUSCULAR VOLUME: 86.4 fL (ref 77.6–95.7)
MEAN PLATELET VOLUME: 8.3 fL (ref 6.8–10.7)
MONOCYTES ABSOLUTE COUNT: 0.7 10*9/L (ref 0.3–0.8)
MONOCYTES RELATIVE PERCENT: 5.3 %
NEUTROPHILS ABSOLUTE COUNT: 10.8 10*9/L — ABNORMAL HIGH (ref 1.8–7.8)
NEUTROPHILS RELATIVE PERCENT: 82.4 %
PLATELET COUNT: 229 10*9/L (ref 150–450)
RED BLOOD CELL COUNT: 3.7 10*12/L — ABNORMAL LOW (ref 4.26–5.60)
RED CELL DISTRIBUTION WIDTH: 14.9 % (ref 12.2–15.2)
WBC ADJUSTED: 13.1 10*9/L — ABNORMAL HIGH (ref 3.6–11.2)

## 2022-06-20 MED ORDER — TOBRAMYCIN 300 MG/4 ML SOLUTION FOR NEBULIZATION
Freq: Two times a day (BID) | RESPIRATORY_TRACT | 11 refills | 0 days | Status: CP
Start: 2022-06-20 — End: 2023-06-20
  Filled 2022-06-30: qty 224, 56d supply, fill #0

## 2022-06-20 MED ORDER — TOBRAMYCIN 300 MG/5 ML IN 0.225 % SODIUM CHLORIDE FOR NEBULIZATION
RESPIRATORY_TRACT | 0 refills | 0 days
Start: 2022-06-20 — End: 2022-06-20

## 2022-06-20 MED ADMIN — sodium chloride 7% NEBULIZER solution 4 mL: 4 mL | RESPIRATORY_TRACT | @ 17:00:00 | Stop: 2022-06-20

## 2022-06-20 MED ADMIN — budesonide (PULMICORT) nebulizer solution 0.5 mg: .5 mg | RESPIRATORY_TRACT | @ 12:00:00 | Stop: 2022-06-20

## 2022-06-20 MED ADMIN — buprenorphine-naloxone (SUBOXONE) 2-0.5 mg SL tablet 8 mg of buprenorphine: 4 | SUBLINGUAL | @ 18:00:00 | Stop: 2022-06-20

## 2022-06-20 MED ADMIN — arformoterol (BROVANA) nebulizer solution 15 mcg/2 mL: 15 ug | RESPIRATORY_TRACT | @ 12:00:00 | Stop: 2022-06-20

## 2022-06-20 MED ADMIN — ipratropium-albuteroL (DUO-NEB) 0.5-2.5 mg/3 mL nebulizer solution 6 mL: 6 mL | RESPIRATORY_TRACT | @ 12:00:00 | Stop: 2022-06-20

## 2022-06-20 MED ADMIN — buprenorphine-naloxone (SUBOXONE) 2-0.5 mg SL tablet 8 mg of buprenorphine: 4 | SUBLINGUAL | @ 12:00:00 | Stop: 2022-06-20

## 2022-06-20 MED ADMIN — gabapentin (NEURONTIN) capsule 900 mg: 900 mg | ORAL | @ 18:00:00 | Stop: 2022-06-20

## 2022-06-20 MED ADMIN — ferrous sulfate tablet 325 mg: 325 mg | ORAL | @ 16:00:00 | Stop: 2022-06-20

## 2022-06-20 MED ADMIN — ipratropium-albuteroL (DUO-NEB) 0.5-2.5 mg/3 mL nebulizer solution 6 mL: 6 mL | RESPIRATORY_TRACT | @ 17:00:00 | Stop: 2022-06-20

## 2022-06-20 MED ADMIN — sodium chloride 7% NEBULIZER solution 4 mL: 4 mL | RESPIRATORY_TRACT | @ 12:00:00 | Stop: 2022-06-20

## 2022-06-20 MED ADMIN — levoFLOXacin (LEVAQUIN) tablet 750 mg: 750 mg | ORAL | @ 12:00:00 | Stop: 2022-06-20

## 2022-06-20 MED ADMIN — famotidine (PEPCID) tablet 20 mg: 20 mg | ORAL | @ 12:00:00 | Stop: 2022-06-20

## 2022-06-20 MED ADMIN — gabapentin (NEURONTIN) capsule 900 mg: 900 mg | ORAL | @ 12:00:00 | Stop: 2022-06-20

## 2022-06-20 MED ADMIN — enoxaparin (LOVENOX) syringe 40 mg: 40 mg | SUBCUTANEOUS | @ 12:00:00 | Stop: 2022-06-20

## 2022-06-20 NOTE — Unmapped (Signed)
Patient continued on a nasal cannula this shift. The patient received inhaled respiratory medications as scheduled this shift without event or adverse effect. Airway clearance provided via the Aerobika this shift, No other changes or respiratory interventions made at this time. Will continue to assess the patient's respiratory status.    Problem: Breathing Pattern Ineffective  Goal: Effective Breathing Pattern  Outcome: Ongoing - Unchanged  Intervention: Promote Improved Breathing Pattern  Recent Flowsheet Documentation  Taken 06/19/2022 2025 by Lily Peer, RRT  Breathing Techniques/Airway Clearance: deep/controlled cough encouraged

## 2022-06-20 NOTE — Unmapped (Signed)
Pt is being dcd [facility], report called to  Habiyah  , pt aware of dc at 1300, tolerated all meds, pt is packed and ready to go, he refused his pneumonia vaccine, refused to eat lunch at this time.  Problem: Adult Inpatient Plan of Care  Goal: Plan of Care Review  Outcome: Transitioned to Another Facility  Goal: Patient-Specific Goal (Individualized)  Outcome: Transitioned to Another Facility  Goal: Absence of Hospital-Acquired Illness or Injury  Outcome: Transitioned to Another Facility  Intervention: Identify and Manage Fall Risk  Recent Flowsheet Documentation  Taken 06/20/2022 0800 by Enzo Bi, RN  Safety Interventions:   low bed   fall reduction program maintained   infection management   isolation precautions  Goal: Optimal Comfort and Wellbeing  Outcome: Transitioned to Another Facility  Goal: Readiness for Transition of Care  Outcome: Transitioned to Another Facility  Goal: Rounds/Family Conference  Outcome: Transitioned to Another Facility

## 2022-06-20 NOTE — Unmapped (Signed)
[facility] Specialty Medication Onboarding    Specialty Medication: Bethkis  Prior Authorization: Not Required   Financial Assistance: No - copay  <$25  Final Copay/Day Supply: $4 / 56    Insurance Restrictions: None     Notes to Pharmacist: None    The triage team has completed the benefits investigation and has determined that the patient is able to fill this medication at [facility]. Please contact the patient to complete the onboarding or follow up with the prescribing physician as needed.

## 2022-06-20 NOTE — Unmapped (Signed)
Physician Discharge Summary [facility]  [address] [facility]  [address]  [place] [address]  Dept: [phone]  Loc: [phone]     Identifying Information:   Kristopher Obrien  1951-11-17  [id number]    Primary Care Physician: Tyson Babinski S[facility]     Code Status: DNR and DNI    Admit Date: 06/11/2022    Discharge Date: 06/20/2022     Discharge To: Skilled nursing facility    Discharge Service: [facility] - General Medicine Floor Team MED L - Tower     Discharge Attending Physician: Judd Lien, MD    Discharge Diagnoses:   Principal Problem:    Bronchiectasis with acute exacerbation (CMS-HCC) POA: Yes  Active Problems:    COPD exacerbation (CMS-HCC) POA: Yes    History of MDR Pseudomonas aeruginosa infection POA: Yes    Buprenorphine dependence (CMS-HCC) POA: Yes  Resolved Problems:    * No resolved hospital problems. *      Hospital Course:   Kristopher Obrien is a 71 y.o. male whose presentation is complicated by bronchiectasis on 2 L O2, tobacco use disorder, opioid use disorder, and multidrug resistant PsA PNA who presented to [facility] with dyspnea, concerning for bronchiectasis with acute exacerbation, improved on discharge. See hospital course by problem list as below.      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  CXR obtained showing emphysema and chronic change, no acute evidence for concomitant infection. Trigger unclear, however on prior hospitalizations, some concern for chronic aspiration. Patient given steroids and later initiated on BiPAP on admission. Patient treated for bronchiectasis exacerbation with five day prednisone course, airway clearance with Aerobika and hypertonic saline. He was started on Levaquin for hx of multidrug-resistant Pseudomonas. Later found to have achromobacter on lower respiratory culture. Appropriately covered with Levaquin for 10 day course to end 7/15. He was weaned off BiPAP closer to baseline at 4L . Patient was optimized on COPD treatment regimen with, arformoterol, budesonide, and duonebs; he will continue this on discharge with outpatient follow-up with pulmonology scheduled for end of Aug. He will also be started on tobramycin for Pseudomonal prophylaxis given frequent hospitalizations, per ID recs. He was given Prevnar-20 vaccine and will discharge to SNF with oxygen requirement at baseline. Ongoing GOC discussions with patient and family given end-stage lung disease, to be scheduled for outpatient follow-up with palliative care.     Chronic problems  Opioid use disorder: Continued 8mg  tablet suboxone TID  GERD: Continued famotidine BID  Iron deficiency anemia: Continued daily iron    The patient's hospital stay has been complicated by the following clinically significant conditions requiring additional evaluation and treatment or having a significant effect of this patient's care: - Anemia POA requiring further investigation or monitoring           Outpatient Provider Follow Up Issues:   Outpatient pulmonology follow-up for further optimization and adherence to treatment regimen.  Palliative care follow-up for ongoing GOC discussions.    Touchbase with Outpatient Provider:  Warm Handoff: Not completed secondary to discharge to SNF.    Procedures:  None.  ______________________________________________________________________  Discharge Medications:      Your Medication List        ASK your doctor about these medications  AEROBIKA OSCILLATING PEP SYSTM Devi  Generic drug: mucus clearing device  1 Dose by Miscellaneous route two (2) times a day.     albuterol 90 mcg/actuation inhaler  Commonly known as: PROVENTIL HFA;VENTOLIN HFA  Inhale 2 puffs every four (4) hours as needed for wheezing or shortness of breath.  Ask about: Which instructions should I use?     arformoteroL 15 mcg/2 mL  Commonly known as: BROVANA  Inhale 2 mL (15 mcg total) by nebulization two (2) times a day.     aspirin 325 MG tablet  Take 2 tablets (650 mg total) by mouth daily as needed (headache).     azithromycin 250 MG tablet  Commonly known as: ZITHROMAX  Take 1 tablet (250 mg total) by mouth daily.     buprenorphine-naloxone 8-2 mg sublingual tablet  Commonly known as: SUBOXONE  Place 1 tablet (8 mg of buprenorphine total) under the tongue Three (3) times a day for 5 days.  Ask about: Which instructions should I use?     famotidine 20 MG tablet  Commonly known as: PEPCID  Take 1 tablet (20 mg total) by mouth Two (2) times a day.     ferrous sulfate 325 (65 FE) MG tablet  Take 1 tablet (325 mg total) by mouth every other day.     gabapentin 300 MG capsule  Commonly known as: NEURONTIN  Take 3 capsules (900 mg total) by mouth Three (3) times a day.     sodium chloride 10 % Nebu  Inhale 5 mL by nebulization two (2) times a day.              Allergies:  Patient has no known allergies.  ______________________________________________________________________  Pending Test Results:  Pending Labs       Order Current Status    Aerobic Susceptibility-[facility] In process    Lower Respiratory Culture Preliminary result    Micro Add-on Preliminary result            Most Recent Labs:  All lab results last 24 hours -   Recent Results (from the past 24 hour(s))   CBC w/ Differential    Collection Time: 06/20/22  5:59 AM   Result Value Ref Range    WBC 13.1 (H) 3.6 - 11.2 10*9/L    RBC 3.70 (L) 4.26 - 5.60 10*12/L    HGB 10.5 (L) 12.9 - 16.5 g/dL    HCT 51.8 (L) 84.1 - 48.0 %    MCV 86.4 77.6 - 95.7 fL    MCH 28.5 25.9 - 32.4 pg    MCHC 33.0 32.0 - 36.0 g/dL    RDW 66.0 63.0 - 16.0 %    MPV 8.3 6.8 - 10.7 fL    Platelet 229 150 - 450 10*9/L    Neutrophils % 82.4 %    Lymphocytes % 8.0 %    Monocytes % 5.3 %    Eosinophils % 3.4 %    Basophils % 0.9 % Absolute Neutrophils 10.8 (H) 1.8 - 7.8 10*9/L    Absolute Lymphocytes 1.1 1.1 - 3.6 10*9/L    Absolute Monocytes 0.7 0.3 - 0.8 10*9/L    Absolute Eosinophils 0.4 0.0 - 0.5 10*9/L    Absolute Basophils 0.1 0.0 - 0.1 10*9/L     BMP -   No results found for requested labs within last 2 days.       Relevant Studies/Radiology:  ECG 12 Lead    Result Date: 06/12/2022  SINUS TACHYCARDIA OTHERWISE NORMAL  ECG WHEN COMPARED WITH ECG OF 07-May-2022 14:39, NO SIGNIFICANT CHANGE WAS FOUND Confirmed by Pollyann Kennedy ([id number]) on 06/12/2022 10:50:16 PM    XR Chest Portable    Result Date: 06/12/2022  EXAM: XR CHEST PORTABLE DATE: 06/12/2022 12:01 AM ACCESSION: [id number] UN DICTATED: 06/12/2022 12:02 AM INTERPRETATION LOCATION: [facility] CLINICAL INDICATION: 71 years old Male with DYSPNEA  TECHNIQUE: Single View AP Chest Radiograph. COMPARISON: 05/27/2022 FINDINGS: Redemonstrated findings of emphysema and architectural distortion with patchy consolidation in the right upper lobe, left midlung, and right costophrenic angle. No new focal consolidation. No pleural effusion or pneumothorax. Unchanged cardiomediastinal silhouette.     Emphysema and chronic fibrotic changes, similar to prior radiograph. No acute abnormality identified.   ______________________________________________________________________  Discharge Instructions:   Activity Instructions       Activity as tolerated                       Follow Up instructions and Outpatient Referrals     Ambulatory referral to Palliative Care      Is this a palliative referral for:  goals of care  symptom management       Do you want ongoing co-management?: No    Care coordination required?: No    Call MD for:  difficulty breathing, headache or visual disturbances      Call MD for:  persistent nausea or vomiting      Call MD for:  severe uncontrolled pain      Call MD for:  temperature >38.5 Celsius      Discharge instructions          Appointments which have been scheduled for you      Aug 06, 2022  9:30 AM  (Arrive by 9:15 AM)  RETURN COPD with Marlyne Beards, MD  [facility] PULMONARY SPECIALTY CL [place] [facility]) [address]  [place] [address]  [phone]             ______________________________________________________________________  Discharge Day Services:  BP 113/63  - Pulse 111  - Temp 36.9 ??C (98.4 ??F) (Oral)  - Resp 16  - SpO2 96%     Pt seen on the day of discharge and determined appropriate for discharge.      Condition at Discharge: good    Length of Discharge: I spent greater than 30 mins in the discharge of this patient.     I attest that I have reviewed the medical student note and that the components of the history of the present illness, the physical exam, and the assessment and plan documented were performed by me or were performed in my presence by the student where I verified the documentation and performed (or re-performed) the exam and medical decision making. Marda Stalker, MD

## 2022-06-20 NOTE — Unmapped (Signed)
Pt VSS. Pt took all medication without any problems. Pt slept on and off throughout the night. No acute changes will continue to monitor.  Problem: Adult Inpatient Plan of Care  Goal: Plan of Care Review  Outcome: Ongoing - Unchanged  Goal: Patient-Specific Goal (Individualized)  Outcome: Ongoing - Unchanged  Goal: Absence of Hospital-Acquired Illness or Injury  Outcome: Ongoing - Unchanged  Goal: Optimal Comfort and Wellbeing  Outcome: Ongoing - Unchanged  Goal: Readiness for Transition of Care  Outcome: Ongoing - Unchanged  Goal: Rounds/Family Conference  Outcome: Ongoing - Unchanged     Problem: Infection  Goal: Absence of Infection Signs and Symptoms  Outcome: Ongoing - Unchanged     Problem: Self-Care Deficit  Goal: Improved Ability to Complete Activities of Daily Living  Outcome: Ongoing - Unchanged     Problem: Fall Injury Risk  Goal: Absence of Fall and Fall-Related Injury  Outcome: Ongoing - Unchanged     Problem: COPD (Chronic Obstructive Pulmonary Disease) Comorbidity  Goal: Maintenance of COPD Symptom Control  Outcome: Ongoing - Unchanged     Problem: Breathing Pattern Ineffective  Goal: Effective Breathing Pattern  Outcome: Ongoing - Unchanged

## 2022-06-21 NOTE — Unmapped (Signed)
CONTINUING CARE NETWORK  Enrollment Note        Patient admitted to [facility] and enrolled in the Continuing Care Network. Case Management to follow up within 1 week to complete medication reconciliation and discuss transition planning.

## 2022-06-23 NOTE — Unmapped (Signed)
[facility]   Patient Onboarding/Medication Counseling    Kristopher Obrien states he is progressing well at rehabilitation center and is under the impression he will return home soon. Since he lives with daughter, we will send tobramycin to arrive to home this Tuesday. In case he is not home by then, he agreed to inform daughter on delivery date since medication requires refrigeration.  All questions were answered.     Kristopher Obrien is a 71 y.o. male with recurrent pneumonia who I am counseling today on initiation of therapy.  I am speaking to the patient.    Was a Nurse, learning disability used for this call? No    Verified patient's date of birth / HIPAA.    Specialty medication(s) to be sent: CF/Pulmonary/Asthma: -BETHKIS (tobramycin 300mg /18mL) inhalation solution      Non-specialty medications/supplies to be sent: neb cup, HTS 10%, and albuterol inhaler      Medications not needed at this time: n/a       The patient declined counseling on missed dose instructions, goals of therapy, side effects and monitoring parameters, warnings and precautions, and drug/food interactions because they have taken the medication previously. The information in the declined sections below are for informational purposes only and was not discussed with patient. We reviewed dosing administration and storage requirements.       Bethkis (tobramycin)    Medication & Administration     Dosage: Inhale 1 ampule (300mg ) via nebulizer every 12 hours cycled 28 days on and 28 days off.     Administration:   Inhale contents of ampule sitting or standing upright and breathing normally through the mouthpiece of the nebulizer until there is no longer any mist being produced.    Usually last medication taken when on several inhaled therapies.    Adherence/Missed dose instructions: If you miss a dose of tobramycin Inhalation and it is 6 hours or less from the time you usually take your dose, then take your dose as soon as you can, then resume your next dose at the usual time. Otherwise skip the dose, and resume at your next scheduled dose.    Goals of Therapy     To treat or control bacterial infection in lungs    Side Effects & Monitoring Parameters     Voice alterations, loss of voice  Throat irritation  Bronchospasm     The following side effects should be reported to the provider:  Tinnitus (ringing of the ears) or hearing loss    Contraindications, Warnings, & Precautions     Ototoxicity    Drug/Food Interactions     Medication list reviewed in Epic. The patient was instructed to inform the care team before taking any new medications or supplements. No drug interactions identified.      Storage, Handling Precautions, & Disposal     Store in the refrigerator.  May be stored at room temperature for up to 28 days.      Current Medications (including OTC/herbals), Comorbidities and Allergies     Current Outpatient Medications   Medication Sig Dispense Refill    albuterol HFA 90 mcg/actuation inhaler Inhale 2 puffs every four (4) hours as needed for wheezing or shortness of breath. 8 g 12    arformoteroL (BROVANA) 15 mcg/2 mL Inhale 2 mL (15 mcg total) by nebulization two (2) times a day. 120 mL 0    aspirin 325 MG tablet Take 2 tablets (650 mg total) by mouth daily as needed (headache).  azithromycin (ZITHROMAX) 250 MG tablet Take 1 tablet (250 mg total) by mouth daily. 90 tablet 3    budesonide (PULMICORT) 0.5 mg/2 mL nebulizer solution Inhale 2 mL (0.5 mg total) by nebulization in the morning. 60 mL 11    buprenorphine-naloxone (SUBOXONE) 8-2 mg sublingual tablet Place 1 tablet (8 mg of buprenorphine total) under the tongue Three (3) times a day for 5 days. 15 tablet 0    famotidine (PEPCID) 20 MG tablet Take 1 tablet (20 mg total) by mouth Two (2) times a day. 60 tablet 0    ferrous sulfate 325 (65 FE) MG tablet Take 1 tablet (325 mg total) by mouth every other day. 15 tablet 11    gabapentin (NEURONTIN) 300 MG capsule Take 3 capsules (900 mg total) by mouth Three (3) times a day.      ipratropium-albuteroL (DUO-NEB) 0.5-2.5 mg/3 mL nebulizer Inhale 6 mL by nebulization in the morning and 6 mL at noon and 6 mL in the evening.  0    levoFLOXacin (LEVAQUIN) 750 MG tablet Take 1 tablet (750 mg total) by mouth daily. 1 tablet 0    mucus clearing device (AEROBIKA OSCILLATING PEP SYSTM) Devi 1 Dose by Miscellaneous route two (2) times a day. 1 each 0    sodium chloride 10 % Nebu Inhale 5 mL by nebulization two (2) times a day. Discard remaining solution. Use a new vial for each dose. 300 mL 0    tobramycin 300 mg/4 mL Nebu Inhale the contents of 1 vial (300 mg) Two (2) times a day. Alternate 28 days on, 28 days off. 224 mL 11     No current facility-administered medications for this visit.       No Known Allergies    Patient Active Problem List   Diagnosis    COPD exacerbation (CMS-HCC)    Mediastinal lymphadenopathy    Pulmonary nodule    Bronchiectasis with acute exacerbation (CMS-HCC)    Severe pneumonia    Stage 4 very severe COPD by GOLD classification (CMS-HCC)    Chronic pain due to trauma    Pleuritic chest pain    Recurrent pneumonia    Dyspnea and respiratory abnormalities    History of MDR Pseudomonas aeruginosa infection    History of smoking greater than 50 pack years    Buprenorphine dependence (CMS-HCC)       Reviewed and up to date in Epic.    Appropriateness of Therapy     Acute infections noted within Epic:  MDR Pseudomonas  Patient reported infection:  02/11/22 Lower respiratory culture resulted in 1+ Pseudomonas aeruginosa. Most recent lower respiratory culture (7/9) resulted in 2+ Achromobacter species.    Is medication and dose appropriate based on diagnosis and infection status? Yes - treatment is indicated based on previous pseudomonas isolates since achromobacter species are intrinsically resistant to aminoglycosides.     Prescription has been clinically reviewed: Yes      Baseline Quality of Life Assessment      How many days over the past month did your recurrent pneumonias  keep you from your normal activities? For example, brushing your teeth or getting up in the morning. Kristopher Obrien states he is currently at a rehabilitation center and is progressing well enough to be discharged soon.     Financial Information     Medication Assistance provided: None Required    Anticipated copay of $4 / 56 days reviewed with patient. Verified delivery address.    Delivery Information  Scheduled delivery date: 07/01/22    Expected start date: 07/01/22    Medication will be delivered via UPS to the prescription address in [facility].  This shipment will not require a signature.      Explained the services we provide at [facility] and that each month we would call to set up refills.  Stressed importance of returning phone calls so that we could ensure they receive their medications in time each month.  Informed patient that we should be setting up refills 7-10 days prior to when they will run out of medication.  A pharmacist will reach out to perform a clinical assessment periodically.  Informed patient that a welcome packet, containing information about our pharmacy and other support services, a Notice of Privacy Practices, and a drug information handout will be sent.      The patient or caregiver noted above participated in the development of this care plan and knows that they can request review of or adjustments to the care plan at any time.      Patient or caregiver verbalized understanding of the above information as well as how to contact the pharmacy at [phone] option 4 with any questions/concerns.  The pharmacy is open Monday through Friday 8:30am-4:30pm.  A pharmacist is available 24/7 via pager to answer any clinical questions they may have.    Patient Specific Needs     Does the patient have any physical, cognitive, or cultural barriers? No    Does the patient have adequate living arrangements? (i.e. the ability to store and take their medication appropriately) Yes - lives with daughter    Did you identify any home environmental safety or security hazards? No    Patient prefers to have medications discussed with  Patient     Is the patient or caregiver able to read and understand education materials at a high school level or above? Yes    Patient's primary language is  English     Is the patient high risk? No    SOCIAL DETERMINANTS OF HEALTH     At the [facility], we have learned that life circumstances - like trouble affording food, housing, utilities, or transportation can affect the health of many of our patients.   That is why we wanted to ask: are you currently experiencing any life circumstances that are negatively impacting your health and/or quality of life? Patient declined to answer    Social Determinants of Health     Financial Resource Strain: Low Risk     Difficulty of Paying Living Expenses: Not hard at all   Internet Connectivity: Not on file   Food Insecurity: No Food Insecurity    Worried About Programme researcher, broadcasting/film/video in the Last Year: Never true    Barista in the Last Year: Never true   Tobacco Use: Medium Risk    Smoking Tobacco Use: Former    Smokeless Tobacco Use: Never    Passive Exposure: Not on file   Housing/Utilities: Low Risk     Within the past 12 months, have you ever stayed: outside, in a car, in a tent, in an overnight shelter, or temporarily in someone else's home (i.e. couch-surfing)?: No    Are you worried about losing your housing?: No    Within the past 12 months, have you been unable to get utilities (heat, electricity) when it was really needed?: No   Alcohol Use: Not on file   Transportation  Needs: No Transportation Needs    Lack of Transportation (Medical): No    Lack of Transportation (Non-Medical): No   Substance Use: Not on file   Health Literacy: Not on file   Physical Activity: Not on file   Interpersonal Safety: Not on file   Stress: Not on file   Intimate Partner Violence: Not on file   Depression: Not on file   Social Connections: Not on file       Would you be willing to receive help with any of the needs that you have identified today? Not applicable       Oliva Bustard  [facility] Specialty Pharmacist

## 2022-06-24 ENCOUNTER — Ambulatory Visit: Admit: 2022-06-24 | Discharge: 2022-06-25 | Disposition: A | Discharge status: Home / Self Care | Payer: MEDICARE

## 2022-06-24 NOTE — Unmapped (Signed)
Addendem: today pt to ED w/ neck pain    CONTINUING CARE NETWORK  PATIENT ENCOUNTER NOTE    Summary:  Continuing Care Network Case Manager spoke with patient today for Case Management introduction. Patient instructed on the role of [facility] Network case Production designer, theatre/television/film in their care. Patient encouraged to reach out with questions or concerns.     Patient states goal is to be with family again.   CCN contacted SNF re : pt's rxs w/ SNF         Objective:   Problem List     ??? Severe pneumonia    ??? Stage 4 very severe COPD by GOLD classification    ??? COPD exacerbation    ??? Mediastinal disease    ??? Lung nodule    ??? Abnormal dilation of airways with exacerbation    ??? Chronic pain due to injury    ??? Pleuritic chest pain    ??? Recurrent pneumonia    ??? Trouble breathing    ??? History of MDR Pseudomonas aeruginosa infection         Perlie Gold, RN  06/24/2022

## 2022-06-25 MED ORDER — NAPROXEN 500 MG TABLET
ORAL_TABLET | Freq: Two times a day (BID) | ORAL | 0 refills | 7 days | Status: CP
Start: 2022-06-25 — End: 2022-07-02

## 2022-06-25 MED ADMIN — budesonide (PULMICORT) nebulizer solution 0.5 mg: .5 mg | RESPIRATORY_TRACT | @ 15:00:00 | Stop: 2022-06-25

## 2022-06-25 MED ADMIN — buprenorphine-naloxone (SUBOXONE) 8-2 mg SL film 8 mg of buprenorphine: 1 | SUBLINGUAL | @ 16:00:00 | Stop: 2022-06-25

## 2022-06-25 MED ADMIN — arformoterol (BROVANA) nebulizer solution 15 mcg/2 mL: 15 ug | RESPIRATORY_TRACT | @ 15:00:00 | Stop: 2022-06-25

## 2022-06-25 MED ADMIN — ketorolac (TORADOL) injection 30 mg: 30 mg | INTRAMUSCULAR | @ 06:00:00 | Stop: 2022-06-25

## 2022-06-25 MED ADMIN — ipratropium-albuteroL (DUO-NEB) 0.5-2.5 mg/3 mL nebulizer solution 6 mL: 6 mL | RESPIRATORY_TRACT | @ 15:00:00 | Stop: 2022-06-25

## 2022-06-25 MED ADMIN — azithromycin (ZITHROMAX) tablet 500 mg: 500 mg | ORAL | @ 16:00:00 | Stop: 2022-06-25

## 2022-06-25 MED ADMIN — sodium chloride 7% NEBULIZER solution 4 mL: 4 mL | RESPIRATORY_TRACT | @ 15:00:00 | Stop: 2022-06-25

## 2022-06-25 MED ADMIN — tobramycin (PF) (TOBI) 300 mg/5 mL nebulizer solution 300 mg: 300 mg | RESPIRATORY_TRACT | @ 15:00:00 | Stop: 2022-06-25

## 2022-06-25 MED ADMIN — lidocaine (LIDODERM) 5 % patch 1 patch: 1 | TRANSDERMAL | @ 06:00:00 | Stop: 2022-06-25

## 2022-06-25 MED ADMIN — famotidine (PEPCID) tablet 20 mg: 20 mg | ORAL | @ 16:00:00 | Stop: 2022-06-25

## 2022-06-25 MED ADMIN — levoFLOXacin (LEVAQUIN) tablet 750 mg: 750 mg | ORAL | @ 16:00:00 | Stop: 2022-06-25

## 2022-06-25 MED ADMIN — gabapentin (NEURONTIN) capsule 900 mg: 900 mg | ORAL | @ 16:00:00 | Stop: 2022-06-25

## 2022-06-25 NOTE — Unmapped (Addendum)
PHYSICAL THERAPY  Evaluation (06/25/22 1019)          Patient Name:  Kristopher Obrien       Medical Record Number: [id number]   Date of Birth: August 31, 1951  Sex: Male        Treatment Diagnosis: order: placement; decreased functional strength, endurance, and balance     Activity Tolerance: Tolerated treatment well     ASSESSMENT  Problem List: Shortness of breath, Decreased endurance, Fall risk, Decreased mobility, Gait deviation, Impaired balance, Decreased safety awareness      Assessment : 71 y.o. male who has a past medical history of Bronchiectasis (CMS-HCC), Encounter for monitoring Suboxone maintenance therapy, Pneumonia of left lower lobe due to Pseudomonas species (CMS-HCC), and Pulmonary emphysema (CMS-HCC). who presents with neck pain.    Pt presenting from shortterm rehab. Prior to rehab, was independent community ambulator without DME needs. Pt presents today with deficits in functional mobility, endurance, and balance. Deficits in dynamic balance during gait requiring CGA-min assist identified. Reporting decreased functional endurance with mobility compared to PLOF. Pt would continue to benefit from acute PT in order to address above impairements and help return to PLOF. At this time, pt would currently benefit from [facility] for postacute, but does have potential with PT to reach 3X.    After a review of the personal factors, comorbidities, clinical presentation, and examination of the number of affected body systems, the patient presents as a low complexity case.       Today's Interventions: PT Evaluation, bed mobility, transfers, ambulation, endurance training, static/dynamic standing balance, pt education: role of PT, POC, DC, fall risk reduction, progressive mobility, AMPAC 20/24                     Clinical Decision Making: Low      PLAN  Planned Frequency of Treatment:  1-2x per day for: 3-4x week       Planned Interventions: Diaphragmatic / Pursed-lip breathing, Education - Patient, Stair training, Therapeutic exercise, Therapeutic activity, Gait training, Functional mobility, Endurance activities, Balance activities, Education - Family / caregiver, Acupuncturist Physical Therapy Recommendations:  Skilled PT services indicated, 5x weekly, Low intensity (potential to progress to 3X)     PT DME Recommendations: Defer to post acute            Goals:   Patient and Family Goals: To be able to walk to and from his car and drive to see his friends     Long Term Goal #1: Patient will be able to ambulate 500 feet with LRAD and Mod I in 8 weeks        SHORT GOAL #1: Pt will perform all functional transfers mod I with LRAD/independent                  SHORT GOAL #2: Pt will ambulate 100' with LRAD mod I/independently              Time Frame : 2 weeks  SHORT GOAL #3: Pt will perform 3 stairs with bilateral railings SBA.              Time Frame : 2 weeks                                            Prognosis:  Good  Positive Indicators: PLOF,  motivated, caregiver support  Barriers to Discharge: Endurance deficits, Decreased safety awareness, Inability to safely perform ADLS     SUBJECTIVE  Patient reports: Agreeable to PT They just sent me here after listening to my lungs  Current Functional Status: Patient received and left semi-reclined in bed with all needs met. Call light within reach.  Services patient receives: PT, OT  Prior Functional Status: has only done 1 PT session at rehab ([facility]) since going-coming from per last H&P on 06/17/22: 'Prior to 3 weeks ago, patient reports Indepednence with all functional mobility and ADLs. Patient ambulates household and community distances without AD. Endorses no falls. Home O2 available prn - patient reports being on 3L at rest and 5L when moving around house, but was able to ambulate around grocery store without O2. Patient enjoys taking his granddaughter out breakfast and to a bookstore on Sunday mornings and wants to spend as much time with her as possible.'  Equipment available at home: None      Past Medical History:   Diagnosis Date    Bronchiectasis (CMS-HCC)     Encounter for monitoring Suboxone maintenance therapy     Pneumonia of left lower lobe due to Pseudomonas species (CMS-HCC)     Pulmonary emphysema (CMS-HCC)             Social History     Tobacco Use    Smoking status: Former     Packs/day: 1.00     Years: 50.00     Pack years: 50.00     Types: Cigarettes     Start date: 11/23/1967     Quit date: 12/09/2019     Years since quitting: 2.5    Smokeless tobacco: Never   Substance Use Topics    Alcohol use: Not Currently       Past Surgical History:   Procedure Laterality Date    PR COLONOSCOPY W/BIOPSY SINGLE/MULTIPLE  08/01/2020    Procedure: COLONOSCOPY, FLEXIBLE, PROXIMAL TO SPLENIC FLEXURE; WITH BIOPSY, SINGLE OR MULTIPLE;  Surgeon: Kela Millin, MD;  [facility];  Service: Gastroenterology    PR COLSC FLX W/RMVL OF TUMOR POLYP LESION SNARE TQ N/A 08/01/2020    Procedure: COLONOSCOPY FLEX; W/REMOV TUMOR/LES BY SNARE;  Surgeon: Kela Millin, MD;  Location: [facility];  Service: Gastroenterology    PR COLSC FLX WITH DIRECTED SUBMUCOSAL NJX ANY SBST N/A 08/01/2020    Procedure: COLONOSCOPY, FLEXIBLE, PROXIMAL TO SPLENIC FLEXURE; WITH DIRECTED SUBMUCOSAL INJECTION(S), ANY SUBSTANCE;  Surgeon: Kela Millin, MD;  [facility]: [facility];  Service: Gastroenterology             Family History   Problem Relation Age of Onset    Hypertension Mother         Allergies: Patient has no known allergies.                  Objective Findings  Precautions / Restrictions  Precautions: Isolation precautions, Falls precautions (contact)  Weight Bearing Status: Non-applicable  Required Braces or Orthoses: Non-applicable     Communication Preference: Verbal          Pain Comments: denies pain  Medical Tests / Procedures: Labs, orders, and imaging reviewed via Counselling psychologist / Environment: Vascular access (PIV, TLC, Port-a-cath, PICC), Supplemental oxygen, Patient not wearing mask for full session     At Rest: VSS per EPIC; on 4L, 91% SpO2 sitting EBO  With Activity: on 4L @ lowest  89% and 115 bpm  Orthostatics: asymptomatic  Airway Clearance: mobility, purse lip breathing     Living Situation  Living Environment: Trailer (prior to stay @ [facility])  Lives With: Family (prior to stay @ [facility])  Home Living: One level home, Tub/shower unit, Standard height toilet, Stairs to enter with rails (prior to stay @ [facility])  Rail placement (outside): Bilateral rails  Number of Stairs to Enter (outside): 3 (prior to stay @ [facility])      Cognition: WFL  Visual/Perception: Wears Glasses/Contacts (for reading)     Skin Inspection comment: flaky     Upper Extremities  UE ROM: Right WFL, Left WFL  UE Strength: Right WFL, Left WFL    Lower Extremities  LE ROM: Right Impaired/Limited, Left Impaired/Limited  RLE ROM Impairment: Limited AROM, Pain with movement  LLE ROM Impairment: Limited AROM, Pain with movement  LE Strength: Right WFL, Left WFL     Coordination comment: increased unsteadiness  Proprioception: Not tested  Sensation: WFL  Balance: Impaired, Impaired dynamic standing balance  Balance comment: increased unsteadiness; min assist-CGA without AD  Posture: WFL      Bed Mobility: Supine to Sit  Supine to Sit assistance level: Modified independent, requires aide device or extra time     Transfers: Sit to Stand  Sit to Stand assistance level: Contact guard assist, steadying assist  Transfer comments: no DME      Gait Level of Assistance: Minimal assist, patient does 75% or more  Gait Assistive Device: None  Gait Distance Ambulated (ft): 75 ft  Gait: pt ambulated 75' CGA-min assist, noted weight shift to lateral part of leg and posterior weight shift on leg leading to increased instability     Stairs: NT            Endurance: increased RR/WOB compared to baseline. on 2-3L at baseline but now is on 4L (has been on 4L reportedly since previous hospitalization)     Physical Therapy Session Duration  PT Individual [mins]: 19     Medical Staff Made Aware: RN     I attest that I have reviewed the above information.  Signed: Levon Hedger, PT  Filed 06/25/2022

## 2022-06-25 NOTE — Unmapped (Signed)
ED Progress Note    0700 AM  Patient signed out at 7 AM pending transportation.  Briefly, patient is a 71 year old male with a past medical history bronchiectasis, left lower lobe pneumonia and emphysema who presented with neck pain.  Was evaluated by overnight team and plan for discharge.  Signed out pending case management for transportation back to facility.        9:52 AM  Case management states that she reached outpatient facility and was told the patient needed PT OT to get insurance authorization to go back.  PT OT ordered.  Will order diet and medication list.  Pt did not come with medication list. Home meds ordered based on recent discharge medications.     1100 AM  Patient without complaints, states that he feels well.  PT completed, awaiting OT.    2:24 PM  Advised by case management that patient can now go back with current authorization given that he was here for less than 24 hours.  Will discharge with transportation back to facility.  OT consult canceled.

## 2022-06-25 NOTE — Unmapped (Signed)
Per Dr. Clinton Sawyer, pt was accepted back into his rehab facility, so she stated to cancel the OT order.

## 2022-06-25 NOTE — Unmapped (Signed)
[facility]  Emergency Department Provider Note     ED Clinical Impression     Final diagnoses:   Neck pain (Primary)      HPI, Medical Decision Making, ED Course     HPI: 71 y.o. male who has a past medical history of Bronchiectasis (CMS-HCC), Encounter for monitoring Suboxone maintenance therapy, Pneumonia of left lower lobe due to Pseudomonas species (CMS-HCC), and Pulmonary emphysema (CMS-HCC). who presents with neck pain.  Patient states that back in the 1990s he had a vertebral fusion done of the cervical spine.  He states since then he has been having intermittent neck pain.  He states that most recently, while he was admitted in the hospital, he was in bed when he accidentally hit his head on the bed rail and since then has been having persistent neck pain.  Of note, patient was recently discharged on 06/20/2022 for bronchiectasis with acute exacerbation.  He denies any new numbness, tingling, fever, or weakness.  States he has been taking his gabapentin and Suboxone for pain control with only mild relief.    On reexamination, patient is saturating well on his home dose of 4 L nasal cannula.  Afebrile, not tachycardic, and normotensive.  On exam, patient is chronically ill-appearing but otherwise in no acute distress.  On lung exam, patient has coarse breath sounds bilaterally.  Abdomen soft, nontender, nondistended.  Moving all 4 extremities equally.  Normal strength and sensation in all 4 extremities.  Cranial nerves II through XII grossly intact.  Bilateral cervical paraspinal tenderness to palpation.  No midline spinal tenderness.    DDx/MDM: Initial differential includes but is not limited to muscle strain, radiculopathy, degenerative disc disease, amongst multiple other etiologies.  Given that patient has had similar neck pain in the past with no new numbness, tingling, or weakness, do not feel like he needs any additional imaging at this time.  Low suspicion for spinal cord injury given no new neurologic deficits.  Patient's pain is likely exacerbation of his chronic neck pain.    Diagnostic workup as below. Will treat patient with lidocaine patch and Toradol and reassess.  We will also plan to give him a spine referral for further management.    Orders Placed This Encounter   Procedures    Ambulatory referral to Spine Center    Occupational Therapy Eval and Treat    Physical Therapy Eval and Treat       ED Course  ED Course as of 06/25/22 2154   Wed Jun 25, 2022   0442 On reexamination, patient states his pain is improved after Toradol and lidocaine patch.  Discussed with him plan of discharge with spine referral.  Gave appropriate return precautions.  He understands and is agreeable to plan.       Discussion of Management with other Physicians, QHP, or Appropriate Source: None  Independent Interpretation of Studies: If applicable, documented in ED Course above.  External Records Reviewed: I have reviewed recent and relevant previous record, including: Inpatient notes - discharge summary from bronchiectasis with acute exacerbation  Escalation of Care, Consideration of Admission/Observation/Transfer: Admission not required. Appropriate for outpatient management.  Diagnostic tests considered but not performed:  Consider doing MRI or x-rays however patient did not have significant trauma to his neck or concerning neurologic findings on exam so low suspicion for traumatic injury    ____________________________________________    The case was discussed with the attending physician, who is in agreement with the above assessment and plan.  Additional History Elements     Chief Complaint  Chief Complaint   Patient presents with    Medical Problem         Past Medical History:   Diagnosis Date    Bronchiectasis (CMS-HCC)     Encounter for monitoring Suboxone maintenance therapy     Pneumonia of left lower lobe due to Pseudomonas species (CMS-HCC)     Pulmonary emphysema (CMS-HCC)        Past Surgical History: Procedure Laterality Date    PR COLONOSCOPY W/BIOPSY SINGLE/MULTIPLE  08/01/2020    Procedure: COLONOSCOPY, FLEXIBLE, PROXIMAL TO SPLENIC FLEXURE; WITH BIOPSY, SINGLE OR MULTIPLE;  Surgeon: Kela Millin, MD;  Location: [facility];  Service: Gastroenterology    PR COLSC FLX W/RMVL OF TUMOR POLYP LESION SNARE TQ N/A 08/01/2020    Procedure: COLONOSCOPY FLEX; W/REMOV TUMOR/LES BY SNARE;  Surgeon: Kela Millin, MD;  Location: [facility];  Service: Gastroenterology    PR COLSC FLX WITH DIRECTED SUBMUCOSAL NJX ANY SBST N/A 08/01/2020    Procedure: COLONOSCOPY, FLEXIBLE, PROXIMAL TO SPLENIC FLEXURE; WITH DIRECTED SUBMUCOSAL INJECTION(S), ANY SUBSTANCE;  Surgeon: Kela Millin, MD;  Location: [facility];  Service: Gastroenterology       No current facility-administered medications for this encounter.    Current Outpatient Medications:     albuterol HFA 90 mcg/actuation inhaler, Inhale 2 puffs every four (4) hours as needed for wheezing or shortness of breath., Disp: 8 g, Rfl: 12    arformoteroL (BROVANA) 15 mcg/2 mL, Inhale 2 mL (15 mcg total) by nebulization two (2) times a day., Disp: 120 mL, Rfl: 0    aspirin 325 MG tablet, Take 2 tablets (650 mg total) by mouth daily as needed (headache)., Disp: , Rfl:     azithromycin (ZITHROMAX) 250 MG tablet, Take 1 tablet (250 mg total) by mouth daily., Disp: 90 tablet, Rfl: 3    budesonide (PULMICORT) 0.5 mg/2 mL nebulizer solution, Inhale 2 mL (0.5 mg total) by nebulization in the morning., Disp: 60 mL, Rfl: 11    buprenorphine-naloxone (SUBOXONE) 8-2 mg sublingual tablet, Place 1 tablet (8 mg of buprenorphine total) under the tongue Three (3) times a day for 5 days., Disp: 15 tablet, Rfl: 0    famotidine (PEPCID) 20 MG tablet, Take 1 tablet (20 mg total) by mouth Two (2) times a day., Disp: 60 tablet, Rfl: 0    ferrous sulfate 325 (65 FE) MG tablet, Take 1 tablet (325 mg total) by mouth every other day., Disp: 15 tablet, Rfl: 11    gabapentin (NEURONTIN) 300 MG capsule, Take 3 capsules (900 mg total) by mouth Three (3) times a day., Disp: , Rfl:     ipratropium-albuteroL (DUO-NEB) 0.5-2.5 mg/3 mL nebulizer, Inhale 6 mL by nebulization in the morning and 6 mL at noon and 6 mL in the evening., Disp: , Rfl: 0    levoFLOXacin (LEVAQUIN) 750 MG tablet, Take 1 tablet (750 mg total) by mouth daily., Disp: 1 tablet, Rfl: 0    mucus clearing device (AEROBIKA OSCILLATING PEP SYSTM) Devi, 1 Dose by Miscellaneous route two (2) times a day., Disp: 1 each, Rfl: 0    naproxen (NAPROSYN) 500 MG tablet, Take 1 tablet (500 mg total) by mouth in the morning and 1 tablet (500 mg total) in the evening. Take with meals. Do all this for 7 days., Disp: 14 tablet, Rfl: 0    sodium chloride 10 % Nebu, Inhale 5 mL by nebulization  two (2) times a day. Discard remaining solution. Use a new vial for each dose., Disp: 300 mL, Rfl: 0    tobramycin 300 mg/4 mL Nebu, Inhale the contents of 1 vial (300 mg) Two (2) times a day. Alternate 28 days on, 28 days off., Disp: 224 mL, Rfl: 11    Allergies  Patient has no known allergies.    Family History  Family History   Problem Relation Age of Onset    Hypertension Mother        Social History  Social History     Tobacco Use    Smoking status: Former     Packs/day: 1.00     Years: 50.00     Pack years: 50.00     Types: Cigarettes     Start date: 11/23/1967     Quit date: 12/09/2019     Years since quitting: 2.5    Smokeless tobacco: Never   Substance Use Topics    Alcohol use: Not Currently        Physical Exam     VITAL SIGNS:      Vitals:    06/24/22 1937 06/24/22 2335 06/25/22 0318 06/25/22 1313   BP:  108/52 112/64    Pulse:  88 84    Resp:  17 16    Temp: 36.4 ??C (97.5 ??F)   36.3 ??C (97.4 ??F)   TempSrc: Oral   Oral   SpO2:  97% 96%    Weight:       Height:           Constitutional: Alert and oriented.  Chronically ill-appearing.  No acute distress.  Eyes: Conjunctivae are normal.  HEENT: Normocephalic and atraumatic. Conjunctivae clear. No congestion. Moist mucous membranes.   Cardiovascular: Rate as above, regular rhythm. Normal and symmetric distal pulses. Brisk capillary refill. Normal skin turgor.  Respiratory: Normal respiratory effort.  Saturating well on his normal 4 L nasal cannula.  Coarse breath sounds bilaterally.  Gastrointestinal: Soft, non-distended, non-tender.  Genitourinary: Deferred.  Musculoskeletal: Non-tender with normal range of motion in all extremities.  No significant lower extremity edema.  Bilateral cervical paraspinal tenderness to palpation without any midline tenderness.  Neurologic: Normal speech and language. No gross focal neurologic deficits are appreciated. Patient is moving all extremities equally, face is symmetric at rest and with speech.  Normal strength and sensation in all 4 extremities.  Skin: Skin is warm, dry and intact. No rash noted.  Psychiatric: Mood and affect are normal. Speech and behavior are normal.     Radiology     No orders to display       Pertinent labs & imaging results that were available during my care of the patient were independently interpreted by me and considered in my medical decision making (see chart for details).    Portions of this record have been created using Scientist, clinical (histocompatibility and immunogenetics). Dictation errors have been sought, but may not have been identified and corrected.        Francis Dowse, MD  Resident  06/25/22 [phone]

## 2022-06-25 NOTE — Unmapped (Signed)
Called [facility]. Report given to Harriett Sine, Environmental education officer. She reports that there is no transport for patient. The transport agency would have to be called after day shift and their ETA is unknown and not guaranteed.

## 2022-06-25 NOTE — Unmapped (Signed)
Patient BIBOCEMS for neck pain. Patient has known dx of pneumonia, currently on abx. EMS reports nurse came to patient's room and called EMS saying he needed to be seen in ED for his pneumonia. Patient denies SOB, CP. Reports of neck pain.

## 2022-06-27 MED ORDER — NEBULIZERS
11 refills | 0 days
Start: 2022-06-27 — End: ?

## 2022-06-30 MED FILL — LC PLUS MISC: 56 days supply | Qty: 2 | Fill #0

## 2022-06-30 MED FILL — ALBUTEROL SULFATE HFA 90 MCG/ACTUATION AEROSOL INHALER: RESPIRATORY_TRACT | 17 days supply | Qty: 8.5 | Fill #1

## 2022-07-03 ENCOUNTER — Ambulatory Visit
Admit: 2022-07-03 | Discharge: 2022-07-10 | Disposition: A | Discharge status: Hospice | Payer: MEDICARE | Admitting: Family Medicine

## 2022-07-03 ENCOUNTER — Ambulatory Visit: Admit: 2022-07-03 | Payer: MEDICARE

## 2022-07-03 LAB — B-TYPE NATRIURETIC PEPTIDE: B-TYPE NATRIURETIC PEPTIDE: 30.41 pg/mL (ref <=100)

## 2022-07-03 LAB — CBC W/ AUTO DIFF
BASOPHILS ABSOLUTE COUNT: 0 10*9/L (ref 0.0–0.1)
BASOPHILS RELATIVE PERCENT: 0.4 %
EOSINOPHILS ABSOLUTE COUNT: 0.4 10*9/L (ref 0.0–0.5)
EOSINOPHILS RELATIVE PERCENT: 3.7 %
HEMATOCRIT: 28.7 % — ABNORMAL LOW (ref 39.0–48.0)
HEMOGLOBIN: 9.3 g/dL — ABNORMAL LOW (ref 12.9–16.5)
LYMPHOCYTES ABSOLUTE COUNT: 0.9 10*9/L — ABNORMAL LOW (ref 1.1–3.6)
LYMPHOCYTES RELATIVE PERCENT: 9 %
MEAN CORPUSCULAR HEMOGLOBIN CONC: 32.4 g/dL (ref 32.0–36.0)
MEAN CORPUSCULAR HEMOGLOBIN: 27.8 pg (ref 25.9–32.4)
MEAN CORPUSCULAR VOLUME: 85.6 fL (ref 77.6–95.7)
MEAN PLATELET VOLUME: 7.2 fL (ref 6.8–10.7)
MONOCYTES ABSOLUTE COUNT: 0.7 10*9/L (ref 0.3–0.8)
MONOCYTES RELATIVE PERCENT: 7.2 %
NEUTROPHILS ABSOLUTE COUNT: 8.1 10*9/L — ABNORMAL HIGH (ref 1.8–7.8)
NEUTROPHILS RELATIVE PERCENT: 79.7 %
NUCLEATED RED BLOOD CELLS: 0 /100{WBCs} (ref <=4)
PLATELET COUNT: 371 10*9/L (ref 150–450)
RED BLOOD CELL COUNT: 3.35 10*12/L — ABNORMAL LOW (ref 4.26–5.60)
RED CELL DISTRIBUTION WIDTH: 15.3 % — ABNORMAL HIGH (ref 12.2–15.2)
WBC ADJUSTED: 10.2 10*9/L (ref 3.6–11.2)

## 2022-07-03 LAB — BLOOD GAS, VENOUS
BASE EXCESS VENOUS: 10.1 — ABNORMAL HIGH (ref -2.0–2.0)
HCO3 VENOUS: 30 mmol/L — ABNORMAL HIGH (ref 22–27)
O2 SATURATION VENOUS: 19.4 % — ABNORMAL LOW (ref 40.0–85.0)
PCO2 VENOUS: 69 mmHg (ref 40–60)
PH VENOUS: 7.33 (ref 7.32–7.43)
PO2 VENOUS: 17 mmHg — ABNORMAL LOW (ref 35–40)

## 2022-07-03 LAB — COMPREHENSIVE METABOLIC PANEL
ALBUMIN: 2.6 g/dL — ABNORMAL LOW (ref 3.4–5.0)
ALKALINE PHOSPHATASE: 104 U/L (ref 46–116)
ALT (SGPT): <7 U/L — ABNORMAL LOW (ref 10–49)
ANION GAP: 5 mmol/L (ref 5–14)
AST (SGOT): 12 U/L (ref <=34)
BILIRUBIN TOTAL: 0.2 mg/dL — ABNORMAL LOW (ref 0.3–1.2)
BLOOD UREA NITROGEN: 16 mg/dL (ref 9–23)
BUN / CREAT RATIO: 19
CALCIUM: 9.3 mg/dL (ref 8.7–10.4)
CHLORIDE: 101 mmol/L (ref 98–107)
CO2: 34.2 mmol/L — ABNORMAL HIGH (ref 20.0–31.0)
CREATININE: 0.83 mg/dL
EGFR CKD-EPI (2021) MALE: >90 mL/min/{1.73_m2} (ref >=60)
GLUCOSE RANDOM: 107 mg/dL (ref 70–179)
POTASSIUM: 4.1 mmol/L (ref 3.4–4.8)
PROTEIN TOTAL: 7.5 g/dL (ref 5.7–8.2)
SODIUM: 140 mmol/L (ref 135–145)

## 2022-07-03 LAB — HIGH SENSITIVITY TROPONIN I - SERIAL: HIGH SENSITIVITY TROPONIN I: 5 ng/L (ref <=53)

## 2022-07-03 LAB — MAGNESIUM: MAGNESIUM: 2.1 mg/dL (ref 1.6–2.6)

## 2022-07-03 LAB — HIGH SENSITIVITY TROPONIN I - 2 HOUR SERIAL
HIGH SENSITIVITY TROPONIN - DELTA (0-2H): 1 ng/L (ref <=7)
HIGH-SENSITIVITY TROPONIN I - 2 HOUR: 4 ng/L (ref <=53)

## 2022-07-03 MED ADMIN — ipratropium (ATROVENT) 0.02 % nebulizer solution 500 mcg: 500 ug | RESPIRATORY_TRACT | @ 23:00:00 | Stop: 2022-07-03

## 2022-07-03 MED ADMIN — albuterol 2.5 mg /3 mL (0.083 %) nebulizer solution 5 mg: 5 mg | RESPIRATORY_TRACT | @ 23:00:00 | Stop: 2022-07-03

## 2022-07-04 LAB — BASIC METABOLIC PANEL
ANION GAP: 9 mmol/L (ref 5–14)
BLOOD UREA NITROGEN: 12 mg/dL (ref 9–23)
BUN / CREAT RATIO: 17
CALCIUM: 9.5 mg/dL (ref 8.7–10.4)
CHLORIDE: 100 mmol/L (ref 98–107)
CO2: 28.9 mmol/L (ref 20.0–31.0)
CREATININE: 0.71 mg/dL
EGFR CKD-EPI (2021) MALE: >90 mL/min/{1.73_m2} (ref >=60)
GLUCOSE RANDOM: 151 mg/dL (ref 70–179)
POTASSIUM: 3.9 mmol/L (ref 3.4–4.8)
SODIUM: 138 mmol/L (ref 135–145)

## 2022-07-04 LAB — MAGNESIUM: MAGNESIUM: 2.1 mg/dL (ref 1.6–2.6)

## 2022-07-04 LAB — BLOOD GAS, VENOUS
BASE EXCESS VENOUS: 6.5 — ABNORMAL HIGH (ref -2.0–2.0)
HCO3 VENOUS: 30 mmol/L — ABNORMAL HIGH (ref 22–27)
O2 SATURATION VENOUS: 92.7 % — ABNORMAL HIGH (ref 40.0–85.0)
PCO2 VENOUS: 50 mmHg (ref 40–60)
PH VENOUS: 7.41 (ref 7.32–7.43)
PO2 VENOUS: 64 mmHg — ABNORMAL HIGH (ref 35–40)

## 2022-07-04 LAB — HIGH SENSITIVITY TROPONIN I - 6 HOUR SERIAL
HIGH SENSITIVITY TROPONIN - DELTA (2-6H): 1 ng/L (ref <=7)
HIGH-SENSITIVITY TROPONIN I - 6 HOUR: 3 ng/L (ref <=53)

## 2022-07-04 LAB — CBC
HEMATOCRIT: 27.5 % — ABNORMAL LOW (ref 39.0–48.0)
HEMOGLOBIN: 8.8 g/dL — ABNORMAL LOW (ref 12.9–16.5)
MEAN CORPUSCULAR HEMOGLOBIN CONC: 32 g/dL (ref 32.0–36.0)
MEAN CORPUSCULAR HEMOGLOBIN: 27.2 pg (ref 25.9–32.4)
MEAN CORPUSCULAR VOLUME: 84.8 fL (ref 77.6–95.7)
MEAN PLATELET VOLUME: 7.3 fL (ref 6.8–10.7)
PLATELET COUNT: 364 10*9/L (ref 150–450)
RED BLOOD CELL COUNT: 3.25 10*12/L — ABNORMAL LOW (ref 4.26–5.60)
RED CELL DISTRIBUTION WIDTH: 15.1 % (ref 12.2–15.2)
WBC ADJUSTED: 12.3 10*9/L — ABNORMAL HIGH (ref 3.6–11.2)

## 2022-07-04 MED ADMIN — sodium chloride 3 % NEBULIZER solution 4 mL: 4 mL | RESPIRATORY_TRACT | @ 14:00:00 | Stop: 2022-07-04

## 2022-07-04 MED ADMIN — enoxaparin (LOVENOX) syringe 40 mg: 40 mg | SUBCUTANEOUS | @ 05:00:00

## 2022-07-04 MED ADMIN — budesonide (PULMICORT) nebulizer solution 0.5 mg: .5 mg | RESPIRATORY_TRACT | @ 14:00:00

## 2022-07-04 MED ADMIN — methylPREDNISolone sodium succinate (PF) (SOLU-medrol) injection 125 mg: 125 mg | INTRAVENOUS | @ 01:00:00 | Stop: 2022-07-03

## 2022-07-04 MED ADMIN — cefepime (MAXIPIME) 2 g in sodium chloride 0.9 % (NS) 100 mL IVPB-MBP: 2 g | INTRAVENOUS | @ 18:00:00 | Stop: 2022-07-09

## 2022-07-04 MED ADMIN — ipratropium-albuteroL (DUO-NEB) 0.5-2.5 mg/3 mL nebulizer solution 3 mL: 3 mL | RESPIRATORY_TRACT | @ 05:00:00

## 2022-07-04 MED ADMIN — levoFLOXacin (LEVAQUIN) 750 mg/150 mL IVPB 750 mg: 750 mg | INTRAVENOUS | @ 02:00:00 | Stop: 2022-07-03

## 2022-07-04 MED ADMIN — gabapentin (NEURONTIN) capsule 900 mg: 900 mg | ORAL | @ 18:00:00

## 2022-07-04 MED ADMIN — ipratropium-albuteroL (DUO-NEB) 0.5-2.5 mg/3 mL nebulizer solution 3 mL: 3 mL | RESPIRATORY_TRACT | @ 08:00:00

## 2022-07-04 MED ADMIN — buprenorphine-naloxone (SUBOXONE) 8-2 mg SL tablet 8 mg of buprenorphine: 1 | SUBLINGUAL | @ 18:00:00

## 2022-07-04 MED ADMIN — ipratropium-albuteroL (DUO-NEB) 0.5-2.5 mg/3 mL nebulizer solution 3 mL: 3 mL | RESPIRATORY_TRACT | @ 02:00:00 | Stop: 2022-07-03

## 2022-07-04 MED ADMIN — ipratropium-albuteroL (DUO-NEB) 0.5-2.5 mg/3 mL nebulizer solution 3 mL: 3 mL | RESPIRATORY_TRACT | @ 14:00:00

## 2022-07-04 MED ADMIN — buprenorphine-naloxone (SUBOXONE) 8-2 mg SL tablet 8 mg of buprenorphine: 1 | SUBLINGUAL | @ 10:00:00

## 2022-07-04 MED ADMIN — tobramycin (PF) (TOBI) 300 mg/5 mL nebulizer solution 300 mg: 300 mg | RESPIRATORY_TRACT | @ 05:00:00

## 2022-07-04 MED ADMIN — ipratropium-albuteroL (DUO-NEB) 0.5-2.5 mg/3 mL nebulizer solution 3 mL: 3 mL | RESPIRATORY_TRACT | @ 01:00:00 | Stop: 2022-07-03

## 2022-07-04 MED ADMIN — tobramycin (PF) (TOBI) 300 mg/5 mL nebulizer solution 300 mg: 300 mg | RESPIRATORY_TRACT | @ 14:00:00

## 2022-07-04 MED ADMIN — lidocaine (LIDODERM) 5 % patch 1 patch: 1 | TRANSDERMAL | @ 12:00:00

## 2022-07-04 MED ADMIN — gabapentin (NEURONTIN) capsule 900 mg: 900 mg | ORAL | @ 12:00:00

## 2022-07-04 MED ADMIN — buprenorphine-naloxone (SUBOXONE) 8-2 mg SL tablet 8 mg of buprenorphine: 1 | SUBLINGUAL | @ 05:00:00

## 2022-07-04 MED ADMIN — ferrous sulfate tablet 325 mg: 325 mg | ORAL | @ 12:00:00

## 2022-07-04 MED ADMIN — gabapentin (NEURONTIN) capsule 900 mg: 900 mg | ORAL | @ 05:00:00

## 2022-07-04 MED ADMIN — ipratropium-albuteroL (DUO-NEB) 0.5-2.5 mg/3 mL nebulizer solution 3 mL: 3 mL | RESPIRATORY_TRACT | @ 20:00:00

## 2022-07-04 MED ADMIN — predniSONE (DELTASONE) tablet 40 mg: 40 mg | ORAL | @ 12:00:00 | Stop: 2022-07-08

## 2022-07-04 NOTE — Unmapped (Signed)
General Pulmonary Team Initial Consult Note     Date of Service: 07/04/2022  Requesting Physician: Doran Durand, MD   Requesting Service: Family Medicine [facility])  Reason for consultation: Comprehensive evaluation of  COPD vs Bronchiectasis exacerbation .    Hospital Problems:  Principal Problem:    COPD exacerbation (CMS-HCC)  Active Problems:    Stage 4 very severe COPD by GOLD classification (CMS-HCC)    Pulmonary nodule    Bronchiectasis with acute exacerbation (CMS-HCC)    Chronic pain due to trauma    Recurrent pneumonia    Dyspnea and respiratory abnormalities    History of MDR Pseudomonas aeruginosa infection    History of smoking greater than 50 pack years    Buprenorphine dependence (CMS-HCC)      HPI: Venicio Kriner is a 71 y.o. male with a past medical history significant pertinent for GOLD 4 Group E COPD (last PFTs 07/27/20) on 4 L [place] at home, bronchiectasis complicated by multiple prior exacerbations (last discharged 06/20/22 from [facility]) with prior cultures growing multi-drug-resistant PsA and MDR Achromobacter, prior pulmonary hypertension by echocardiography (2021), history of tobacco use disorder (quit 2-3 years ago, 50+ pk-yrs), opioid use disorder, who presented 7/27 with dyspnea.     Problems addressed during this consult include acute hypercapneic respiratory failure acute hypoxic respiratory failure acute on chronic respiratory failure bronchiectasis COPD exacerbation history of MDR Pseudomonas infection, tobacco use disorder  . Based on these problems, the patient has moderate risk of morbidity/mortality which is commensurate w their risk of management options described below in the recommendations.    Assessment      Interval Events:  Initial visit       Impression:  I personally reviewed most recent pertinent labs, imaging and micro data, from 07/04/2022, which are noted in recommendations..        Recommendations     #Acute COPD Exacerbation vs Bronchiectasis Exacerbation 2/2 superimposed LLL pneumonia   #GOLD Stage E COPD  #Bronchiectasis c/b history of MDR PsA and MDR Achromobacter   Patient has had multiple hospitalizations for COPD vs Bronchiectasis exacerbations, with his most recent from 7/5-7/14. Other possible causes of his acute on chronic hypercapnic hypoxic respiratory failure including CHF, sepsis, PE, and ACS are less likely given physical exam, laboratory, and radiologic findings. CXR consistent with LLL pneumonia. Patient was restarted on Levofloxacin given prior susceptibility. Repeat LRCx sent, RPP negative. VBG on admission 7.33/69/17/30-->7.41/50/64/30 on 3-4 L [place]. WBC 12.3 on prednisone.    -Continue Levaquin for 10 days, adjust antibiotic regimen based on resulting LRCx  -Recommend Cefepime 2 g Q8H  -Continue Pulmicort 0.5 mg BID  -Continue DuoNeb Q6H, Albuterol Q4H PRN  -Continue prednisone 40 mg (7/28-7/31) for lower airway congestion  -Continue airway clearance with Duonebs, hypertonic saline 10%, and Aerobika 4 times daily   -Wean oxygen via [place] to goal SpO2 88-92%  -Hold inhaled Tobramycin while in acute bronchiectasis exacerbation and in setting of double GNR coverage. Plan to restart upon discharge for 28 day course    This patient was seen and evaluated with Dr. Marcos Eke . Please do not hesitate to page [phone] [facility] consult) with questions. We appreciate the opportunity to assist in the care of this patient. We look forward to following with you.    Barnet Glasgow, MD    Subjective & Objective     Vitals - past 24 hours  Temp:  [36.4 ??C (97.5 ??F)-36.8 ??C (98.2 ??F)] 36.6 ??C (97.8 ??F)  Heart Rate:  [  101-131] 117  SpO2 Pulse:  [117-128] 117  Resp:  [15-30] 17  BP: (98-109)/(51-70) 104/58  SpO2:  [93 %-99 %] 94 % Intake/Output  I/O last 3 completed shifts:  In: 150 [IV Piggyback:150]  Out: 1500 [Urine:1500]      Pertinent exam findings:   General appearance - oriented to person, place, and time, normal appearing weight, playful, active, chronically ill appearing, and well appearing, well nourished, and in no distress  Eyes - EOMI, PERRLA, anicteric sclerae, pink conjunctiva  Mouth - mucous membranes moist, pharynx normal without lesions and moist mucous membranes, no pharyngeal erythema or exudates  Neck - Trachea midline, normal neck movement  Lymphatics - not examined  Heart - regular rate and rhythm, normal S1/S2, no gallops, rubs, or murmurs  Chest - rhonchi noted diffusely  Abdomen - soft, nontender, nondistended, no masses or organomegaly  Extremities - No lower extremity edema, no clubbing or cyanosis  Skin - normal coloration and turgor, no rashes, no suspicious skin lesions noted  Neurological - alert, oriented, normal speech, no focal findings or movement disorder noted    Relevant Imaging:  - CXR on 07/03/22 revealed increased opacity in the mid to lower left lung superimposed on chronic parenchymal changes, raising suspicion for acute pneumonia.   - CT chest without contrast on 05/16/22 revealed stable multifocal consolidation related to prior episodes of infection or aspiration with no acute consolidation. Fixed stricture bronchus intermedius without change. There are secretions at this level. This may be related to prior infection or aspiration. Significant coronary artery and aortic valve calcification. Correlate cardiac echo as indicated to assess for possible aortic stenosis. Extensive centrilobular and paraseptal emphysema.    Arterial Blood Gas:   No results in the last day     Venous Blood Gas:   Recent Labs   Lab Units 07/04/22  0547 07/03/22  2107   PH VEN  7.41 7.33   PCO2 VEN mm Hg 50 69*   PO2 VEN mm Hg 64* 17*   HCO3 VEN mmol/L 30* 30*   BASE EXC VEN  6.5* 10.1*   O2 SAT VEN % 92.7* 19.4*        Cultures:  Lower Respiratory Culture (no units)   Date Value   06/15/2022 2+ Achromobacter species (A)   06/15/2022 3+ Oropharyngeal Flora Isolated     WBC (10*9/L)   Date Value   07/04/2022 12.3 (H)          Other Labs:  Lab Results   Component Value Date    WBC 12.3 (H) 07/04/2022    HGB 8.8 (L) 07/04/2022    HCT 27.5 (L) 07/04/2022    PLT 364 07/04/2022     Lab Results   Component Value Date    NA 138 07/04/2022    K 3.9 07/04/2022    CL 100 07/04/2022    CO2 28.9 07/04/2022    BUN 12 07/04/2022    CREATININE 0.71 07/04/2022    GLU 151 07/04/2022    CALCIUM 9.5 07/04/2022    MG 2.1 07/04/2022    PHOS 2.5 05/07/2022     Lab Results   Component Value Date    BILITOT 0.2 (L) 07/03/2022    BILIDIR <0.10 11/03/2021    PROT 7.5 07/03/2022    ALBUMIN 2.6 (L) 07/03/2022    ALT <7 (L) 07/03/2022    AST 12 07/03/2022    ALKPHOS 104 07/03/2022     Lab Results   Component Value Date  INR 1.20 05/16/2021    APTT 31.3 05/05/2021       Allergies & Home Medications   Personally reviewed in Epic    Continuous Infusions:       Scheduled Medications:    budesonide  0.5 mg Nebulization BID (RT)    buprenorphine-naloxone  1 tablet Sublingual Q8H SCH    enoxaparin (LOVENOX) injection  40 mg Subcutaneous Nightly    ferrous sulfate  325 mg Oral Every other day    gabapentin  900 mg Oral TID    ipratropium-albuteroL  3 mL Nebulization Q6H (RT)    levoFLOXacin  750 mg Intravenous Q24H    lidocaine  1 patch Transdermal Daily    predniSONE  40 mg Oral Daily    sodium chloride  5 mL Nebulization QID    tobramycin (PF)  300 mg Nebulization BID (RT)       PRN medications:  albuterol    Charlyn Minerva, MD  Internal Medicine Resident, PGY-2

## 2022-07-04 NOTE — Unmapped (Signed)
[facility]  Emergency Department Provider Note        ED Clinical Impression      Final diagnoses:   COPD exacerbation (CMS-HCC) (Primary)   Pneumonia of left lower lobe due to infectious organism            HPI, Medical Decision Making & Impression, Progress Notes, and Critical Care      Impression, Differential Diagnosis and Plan of Care    July 03, 2022 7:45 PM   Kristopher Obrien is a 71 y.o. male with a past medical history of COPD with bronchiectasis and pulmonary emphysema who presents to the ED via EMS for evaluation of shortness of breath. The patient reports shortness of breath with a green, chunky productive cough that began earlier this afternoon around 2 PM. Since then his symptoms have continued to worsen. He stays at the [facility], and states that when he first began having these symptoms he was unable to get help from the facility to call EMS. This evening, he was able to contact EMS with help from his family and received a breathing treatment en route to the ED. He received a second treatment after arrival, and says these have helped mildly. He is on 4L O2 at baseline, recently increased from 3L PRN. He denies having chest pain.  Otherwise denies any fevers, nausea or vomiting, abdominal pain, chest pain.    Per chart review, the patient has been seen frequently in the ED and admitted for COPD exacerbation (most recently admitted on 06/11/2022 for 10 days). He was treated for bronchiectasis exacerbation with steroids, BiPAP, and a 5 day prednisone course. Additionally he was given a 10 day course of Levaquin for MDR pseudomonas. Prior to discharge, His COPD regimen was optimized with arformoterol, budesonide, and duonebs which he planned to continue on his own.    Outside Historian(s)  (EMS, Significant Other, Family, Parent, Caregiver, Friend, Law Enforcement, etc.)  None.    External Records Reviewed  (Inpatient/Outpatient notes, Prior labs/imaging studies, Care Everywhere, PDMP, External ED notes, etc)  06/11/2022  ED-Admission Note: Patient was admitted for bronchiectasis exacerbation where he was also treated for a pseudomonal infection. He was discharged with an updated COPD regimen and O2 requirements.       BP 106/62  - Pulse 101  - Resp 21  - SpO2 93%     Patient's initial vital signs showing normal blood pressure, heart rate slightly tachycardic to 101 BPM, afebrile, saturating 93% on 4L O2 with no signs of any respiratory distress.    On initial examination, patient is alert and oriented x4.  Cachectic, chronically ill-appearing.  Patient is answering all questions appropriately.   Patient's cardiopulmonary exam is generally within normal limits.  Heart rate is normal, no murmurs appreciated.  Course breath sounds in the bilateral lung fields.  Audible wheezing.  Abdominal exam shows no tenderness to palpation, rebound, guarding or peritoneal signs.  Abdomen is soft and nondistended.  Extremity exam is benign and shows no signs of any edema.      Assessment and Plan: Differential includes pneumonia, atypical ACS, CHF exacerbation, asthma/COPD, COVID-19 or other viral upper respiratory illness, pneumonitis, pulmonary embolism.  Plan for labs including CBC, CMP, magnesium, serial troponins, proBNP, chest x-ray and close monitoring.  His presentation is most consistent with a COPD exacerbation, possible overlying pneumonia.  Will treat with continuous DuoNeb x3, Solu-Medrol, and IV antibiotics.  Plan for admission afterwards.  Should patient decompensate, will consider  BiPAP initiation.  He currently appears to be slightly tachypneic, though in no acute respiratory distress.  We will reevaluate should he decompensate.    Diagnostic and treatment orders as below.    Orders Placed This Encounter   Procedures   ??? XR Chest 2 views   ??? CBC w/ Differential   ??? Comprehensive Metabolic Panel   ??? Magnesium   ??? B-type natriuretic peptide (BNP)   ??? hsTroponin I (serial 0-2-6H w/ delta)   ??? Blood Gas, Venous   ??? hsTroponin I - 2 Hour   ??? hsTroponin I - 6 Hour   ??? Adult Oxygen therapy   ??? ECG 12 Lead   ??? ED Admit Decision       Medications   levoFLOXacin (LEVAQUIN) 750 mg/150 mL IVPB 750 mg (750 mg Intravenous New Bag 07/03/22 2134)   albuterol 2.5 mg /3 mL (0.083 %) nebulizer solution 5 mg (5 mg Nebulization Given 07/03/22 1927)   ipratropium (ATROVENT) 0.02 % nebulizer solution 500 mcg (500 mcg Nebulization Given 07/03/22 1927)   methylPREDNISolone sodium succinate (PF) (SOLU-medrol) injection 125 mg (125 mg Intravenous Given 07/03/22 2127)   ipratropium-albuteroL (DUO-NEB) 0.5-2.5 mg/3 mL nebulizer solution 3 mL (3 mL Nebulization Given 07/03/22 2129)     Followed by   ipratropium-albuteroL (DUO-NEB) 0.5-2.5 mg/3 mL nebulizer solution 3 mL (3 mL Nebulization Given 07/03/22 2152)     Followed by   ipratropium-albuteroL (DUO-NEB) 0.5-2.5 mg/3 mL nebulizer solution 3 mL (3 mL Nebulization Given 07/03/22 2156)         Any discussions with consultants that are involved in the patient's care, individual interpretations of any studies that were obtained, considerations regarding prescriptions, further escalation of care including admission, and additional progress notes are all documented in the ED course below.    ED Course as of 07/03/22 2233   Thu Jul 03, 2022   2012 EKG showing sinus tachycardia at a rate of 103 bpm in the setting of albuterol via EMS.  Normal PR, QRS and QTc intervals.  No acute ST elevations appreciated.   2225 Xray showing:  IMPRESSION:  Increased opacity in the mid to lower left lung superimposed on chronic parenchymal changes, raising suspicion for acute pneumonia.   2230 Spoke with medicine team, they will come see the patient.    On reassessment, patient continues to have significant coarse breath sounds with expiratory wheezing.         Portions of this record have been created using [employer], clinical (histocompatibility and immunogenetics). Dictation errors have been sought, but may not have been identified and corrected.    The case was discussed with Dr. Jonetta Speak, MD who is in agreement with the above assessment and plan    See chart and resident provider documentation for details.    ____________________________________________         History      Reason for Visit  No chief complaint on file.          Past Medical History:   Diagnosis Date   ??? Bronchiectasis (CMS-HCC)    ??? Encounter for monitoring Suboxone maintenance therapy    ??? Pneumonia of left lower lobe due to Pseudomonas species (CMS-HCC)    ??? Pulmonary emphysema (CMS-HCC)        Patient Active Problem List   Diagnosis   ??? COPD exacerbation (CMS-HCC)   ??? Mediastinal lymphadenopathy   ??? Pulmonary nodule   ??? Bronchiectasis with acute exacerbation (CMS-HCC)   ??? Severe pneumonia   ???  Stage 4 very severe COPD by GOLD classification (CMS-HCC)   ??? Chronic pain due to trauma   ??? Pleuritic chest pain   ??? Recurrent pneumonia   ??? Dyspnea and respiratory abnormalities   ??? History of MDR Pseudomonas aeruginosa infection   ??? History of smoking greater than 50 pack years   ??? Buprenorphine dependence (CMS-HCC)       Past Surgical History:   Procedure Laterality Date   ??? PR COLONOSCOPY W/BIOPSY SINGLE/MULTIPLE  08/01/2020    Procedure: COLONOSCOPY, FLEXIBLE, PROXIMAL TO SPLENIC FLEXURE; WITH BIOPSY, SINGLE OR MULTIPLE;  Surgeon: Kela Millin, MD;  [facility];  Service: Gastroenterology   ??? PR COLSC FLX W/RMVL OF TUMOR POLYP LESION SNARE TQ N/A 08/01/2020    Procedure: COLONOSCOPY FLEX; W/REMOV TUMOR/LES BY SNARE;  Surgeon: Kela Millin, MD;  Location: [facility];  Service: Gastroenterology   ??? PR COLSC FLX WITH DIRECTED SUBMUCOSAL NJX ANY SBST N/A 08/01/2020    Procedure: COLONOSCOPY, FLEXIBLE, PROXIMAL TO SPLENIC FLEXURE; WITH DIRECTED SUBMUCOSAL INJECTION(S), ANY SUBSTANCE;  Surgeon: Kela Millin, MD;  [facility]: [facility];  Service: Gastroenterology         Current Facility-Administered Medications:   ???  levoFLOXacin (LEVAQUIN) 750 mg/150 mL IVPB 750 mg, 750 mg, Intravenous, Once, Melene Plan, MD, Last Rate: 100 mL/hr at 07/03/22 2134, 750 mg at 07/03/22 2134    Current Outpatient Medications:   ???  albuterol HFA 90 mcg/actuation inhaler, Inhale 2 puffs every four (4) hours as needed for wheezing or shortness of breath., Disp: 8 g, Rfl: 12  ???  arformoteroL (BROVANA) 15 mcg/2 mL, Inhale 2 mL (15 mcg total) by nebulization two (2) times a day., Disp: 120 mL, Rfl: 0  ???  aspirin 325 MG tablet, Take 2 tablets (650 mg total) by mouth daily as needed (headache)., Disp: , Rfl:   ???  azithromycin (ZITHROMAX) 250 MG tablet, Take 1 tablet (250 mg total) by mouth daily., Disp: 90 tablet, Rfl: 3  ???  budesonide (PULMICORT) 0.5 mg/2 mL nebulizer solution, Inhale 2 mL (0.5 mg total) by nebulization in the morning., Disp: 60 mL, Rfl: 11  ???  famotidine (PEPCID) 20 MG tablet, Take 1 tablet (20 mg total) by mouth Two (2) times a day., Disp: 60 tablet, Rfl: 0  ???  ferrous sulfate 325 (65 FE) MG tablet, Take 1 tablet (325 mg total) by mouth every other day., Disp: 15 tablet, Rfl: 11  ???  gabapentin (NEURONTIN) 300 MG capsule, Take 3 capsules (900 mg total) by mouth Three (3) times a day., Disp: , Rfl:   ???  ipratropium-albuteroL (DUO-NEB) 0.5-2.5 mg/3 mL nebulizer, Inhale 6 mL by nebulization in the morning and 6 mL at noon and 6 mL in the evening., Disp: , Rfl: 0  ???  levoFLOXacin (LEVAQUIN) 750 MG tablet, Take 1 tablet (750 mg total) by mouth daily., Disp: 1 tablet, Rfl: 0  ???  mucus clearing device (AEROBIKA OSCILLATING PEP SYSTM) Devi, 1 Dose by Miscellaneous route two (2) times a day., Disp: 1 each, Rfl: 0  ???  nebulizers Misc, Use as directed with inhaled medication, Disp: 2 each, Rfl: 11  ???  sodium chloride 10 % Nebu, Inhale 5 mL by nebulization two (2) times a day. Discard remaining solution. Use a new vial for each dose., Disp: 300 mL, Rfl: 0  ???  tobramycin 300 mg/4 mL Nebu, Inhale the contents of 1 vial (300 mg) via nebulizer Two (2) times  a day. Alternate 28 days on, 28 days off., Disp: 224 mL, Rfl: 11    Allergies  Patient has no known allergies.    Family History   Problem Relation Age of Onset   ??? Hypertension Mother        Social History  Social History     Tobacco Use   ??? Smoking status: Former     Packs/day: 1.00     Years: 50.00     Pack years: 50.00     Types: Cigarettes     Start date: 11/23/1967     Quit date: 12/09/2019     Years since quitting: 2.5   ??? Smokeless tobacco: Never   Substance Use Topics   ??? Alcohol use: Not Currently          Physical Exam     This provider entered the patient's room: Yes    ??? If this provider did enter the patient room, the following was PPE worn: Surgical mask, eye protection and gloves    Vitals:    07/03/22 1912   BP: 106/62   Pulse: 101   Resp: 21   SpO2: 93%       Constitutional: Alert and oriented.  Cachectic, chronically ill-appearing.  Eyes: Conjunctivae are normal.  ENT       Head: Normocephalic and atraumatic.       Nose: No congestion.       Mouth/Throat: Mucous membranes are moist.       Neck: No stridor.  Cardiovascular: Normal rate, regular rhythm. Normal and symmetric distal pulses are present in all extremities.  Respiratory: ourse breath sounds in the bilateral lung fields. Audible wheezing.  Gastrointestinal: Abdomen is generally soft, no obvious distention.  Genitourinary: Deferred.   Musculoskeletal: Normal range of motion in all extremities.  Neurologic: Normal speech and language. No gross focal neurologic deficits are appreciated.  Skin: Skin is warm, dry and intact. No rash noted over visible skin.  Psychiatric: Mood and affect are normal. Speech and behavior are normal.        Results (Radiology, Laboratory)     XR Chest 2 views   Final Result          Labs Reviewed   COMPREHENSIVE METABOLIC PANEL - Abnormal; Notable for the following components:       Result Value    CO2 34.2 (*)     Albumin 2.6 (*)     Total Bilirubin 0.2 (*) ALT <7 (*)     All other components within normal limits   BLOOD GAS, VENOUS - Abnormal; Notable for the following components:    pCO2, Ven 69 (*)     pO2, Ven 17 (*)     HCO3, Ven 30 (*)     Base Excess, Ven 10.1 (*)     O2 Saturation, Venous 19.4 (*)     All other components within normal limits   CBC W/ AUTO DIFF - Abnormal; Notable for the following components:    RBC 3.35 (*)     HGB 9.3 (*)     HCT 28.7 (*)     RDW 15.3 (*)     Absolute Neutrophils 8.1 (*)     Absolute Lymphocytes 0.9 (*)     All other components within normal limits   MAGNESIUM - Normal   B-TYPE NATRIURETIC PEPTIDE - Normal   HIGH SENSITIVITY TROPONIN I - SERIAL - Normal   CBC W/ DIFFERENTIAL    Narrative:  The following orders were created for panel order CBC w/ Differential.  Procedure                               Abnormality         Status                     ---------                               -----------         ------                     CBC w/ Differential[[id number]]         Abnormal            Final result                 Please view results for these tests on the individual orders.   HIGH SENSITIVITY TROPONIN I - 2 HOUR SERIAL       Pertinent labs & imaging results that were available during my care of the patient were reviewed by me and considered in my medical decision making (see chart for details).      Please note- This chart has been created using [employer]. Chart creation errors have been sought, but may not always be located and such creation errors, especially pronoun confusion, do NOT reflect on the standard of medical care.      Documentation assistance was provided by Nadene Rubins, Scribe, on July 03, 2022 at 7:45 PM for Melene Plan, MD.    Documentation assistance was provided by the scribe in my presence.  The documentation recorded by the scribe has been reviewed by me and accurately reflects the services I personally performed.       Melene Plan, MD  Resident  07/03/22 [phone]

## 2022-07-04 NOTE — Unmapped (Signed)
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  This is his third admission in the last 7 months for the same reason.    Past medical history: Severe COPD, bronchiectasis    Allergies: None reported          No current facility-administered medications on file prior to encounter.     Current Outpatient Medications on File Prior to Encounter   Medication Sig Dispense Refill   ??? albuterol HFA 90 mcg/actuation inhaler Inhale 2 puffs every four (4) hours as needed for wheezing or shortness of breath. 8 g 12   ??? arformoteroL (BROVANA) 15 mcg/2 mL Inhale 2 mL (15 mcg total) by nebulization two (2) times a day. 120 mL 0   ??? aspirin 325 MG tablet Take 2 tablets (650 mg total) by mouth daily as needed (headache).     ??? azithromycin (ZITHROMAX) 250 MG tablet Take 1 tablet (250 mg total) by mouth daily. 90 tablet 3   ??? budesonide (PULMICORT) 0.5 mg/2 mL nebulizer solution Inhale 2 mL (0.5 mg total) by nebulization in the morning. 60 mL 11   ??? [EXPIRED] buprenorphine-naloxone (SUBOXONE) 8-2 mg sublingual tablet Place 1 tablet (8 mg of buprenorphine total) under the tongue Three (3) times a day for 5 days. 15 tablet 0   ??? famotidine (PEPCID) 20 MG tablet Take 1 tablet (20 mg total) by mouth Two (2) times a day. 60 tablet 0   ??? ferrous sulfate 325 (65 FE) MG tablet Take 1 tablet (325 mg total) by mouth every other day. 15 tablet 11   ??? gabapentin (NEURONTIN) 300 MG capsule Take 3 capsules (900 mg total) by mouth Three (3) times a day.     ??? ipratropium-albuteroL (DUO-NEB) 0.5-2.5 mg/3 mL nebulizer Inhale 6 mL by nebulization in the morning and 6 mL at noon and 6 mL in the evening.  0   ??? levoFLOXacin (LEVAQUIN) 750 MG tablet Take 1 tablet (750 mg total) by mouth daily. 1 tablet 0   ??? mucus clearing device (AEROBIKA OSCILLATING PEP SYSTM) Devi 1 Dose by Miscellaneous route two (2) times a day. 1 each 0   ??? [EXPIRED] naproxen (NAPROSYN) 500 MG tablet Take 1 tablet (500 mg total) by mouth in the morning and 1 tablet (500 mg total) in the evening. Take with meals. Do all this for 7 days. 14 tablet 0   ??? nebulizers Misc Use as directed with inhaled medication 2 each 11   ??? sodium chloride 10 % Nebu Inhale 5 mL by nebulization two (2) times a day. Discard remaining solution. Use a new vial for each dose. 300 mL 0   ??? tobramycin 300 mg/4 mL Nebu Inhale the contents of 1 vial (300 mg) via nebulizer Two (2) times a day. Alternate 28 days on, 28 days off. 224 mL 11       BP 103/58  - Pulse 115  - Temp 36.8 ??C (98.2 ??F) (Oral)  - Resp 21  - Ht 177.8 cm (5' 10)  - Wt 61.1 kg (134 lb 9.6 oz)  - SpO2 99%  - BMI 19.31 kg/m??   Gen: Patient awake, alert, oriented to time place and person, no  pallor, icterus,  cyanosis or clubbing.  Eyes: Pupils equal round and reacting light bilaterally  Head neck ENT: No JVD, no lymphadenopathy-cervical or supraclavicular, no thyromegaly, moist mucosa, normal oropharynx  CVS: S1-S2 heard normally, no murmurs appreciated, no rubs or gallops  Respiratory: Air entry equal bilaterally without any crackles or wheeze  Abdomen: Soft, nontender, nondistended, no organomegaly, bowel sounds present  Neuro: Nonfocal neurological exam, no sensory deficits, cranial nerves grossly intact  Skin extremities: No rash, no pedal edema    Lab Results   Component Value Date    WBC 12.3 (H) 07/04/2022    HGB 8.8 (L) 07/04/2022    HCT 27.5 (L) 07/04/2022    PLT 364 07/04/2022       Lab Results   Component Value Date    NA 138 07/04/2022    K 3.9 07/04/2022    CL 100 07/04/2022    CO2 28.9 07/04/2022    BUN 12 07/04/2022    CREATININE 0.71 07/04/2022    GLU 151 07/04/2022    CALCIUM 9.5 07/04/2022    MG 2.1 07/04/2022    PHOS 2.5 05/07/2022       Lab Results   Component Value Date    BILITOT 0.2 (L) 07/03/2022    BILIDIR <0.10 11/03/2021    PROT 7.5 07/03/2022    ALBUMIN 2.6 (L) 07/03/2022    ALT <7 (L) 07/03/2022    AST 12 07/03/2022    ALKPHOS 104 07/03/2022       Lab Results   Component Value Date    INR 1.20 05/16/2021    APTT 31.3 05/05/2021       Lab Results   Component Value Date    IGG 1,381 09/04/2021       Blood gas:  pH 7.41, PCO2 50    PFT: FEV1 0.59, 20.2% predicted  FEV1/FVC: 26      Recommendations:    -Patient has chronic hypercapnic and hypoxemic respiratory failure secondary to severe COPD and bronchiectasis    -He will benefit from daytime usage of noninvasive open ventilation with life 2000    -He has tried BiPAP and has failed with ongoing recurrent admissions      This patient has severe chronic respiratory failure and COPD. BiPAP appears to have failed to adequately manage this patient's respiratory disease when used at home. I am ordering NIV therapy during hours of sleep as well as during waking hours when symptomatic because of its volume targeting AVAPS mode and faster responding AVAPS rate. The severity of this patient's disease is such that failure to provide noninvasive ventilator therapy on a daily basis will likely lead to future hospital re-admissions and life threatening situations.    I believe that he will benefit from daytime usage of noninvasive open ventilation to allow for better ability to perform his pulmonary rehab and reduce his exacerbations and admissions as well as hypercapnia.      Maia Plan, MD  Assistant Professor, Pulmonary and Critical Care  Pager: [phone]  July 04, 2022 10:22 AM

## 2022-07-04 NOTE — Unmapped (Signed)
Patient BIB [facility] with c/o SOB.

## 2022-07-04 NOTE — Unmapped (Signed)
Pt stepdown status. Aox4. ST, rates 115-120s, bp normotensive. Weaned to 3L [place], sats >92%.; [facility] pending. Adequate UOP, no bm this shift. Seen by WOCN and wound care completed. Bed locked and in lowest position, call bell within reach. Plan to continue respiratory interventions, IV abx.     Problem: Self-Care Deficit  Goal: Improved Ability to Complete Activities of Daily Living  Outcome: Progressing  Intervention: Promote Activity and Functional Independence  Recent Flowsheet Documentation  Taken 07/04/2022 0800 by Johnsie Cancel, RN  Self-Care Promotion:   independence encouraged   BADL personal objects within reach   BADL personal routines maintained     Problem: Fall Injury Risk  Goal: Absence of Fall and Fall-Related Injury  Outcome: Progressing  Intervention: Identify and Manage Contributors  Recent Flowsheet Documentation  Taken 07/04/2022 0800 by Johnsie Cancel, RN  Self-Care Promotion:   independence encouraged   BADL personal objects within reach   BADL personal routines maintained  Intervention: Promote Injury-Free Environment  Recent Flowsheet Documentation  Taken 07/04/2022 0800 by Johnsie Cancel, RN  Safety Interventions:   aspiration precautions   fall reduction program maintained   lighting adjusted for tasks/safety   low bed   nonskid shoes/slippers when out of bed     Problem: Skin Injury Risk Increased  Goal: Skin Health and Integrity  Outcome: Progressing  Intervention: Optimize Skin Protection  Recent Flowsheet Documentation  Taken 07/04/2022 1200 by Johnsie Cancel, RN  Pressure Reduction Techniques: frequent weight shift encouraged  Taken 07/04/2022 1000 by Johnsie Cancel, RN  Pressure Reduction Techniques: frequent weight shift encouraged  Taken 07/04/2022 0800 by Johnsie Cancel, RN  Pressure Reduction Techniques: frequent weight shift encouraged  Head of Bed (HOB) Positioning: HOB at 30-45 degrees  Pressure Reduction Devices: positioning supports utilized  Skin Protection: silicone foam dressing in place   tubing/devices free from skin contact   adhesive use limited   cleansing with dimethicone incontinence wipes   incontinence pads utilized     Problem: Adult Inpatient Plan of Care  Goal: Plan of Care Review  Outcome: Progressing  Goal: Patient-Specific Goal (Individualized)  Outcome: Progressing  Goal: Absence of Hospital-Acquired Illness or Injury  Outcome: Progressing  Intervention: Identify and Manage Fall Risk  Recent Flowsheet Documentation  Taken 07/04/2022 0800 by Johnsie Cancel, RN  Safety Interventions:   aspiration precautions   fall reduction program maintained   lighting adjusted for tasks/safety   low bed   nonskid shoes/slippers when out of bed  Intervention: Prevent Skin Injury  Recent Flowsheet Documentation  Taken 07/04/2022 0800 by Johnsie Cancel, RN  Skin Protection:   silicone foam dressing in place   tubing/devices free from skin contact   adhesive use limited   cleansing with dimethicone incontinence wipes   incontinence pads utilized  Intervention: Prevent and Manage VTE (Venous Thromboembolism) Risk  Recent Flowsheet Documentation  Taken 07/04/2022 0800 by Johnsie Cancel, RN  Activity Management: activity adjusted per tolerance  VTE Prevention/Management: anticoagulant therapy  Intervention: Prevent Infection  Recent Flowsheet Documentation  Taken 07/04/2022 0800 by Johnsie Cancel, RN  Infection Prevention: single patient room provided  Goal: Optimal Comfort and Wellbeing  Outcome: Progressing  Goal: Readiness for Transition of Care  Outcome: Progressing  Goal: Rounds/Family Conference  Outcome: Progressing     Problem: Infection  Goal: Absence of Infection Signs and Symptoms  Outcome: Progressing  Intervention: Prevent or Manage Infection  Recent Flowsheet Documentation  Taken 07/04/2022 0800 by Doristine Locks  Aundria Rud, RN  Infection Management: aseptic technique maintained  Isolation Precautions: contact precautions maintained     Problem: Impaired Wound Healing  Goal: Optimal Wound Healing  Outcome: Progressing  Intervention: Promote Wound Healing  Recent Flowsheet Documentation  Taken 07/04/2022 0800 by Johnsie Cancel, RN  Activity Management: activity adjusted per tolerance  Sleep/Rest Enhancement:   awakenings minimized   consistent schedule promoted   regular sleep/rest pattern promoted

## 2022-07-04 NOTE — Unmapped (Signed)
Patient admitted from [facility] ED with COPD exacerbation.  Alert and oriented x4. VSS. Afebrile. 4L nasal cannula.  Can ambulate with assistance. RPP pending.  No complaints of pain. WOCN placed for pressure injury on sacrum and lower back. Nebs, steroids and airway clearance is the plan.bed low and locked. Call bell within reach.  Bed alarm on. Will continue with plan of care.     Problem: Self-Care Deficit  Goal: Improved Ability to Complete Activities of Daily Living  Outcome: Progressing     Problem: Fall Injury Risk  Goal: Absence of Fall and Fall-Related Injury  Outcome: Progressing  Intervention: Promote Injury-Free Environment  Recent Flowsheet Documentation  Taken 07/04/2022 0200 by Governor Rooks, RN  Safety Interventions:   fall reduction program maintained   environmental modification     Problem: Skin Injury Risk Increased  Goal: Skin Health and Integrity  Outcome: Progressing  Intervention: Optimize Skin Protection  Recent Flowsheet Documentation  Taken 07/04/2022 0200 by Governor Rooks, RN  Pressure Reduction Techniques: frequent weight shift encouraged  Head of Bed (HOB) Positioning: HOB at 30-45 degrees  Pressure Reduction Devices: positioning supports utilized  Skin Protection: silicone foam dressing in place     Problem: Adult Inpatient Plan of Care  Goal: Plan of Care Review  Outcome: Progressing  Goal: Patient-Specific Goal (Individualized)  Outcome: Progressing  Goal: Absence of Hospital-Acquired Illness or Injury  Outcome: Progressing  Intervention: Identify and Manage Fall Risk  Recent Flowsheet Documentation  Taken 07/04/2022 0200 by Governor Rooks, RN  Safety Interventions:   fall reduction program maintained   environmental modification  Intervention: Prevent Skin Injury  Recent Flowsheet Documentation  Taken 07/04/2022 0200 by Governor Rooks, RN  Skin Protection: silicone foam dressing in place  Intervention: Prevent and Manage VTE (Venous Thromboembolism) Risk  Recent Flowsheet Documentation  Taken 07/04/2022 0200 by Governor Rooks, RN  Activity Management: activity adjusted per tolerance  Goal: Optimal Comfort and Wellbeing  Outcome: Progressing  Goal: Readiness for Transition of Care  Outcome: Progressing  Goal: Rounds/Family Conference  Outcome: Progressing

## 2022-07-04 NOTE — Unmapped (Signed)
Family Medicine Inpatient Service    Progress Note    Team: Family Medicine [place] (pgr [phone])    Hospital Day: 1    ASSESSMENT / PLAN:   Kristopher Obrien is a 71 y.o. male with pmh COPD with bronchiectasis (last CTA 05/10/22) on 4L O2, tobacco use disorder, opioid use disorder, and MDR resistant pseudomonas PNA who presents with dyspnea.     # Acute COPD Exacerbation - Severe COPD and Bronchiectasis - Hx MDR Pseudomonal PNA  Patient with hx of hospitalizations for COPD exacerbations, most recently from 7/5-7/14 in which he grew MDR Achromobacter species. This exacerbation likely 2/2 pneumonia given patient's past history and current CXR findings with mid-lower left lung opacities. Low suspicion for CHF (BNP wnl, euvolemic on exam), sepsis from pneumonia (clinically stable, afebrile), PE (Wells Score 3.0 indicating 16.2% chance in ED population), ACS (trop, EKG wnl). Pt feels stable/improved today.  Ongoing GOC discussion regarding home hospice vs continued treatment.  -Levofloxacin 750mg  daily IV, start cefepime 2g IV q8h for broader PsA coverage  -consult pulmonology  -fu lower respiratory culture, RPP  -duonebs q6h, airway clearance with aerobicka, hypertonic nebs 10%  -prednisone 40mg  for 4 day course starting tomorrow 7/28   -daily bmp, mg, vbg  -Continue GOC conversation     # Opioid Use Disorder  - Continue home suboxone     #Iron Deficiency Anemia  Last colonoscopy in 08/09/2020, found one advanced adenomatous polyp and GI recommended repeat colonoscopy in 3 years.   -Daily cbc   -Continue home iron supplementation     # Checklist:  - IVF None  - Tubes/Lines/Drains: PIV  - Diet Regular  - Bowel Regimen: No indication for a bowel regimen at this time  - DVT: SQ Lovenox  - Code Status:   Orders Placed This Encounter   Procedures    DNR (Do Not Resuscitate) and DNI (Do Not Intubate)     Standing Status:   Standing     Number of Occurrences:   1     - Dispo: Floor    [ ]  Anticipated Discharge Location: Home - pt lives with daughter  [ ]  PT/OT/DME: No needs anticipated  [ ]  CM/SW needs: None anticipated  [ ]  Meds/Rx:  Not yet prescribed. No special med needs  [ ]  Teaching: None anticipated  [ ]  Follow up appt: Appointment needed  [ ]  Excuse letter: Needed  [ ]  Transport: Private Needed    SUBJECTIVE:  Interval events: Pt feeling only slightly improved today from yesterday, says the nebs are helping. No chest pain. No swelling. Pt is feeling very anxious about his prognosis, wants to talk about what's coming down the line for me. Currently lives with family members at home, would want to go home and see friends one more time, visit some people.      REVIEW OF SYSTEMS:  Pertinent positives and negatives per HPI. A complete review of systems otherwise negative.    PHYSICAL EXAM:      Intake/Output Summary (Last 24 hours) at 07/04/2022 0819  Last data filed at 07/04/2022 0555  Gross per 24 hour   Intake 150 ml   Output 1500 ml   Net -1350 ml       Recent Vitals:  Vitals:    07/04/22 0600   BP: 109/58   Pulse: 119   Resp: 15   Temp:    SpO2: 96%       GEN: chronically ill appearing, lying in bed, NAD  HEENT: NCAT, No scleral icterus. Conjunctiva non-erythematous. MMM.  CV: Tachycardic rate and regular rhythm. No murmurs/rubs/gallops.  Pulm: Mildly increased work of breathing on 3L [place] (home O2). Lungs with diffuse crackles worse at bases, no wheezing.  Abd: Flat.  Nontender. No guarding, rebound.  Normoactive bowel sounds.    Neuro: A&O x 3. No focal deficits.  Ext: No peripheral edema.  Palpable distal pulses.  Skin: No rashes or skin lesions.   Psych: Normal mood/affect      LABS/ STUDIES:    All imaging, laboratory studies, and other pertinent tests including electrocardiography within the last 24 hours were reviewed and are summarized within the assessment and plan.     NUTRITION:                           Kristopher Crigler, MD, MPH  Family Med PGY2

## 2022-07-04 NOTE — Unmapped (Signed)
Family Medicine Inpatient Service  History and Physical Note    Team: [facility] [place] (pgr [phone])  PCP: Kristopher Obrien [facility]  Date of Admission: July 03, 2022  Code Status: do not resuscitate  Emergency Contact: Kristopher Obrien [phone]    ASSESSMENT / PLAN:   Kristopher Obrien is a 71 y.o. male with a past medical history significant for COPD with bronchiectasis (last CTA 05/10/22) on 4L O2, tobacco use disorder, opioid use disorder, and MDR resistant pseudomonas PNA who presents with dyspnea    # Acute COPD Exacerbation - Severe COPD and Bronchiectasis - Hx MDR Pseudomonal PNA  Patient with hx of hospitalizations for COPD exacerbations, most recently from 7/5-7/14 in which he grew MDR Achromobacter species. This exacerbation likely 2/2 pneumonia given patient's past history and current CXR findings. Low suspicion for CHF (BNP wnl, euvolemic on exam), sepsis from pneumonia (clinically stable, afebrile), PE (Wells Score 3.0 indicating 16.2% chance in ED population), ACS (trop, EKG wnl).   -Levofloxacin 750mg  daily IV  -consult pulmonology, consider ID consult    -fu lower respiratory culture, RPP  -duonebs q6h   -prednisone 40mg  for 4 day course starting tomorrow 7/28   -airway clearance with RT, IC  -daily bmp, mg, vbg    # Opioid Use Disorder  - Continue home suboxone    #Iron Deficiency Anemia  Last colonoscopy in 08/09/2020, found one advanced adenomatous polyp and GI recommended repeat colonoscopy in 3 years.   -Daily cbc   -Continue home iron supplementation     # FEN/GI:  - IVF None  - Check electrolytes as indicated, replete as needed.  - Diet Regular    # PPX:   - DVT: SQ Lovenox    # Dispo: Step down  [ ]  Anticipated Discharge Location: Skilled nursing facility  [ ]  PT/OT/DME: PT/OT ordered  [ ]  CM/SW needs: CM to arrange SNF  [ ]  Teaching: None anticipated    HISTORY OF PRESENT ILLNESS:  Kristopher Obrien is a 71 y.o. male with a past medical history significant for COPD with bronchiectasis on 4L O2, tobacco use disorder, opioid use disorder, and MDR resistant pseudomonas PNA who presents with dyspnea    06/11/22-06/20/22 Admission at [facility] for COPD exacerbation. Treated with steroids, BiPAP, airway clearance with Aerobika and hypertonic saline. He was started on Levaquin for hx of multidrug-resistant Pseudomonas then transitioned to levaquin (10 day course ended 7/15) when found to have MDR achromobacter on lower respiratory culture. Started on tobramycin for Pseudomonal prophylaxis.Given Prevnar-20 vaccine and discharged on baseline O2 on 4L [place].   7/19- ED for neck pain, [facility]    Current findings concerning for COPD Exacerbation that warrant admission: Severe dyspnea (had to increase to 12L [place] oxygen when going to bathroom), History of hypercapnia, Unable to perform activities of daily living without significant dyspnea, Increased sputum (thick and yellow), Age > 65, and > 2 exacerbation / year  Additional laboratory or imaging findings:  Current tobacco use: No  - Admission O2 therapy: 4L [place]  - Breathing Treatments: duo-nebs, brovana, pulmicort  - Steroids: prednisone 40 mg by mouth for 5 days then stop.    Risk factor for Pseudomonas Yes: (Risks include Advanced COPD, previous pseudomonas culture, concomitant bronchiectasis, frequent antibiotic use, frequent admissions)  - (If YES) Levaquin 750 mg PO or IV  Prior sputum culture available: Yes  - Sputum culture collected this admission: Yes    Outpatient History:  COPD Home Medications include: Albuterol, Pulmicort  GOLD:Gold 4 -  Very Severe, FEV1 <30% predicted  Last PFT known: FEV1 26% on spirometry   Prior Advance Care Planning documented?: DNR/DNI    ED Course:  On exam, vitals are hemodynamically stable. Remarkable for coarse breath sounds in the bilateral lung fields. Audible wheezing. Extremity exam is benign and shows no signs of any edema.     Plan to obtain basic labs, serial hsTrop, BNP, blood gas, CXR, and EKG. Will treat with atrovent, levaquin, solu-medrol, and DuoNebs. We will reassess the patient following his duoneb treatments to evaluate the next steps.      COVID-19 Testing on Admission: Symptomatic & Negative    PAST MEDICAL / SURGICAL HX:  Past Medical History:   Diagnosis Date    Bronchiectasis (CMS-HCC)     Encounter for monitoring Suboxone maintenance therapy     Pneumonia of left lower lobe due to Pseudomonas species (CMS-HCC)     Pulmonary emphysema (CMS-HCC)      Past Surgical History:   Procedure Laterality Date    PR COLONOSCOPY W/BIOPSY SINGLE/MULTIPLE  08/01/2020    Procedure: COLONOSCOPY, FLEXIBLE, PROXIMAL TO SPLENIC FLEXURE; WITH BIOPSY, SINGLE OR MULTIPLE;  Surgeon: Kela Millin, MD;  [facility];  Service: Gastroenterology    PR COLSC FLX W/RMVL OF TUMOR POLYP LESION SNARE TQ N/A 08/01/2020    Procedure: COLONOSCOPY FLEX; W/REMOV TUMOR/LES BY SNARE;  Surgeon: Kela Millin, MD;  Location: [facility];  Service: Gastroenterology    PR COLSC FLX WITH DIRECTED SUBMUCOSAL NJX ANY SBST N/A 08/01/2020    Procedure: COLONOSCOPY, FLEXIBLE, PROXIMAL TO SPLENIC FLEXURE; WITH DIRECTED SUBMUCOSAL INJECTION(S), ANY SUBSTANCE;  Surgeon: Kela Millin, MD;  Location: [facility];  Service: Gastroenterology       FAMILY HX:   Family History   Problem Relation Age of Onset    Hypertension Mother        SOCIAL HX:   Social History     Socioeconomic History    Marital status: Widowed   Tobacco Use    Smoking status: Former     Packs/day: 1.00     Years: 50.00     Pack years: 50.00     Types: Cigarettes     Start date: 11/23/1967     Quit date: 12/09/2019     Years since quitting: 2.5    Smokeless tobacco: Never   Substance and Sexual Activity    Alcohol use: Not Currently    Sexual activity: Not Currently   Social History Narrative    Veteran. Used to live in [place].        Lives with daughter and son-in-law now        Enjoys Tree surgeon and Systems developer Social Determinants of Health     Financial Resource Strain: Low Risk     Difficulty of Paying Living Expenses: Not hard at all   Food Insecurity: No Food Insecurity    Worried About Programme researcher, broadcasting/film/video in the Last Year: Never true    Barista in the Last Year: Never true   Transportation Needs: No Transportation Needs    Lack of Transportation (Medical): No    Lack of Transportation (Non-Medical): No       MEDICATIONS / ALLERGIES:  (Not in a hospital admission)      No Known Allergies    IMMUNIZATIONS:  There is no immunization history for the selected administration types on file for this patient.    REVIEW  OF SYSTEMS:  Pertinent positives and negatives per HPI. A complete review of systems otherwise negative.    PHYSICAL EXAM:    Initial ED Vitals:   ED Triage Vitals [07/03/22 1912]   Enc Vitals Group      BP 106/62      Heart Rate 101      SpO2 Pulse       Resp 21      Temp       Temp src       SpO2 93 %      Weight       Height       Head Circumference       Peak Flow       Pain Score       Pain Loc       Pain Edu?       Excl. in GC?        Recent Vitals:  Vitals:    07/03/22 1912   BP: 106/62   Pulse: 101   Resp: 21   SpO2: 93%       GEN: Appeared older than age, lying in bed, breathing comfortably at 4L [place]  HEENT: NCAT, No scleral icterus. Conjunctiva non-erythematous.   CV: Regular rate and rhythm. No murmurs/rubs/gallops.   Pulm: Rhonchus, coarse breath sounds in all lung fields with underlying wheezes  Abd: Flat.  Nontender. No guarding, rebound.     Neuro: Grossly oriented with no notable focal deficits.   Ext: No peripheral edema or erythema bilaterally.    Skin: Bruising throughout      LABS/ STUDIES:  All imaging, laboratory studies, and other pertinent tests including electrocardiography were reviewed prior to admission and are summarized within the assessment and plan.     Rolene Arbour, MD PGY1  July 03, 2022 10:22 PM

## 2022-07-04 NOTE — Unmapped (Signed)
WOCN Consult Services                                                                 Wound Evaluation     Reason for Consult:   - Initial  - Wound    Problem List:   Principal Problem:    COPD exacerbation (CMS-HCC)  Active Problems:    Stage 4 very severe COPD by GOLD classification (CMS-HCC)    Pulmonary nodule    Bronchiectasis with acute exacerbation (CMS-HCC)    Chronic pain due to trauma    Recurrent pneumonia    Dyspnea and respiratory abnormalities    History of MDR Pseudomonas aeruginosa infection    History of smoking greater than 50 pack years    Buprenorphine dependence (CMS-HCC)    Assessment:     Patient is a 71 year old male, with history of severe COPD with chronic hypercapnic and hypoxemic respiratory failure, bronchiectasis with multidrug-resistant organisms with Achromobacter and Pseudomonas, with recurrent hospitalizations and admissions for COPD and bronchiectasis exacerbation, was admitted on 7/27 at [facility] for a COPD and bronchiectasis exacerbation requiring 6 L of oxygen compared to baseline 3 to 4 L. This is his third admission in the last 7 months for the same reason.     Patient is in CCU today. He needs to sit upright and becomes very short of breath when lying flat. He has his own waffle cushion which he states he bought off of [employer] and he is sitting on that in bed.    We are consulted for wounds on lower back and sacrum.  He currently has Mepilex over both areas, which is appropriate.  His heels are blanchable. Wounds are POA.       Wound 07/04/22 Other (comment) Buttocks Left;Mid (Active)   Properties   Placement Date 07/04/22   Placement Time 0225   Location Buttocks   Present on Hospital Admission Y   Primary Wound Type Other   Wound Location Orientation Left;Mid   LIP Notified Of Pressure Injury Yes   Name of LIP S. Glotfelty, MD      Assessments 07/04/2022 12:00 PM   Wound Image     Wound Length (cm) 1 cm Wound Width (cm) 1.5 cm   Wound Depth (cm) 0.2 cm   Wound Surface Area (cm^2) 1.5 cm^2   Wound Volume (cm^3) 0.3 cm^3   Wound Bed Pink;Red   Odor None   Exudate Amnt      None   Tunneling      No   Undermining     No       Wound 07/04/22 Pressure Injury Back Lower;Mid Stage 1 (Active)   Properties   Placement Date 07/04/22   Placement Time 0227   Location Back   Present on Hospital Admission Y   Primary Wound Type Pressure inj   Wound Location Orientation Lower;Mid   Staging Stage 1   Medical Device Related Pressure Injury No   Staged By WOCN no   LIP Notified Of Pressure Injury Yes   Name of LIP S. Glotfelty, MD      Assessments 07/04/2022 12:00 PM   Wound Image            Continence Status:   Continent  Lab Results   Component Value Date    WBC 12.3 (H) 07/04/2022    HGB 8.8 (L) 07/04/2022    HCT 27.5 (L) 07/04/2022    ESR 75 (H) 06/02/2022    CRP 182.0 (H) 06/13/2022    GLUF 91 08/28/2018    GLU 151 07/04/2022    ALBUMIN 2.6 (L) 07/03/2022    PROT 7.5 07/03/2022         Support Surface:   - Low Air Loss - ICU    Offloading:  Left: Pillow  Right: Pillow    Type Debridement Completed By WOCN:  N/A    Teaching:  - Offloading  - Turning and repositioning  - Wound care    WOCN Recommendations:   - See nursing orders for wound care instructions.  - Contact WOCN with questions, concerns, or wound deterioration.    Topical Therapy/Interventions:   - Silicone bordered foam    Recommended Consults:  - Not Applicable    WOCN Follow Up:  - Weekly    Plan of Care Discussed With:   - Patient  - RN [employer] Ordered: No    Workup Time:   45 minutes    Tonny Bollman, BSN, RN, [facility]  Wound Ostomy Consult Service

## 2022-07-04 NOTE — Unmapped (Addendum)
Kristopher Obrien is a 71 y.o. male with pmh COPD with bronchiectasis (last CTA 05/10/22) on 4L O2, tobacco use disorder, opioid use disorder, and MDR resistant pseudomonas PNA who presents with dyspnea.     # Acute COPD Exacerbation - Severe COPD and Bronchiectasis - Hx MDR Pseudomonal PNA  Patient with hx of hospitalizations for COPD exacerbations, most recently from 7/5-7/14 in which he grew MDR Achromobacter species. This exacerbation likely 2/2 pneumonia given patient's past history and current CXR findings with mid-lower left lung opacities. LRCx unremarkable. Pt elected to go home with home hospice after long GOC discussion. Last day of IV abx completed prior to discharge, will be sent home with remaining days of PO levaquin 750 mg through 8/9. Also discharged on prednisone taper per pulmonology. Continue airway clearance with DuoNeb QID, hypertonic saline 10% QID, and Aerobika QID all of which were prescribed  at discharge. Restarted tobramycin nebs on discharge.     # Opioid Use Disorder: Continued home suboxone     #Iron Deficiency Anemia: Last colonoscopy in 08/09/2020, found one advanced adenomatous polyp. Anemia stable at baseline. Continued on oral iron throughout hospitalization.

## 2022-07-05 LAB — BASIC METABOLIC PANEL
ANION GAP: 7 mmol/L (ref 5–14)
BLOOD UREA NITROGEN: 17 mg/dL (ref 9–23)
BUN / CREAT RATIO: 21
CALCIUM: 9.4 mg/dL (ref 8.7–10.4)
CHLORIDE: 102 mmol/L (ref 98–107)
CO2: 30.8 mmol/L (ref 20.0–31.0)
CREATININE: 0.81 mg/dL
EGFR CKD-EPI (2021) MALE: >90 mL/min/{1.73_m2} (ref >=60)
GLUCOSE RANDOM: 114 mg/dL (ref 70–179)
POTASSIUM: 4.2 mmol/L (ref 3.4–4.8)
SODIUM: 140 mmol/L (ref 135–145)

## 2022-07-05 LAB — CBC
HEMATOCRIT: 25.6 % — ABNORMAL LOW (ref 39.0–48.0)
HEMOGLOBIN: 8.1 g/dL — ABNORMAL LOW (ref 12.9–16.5)
MEAN CORPUSCULAR HEMOGLOBIN CONC: 31.7 g/dL — ABNORMAL LOW (ref 32.0–36.0)
MEAN CORPUSCULAR HEMOGLOBIN: 26.9 pg (ref 25.9–32.4)
MEAN CORPUSCULAR VOLUME: 84.8 fL (ref 77.6–95.7)
MEAN PLATELET VOLUME: 7.5 fL (ref 6.8–10.7)
PLATELET COUNT: 361 10*9/L (ref 150–450)
RED BLOOD CELL COUNT: 3.01 10*12/L — ABNORMAL LOW (ref 4.26–5.60)
RED CELL DISTRIBUTION WIDTH: 15.5 % — ABNORMAL HIGH (ref 12.2–15.2)
WBC ADJUSTED: 16.3 10*9/L — ABNORMAL HIGH (ref 3.6–11.2)

## 2022-07-05 LAB — BLOOD GAS, VENOUS
BASE EXCESS VENOUS: 7.3 — ABNORMAL HIGH (ref -2.0–2.0)
HCO3 VENOUS: 30 mmol/L — ABNORMAL HIGH (ref 22–27)
O2 SATURATION VENOUS: 96.6 % — ABNORMAL HIGH (ref 40.0–85.0)
PCO2 VENOUS: 57 mmHg (ref 40–60)
PH VENOUS: 7.37 (ref 7.32–7.43)
PO2 VENOUS: 85 mmHg — ABNORMAL HIGH (ref 35–40)

## 2022-07-05 LAB — MAGNESIUM: MAGNESIUM: 2.2 mg/dL (ref 1.6–2.6)

## 2022-07-05 MED ADMIN — buprenorphine-naloxone (SUBOXONE) 8-2 mg SL tablet 8 mg of buprenorphine: 1 | SUBLINGUAL | @ 01:00:00

## 2022-07-05 MED ADMIN — budesonide (PULMICORT) nebulizer solution 0.5 mg: .5 mg | RESPIRATORY_TRACT | @ 13:00:00 | Stop: 2022-07-05

## 2022-07-05 MED ADMIN — ipratropium-albuteroL (DUO-NEB) 0.5-2.5 mg/3 mL nebulizer solution 3 mL: 3 mL | RESPIRATORY_TRACT | @ 17:00:00

## 2022-07-05 MED ADMIN — gabapentin (NEURONTIN) capsule 900 mg: 900 mg | ORAL | @ 19:00:00

## 2022-07-05 MED ADMIN — budesonide (PULMICORT) nebulizer solution 0.5 mg: .5 mg | RESPIRATORY_TRACT | @ 02:00:00

## 2022-07-05 MED ADMIN — enoxaparin (LOVENOX) syringe 40 mg: 40 mg | SUBCUTANEOUS | @ 01:00:00

## 2022-07-05 MED ADMIN — tobramycin (PF) (TOBI) 300 mg/5 mL nebulizer solution 300 mg: 300 mg | RESPIRATORY_TRACT | @ 02:00:00

## 2022-07-05 MED ADMIN — ipratropium-albuteroL (DUO-NEB) 0.5-2.5 mg/3 mL nebulizer solution 3 mL: 3 mL | RESPIRATORY_TRACT | @ 13:00:00 | Stop: 2022-07-05

## 2022-07-05 MED ADMIN — lidocaine (LIDODERM) 5 % patch 1 patch: 1 | TRANSDERMAL | @ 19:00:00

## 2022-07-05 MED ADMIN — ipratropium-albuteroL (DUO-NEB) 0.5-2.5 mg/3 mL nebulizer solution 3 mL: 3 mL | RESPIRATORY_TRACT | @ 08:00:00 | Stop: 2022-07-05

## 2022-07-05 MED ADMIN — sodium chloride 10 % NEBULIZER solution 5 mL: 5 mL | RESPIRATORY_TRACT | @ 13:00:00 | Stop: 2022-07-05

## 2022-07-05 MED ADMIN — buprenorphine-naloxone (SUBOXONE) 8-2 mg SL tablet 8 mg of buprenorphine: 1 | SUBLINGUAL | @ 19:00:00

## 2022-07-05 MED ADMIN — cefepime (MAXIPIME) 2 g in sodium chloride 0.9 % (NS) 100 mL IVPB-MBP: 2 g | INTRAVENOUS | @ 10:00:00 | Stop: 2022-07-09

## 2022-07-05 MED ADMIN — ipratropium-albuteroL (DUO-NEB) 0.5-2.5 mg/3 mL nebulizer solution 3 mL: 3 mL | RESPIRATORY_TRACT | @ 21:00:00

## 2022-07-05 MED ADMIN — budesonide (PULMICORT) nebulizer solution 0.5 mg: .5 mg | RESPIRATORY_TRACT | @ 21:00:00

## 2022-07-05 MED ADMIN — cefepime (MAXIPIME) 2 g in sodium chloride 0.9 % (NS) 100 mL IVPB-MBP: 2 g | INTRAVENOUS | @ 02:00:00 | Stop: 2022-07-09

## 2022-07-05 MED ADMIN — cefepime (MAXIPIME) 2 g in sodium chloride 0.9 % (NS) 100 mL IVPB-MBP: 2 g | INTRAVENOUS | @ 19:00:00 | Stop: 2022-07-09

## 2022-07-05 MED ADMIN — gabapentin (NEURONTIN) capsule 900 mg: 900 mg | ORAL | @ 13:00:00

## 2022-07-05 MED ADMIN — tobramycin (PF) (TOBI) 300 mg/5 mL nebulizer solution 300 mg: 300 mg | RESPIRATORY_TRACT | @ 13:00:00 | Stop: 2022-07-05

## 2022-07-05 MED ADMIN — predniSONE (DELTASONE) tablet 40 mg: 40 mg | ORAL | @ 13:00:00 | Stop: 2022-07-08

## 2022-07-05 MED ADMIN — buprenorphine-naloxone (SUBOXONE) 8-2 mg SL tablet 8 mg of buprenorphine: 1 | SUBLINGUAL | @ 10:00:00

## 2022-07-05 MED ADMIN — sodium chloride 10 % NEBULIZER solution 5 mL: 5 mL | RESPIRATORY_TRACT | @ 17:00:00

## 2022-07-05 MED ADMIN — ipratropium-albuteroL (DUO-NEB) 0.5-2.5 mg/3 mL nebulizer solution 3 mL: 3 mL | RESPIRATORY_TRACT | @ 02:00:00

## 2022-07-05 MED ADMIN — sodium chloride 10 % NEBULIZER solution 5 mL: 5 mL | RESPIRATORY_TRACT | @ 02:00:00

## 2022-07-05 MED ADMIN — levoFLOXacin (LEVAQUIN) 750 mg/150 mL IVPB 750 mg: 750 mg | INTRAVENOUS | @ 02:00:00 | Stop: 2022-07-09

## 2022-07-05 MED ADMIN — gabapentin (NEURONTIN) capsule 900 mg: 900 mg | ORAL | @ 01:00:00

## 2022-07-05 MED ADMIN — sodium chloride 10 % NEBULIZER solution 5 mL: 5 mL | RESPIRATORY_TRACT | @ 21:00:00

## 2022-07-05 NOTE — Unmapped (Signed)
Care Management  Initial Transition Planning Assessment              General  Care Manager assessed the patient by : In person interview with patient, Medical record review, Discussion with Clinical Care team  Orientation Level: Oriented X4  Functional level prior to admission: Independent  Reason for referral: Discharge Planning    Contact/Decision Maker  Extended Emergency Contact Information  Primary Emergency Contact: perkins,anthony  Address: [address]           [place], [place] [address] [place] Phone: [phone]  Relation: Son in [place]  Secondary Emergency Contact: Perkins,Michelle  Mobile Phone: [phone]  Relation: Daughter    Legal Next of Kin / Guardian / POA / Advance Directives     HCDM (patient stated preference): perkins,anthony - Son in [place] [phone]    HCDM (patient stated preference): Alvia [place] - Daughter - [phone]    Advance Directive (Medical Treatment)  Does patient have an advance directive covering medical treatment?: Patient has advance directive covering medical treatment, copy in chart.    Health Care Decision Maker [HCDM] (Medical & Mental Health Treatment)  Healthcare Decision Maker: HCDM documented in the HCDM/Contact Info section.  Information offered on HCDM, Medical & Mental Health advance directives:: Patient given information.    Advance Directive (Mental Health Treatment)  Does patient have an advance directive covering mental health treatment?: Patient does not have advance directive covering mental health treatment.  Reason patient does not have an advance directive covering mental health treatment:: Patient does not wish to complete one at this time.    Readmission Information    Have you been hospitalized in the last 30 days?: No        Patient Information  Lives with: Children    Type of Residence: Private residence        Location/Detail: [address] [place] [address] phone [phone]    Support Systems/Concerns: Children    Responsibilities/Dependents at home?: No    Home Care services in place prior to admission?: No          Outpatient/Community Resources in place prior to admission: Clinic  Agency detail (Name/Phone #): PCP Katherina Right    Equipment Currently Used at Home: respiratory supplies, oxygen  Current HME Agency (Name/Phone #): [facility] [phone]    Currently receiving outpatient dialysis?: No       Financial Information       Need for financial assistance?: No       Social Determinants of Health  Social Determinants of Health were addressed in provider documentation.  Please refer to patient history.  Social Determinants of Health     Financial Resource Strain: Low Risk    ??? Difficulty of Paying Living Expenses: Not hard at all   Internet Connectivity: Not on file   Food Insecurity: No Food Insecurity   ??? Worried About Programme researcher, broadcasting/film/video in the Last Year: Never true   ??? Ran Out of Food in the Last Year: Never true   Tobacco Use: Medium Risk   ??? Smoking Tobacco Use: Former   ??? Smokeless Tobacco Use: Never   ??? Passive Exposure: Not on file   Housing/Utilities: Low Risk    ??? Within the past 12 months, have you ever stayed: outside, in a car, in a tent, in an overnight shelter, or temporarily in someone else's home (i.e. couch-surfing)?: No   ??? Are you  worried about losing your housing?: No   ??? Within the past 12 months, have you been unable to get utilities (heat, electricity) when it was really needed?: No   Alcohol Use: Not At Risk   ??? How often do you have a drink containing alcohol?: Never   ??? How many drinks containing alcohol do you have on a typical day when you are drinking?: 1 - 2   ??? How often do you have 5 or more drinks on one occasion?: Never   Transportation Needs: No Transportation Needs   ??? Lack of Transportation (Medical): No   ??? Lack of Transportation (Non-Medical): No   Substance Use: Low Risk    ??? Taken prescription drugs for non-medical reasons: Never   ??? Taken illegal drugs: Never   ??? Patient indicated they have taken drugs in the past year for non-medical reasons: Yes, [positive answer(s)]: Not on file   Health Literacy: Not on file   Physical Activity: Not on file   Interpersonal Safety: Not on file   Stress: Not on file   Intimate Partner Violence: Not on file   Depression: Not on file   Social Connections: Not on file       Complex Discharge Information    Is patient identified as a difficult/complex discharge?: No    Discharge Needs Assessment  Concerns to be Addressed: discharge planning, decision-making    Clinical Risk Factors: > 65, New Diagnosis, Multiple Diagnoses (Chronic), Readmission < 30 Days, Functional Limitations, Visual or Hearing Impaired    Barriers to taking medications: No    Prior overnight hospital stay or ED visit in last 90 days: Yes              Anticipated Changes Related to Illness: inability to care for self    Equipment Needed After Discharge: other (see comments) (requesting rollator)    Discharge Facility/Level of Care Needs: other (see comments) (home hospice)    Readmission  Risk of Unplanned Readmission Score: UNPLANNED READMISSION SCORE: 43.25%  Predictive Model Details          43% (High)  Factor Value    Calculated 07/04/2022 16:04 26% Number of hospitalizations in last year 9    [facility] Risk of Unplanned Readmission Model 20% Number of ED visits in last six months 6     12% Diagnosis of drug abuse present     11% Number of active Rx orders 27     6% ECG/EKG order present in last 6 months     5% Encounter of ten days or longer in last year present     4% Imaging order present in last 6 months     4% Latest hemoglobin low (8.8 g/dL)     3% Age 71     3% Active anticoagulant Rx order present     3% Active corticosteroid Rx order present     1% Charlson Comorbidity Index 2     1% Future appointment scheduled     1% Active ulcer medication Rx order present     0% Current length of stay 0.705 days      Readmitted Within the Last 30 Days? (No if blank) Yes  Patient at risk for readmission?: Yes    Discharge Plan  Screen findings are: Discharge planning needs identified or anticipated (Comment). (patient agreeable with home hospice.)    Expected Discharge Date:     Expected Transfer from Critical Care:      Quality data for continuing  care services shared with patient and/or representative?: Yes  Patient and/or family were provided with choice of facilities / services that are available and appropriate to meet post hospital care needs?: Yes   List choices in order highest to lowest preferred, if applicable. : [facility], benefit inquiry submitted for [facility] service connection.    Initial Assessment complete?: Yes

## 2022-07-05 NOTE — Unmapped (Signed)
3L [place]. All treatments per order. Patient progressing toward goals.       Problem: Self-Care Deficit  Goal: Improved Ability to Complete Activities of Daily Living  Outcome: Progressing     Problem: Fall Injury Risk  Goal: Absence of Fall and Fall-Related Injury  Outcome: Progressing     Problem: Skin Injury Risk Increased  Goal: Skin Health and Integrity  Outcome: Progressing     Problem: Adult Inpatient Plan of Care  Goal: Plan of Care Review  Outcome: Progressing  Goal: Patient-Specific Goal (Individualized)  Outcome: Progressing  Goal: Absence of Hospital-Acquired Illness or Injury  Outcome: Progressing  Goal: Optimal Comfort and Wellbeing  Outcome: Progressing  Goal: Readiness for Transition of Care  Outcome: Progressing  Goal: Rounds/Family Conference  Outcome: Progressing     Problem: Infection  Goal: Absence of Infection Signs and Symptoms  Outcome: Progressing     Problem: Impaired Wound Healing  Goal: Optimal Wound Healing  Outcome: Progressing     Problem: Infection (Pneumonia)  Goal: Resolution of Infection Signs and Symptoms  Outcome: Progressing     Problem: Functional Ability Impaired COPD (Chronic Obstructive Pulmonary Disease)  Goal: Optimal Level of Functional Independence  Outcome: Progressing     Problem: Infection COPD (Chronic Obstructive Pulmonary Disease)  Goal: Absence of Infection Signs and Symptoms  Outcome: Progressing

## 2022-07-05 NOTE — Unmapped (Signed)
Home health has been set up for through the agency listed below. The Home health agency will be contacting you to set up a time for them to come see you in your home within 2 days of your discharge.  If you have not heard from them prior to 07/07/22 or you have any questions about home health, please contact them at the phone number listed below.    [facility] - Admitted Since 07/03/2022       Service Provider Selected Services Address Phone Fax Patient Preferred    [facility] [address], [place] [address] [phone] --

## 2022-07-05 NOTE — Unmapped (Signed)
Problem: Self-Care Deficit  Goal: Improved Ability to Complete Activities of Daily Living  Outcome: Progressing     Problem: Fall Injury Risk  Goal: Absence of Fall and Fall-Related Injury  Outcome: Progressing     Problem: Skin Injury Risk Increased  Goal: Skin Health and Integrity  Outcome: Progressing  Intervention: Optimize Skin Protection  Recent Flowsheet Documentation  Taken 07/05/2022 0600 by Arby Barrette Aeron Donaghey, RN  Head of Bed [facility]) Positioning: HOB at 30-45 degrees  Taken 07/05/2022 0400 by Arby Barrette Linet Brash, RN  Head of Bed (HOB) Positioning: HOB at 30-45 degrees     Problem: Adult Inpatient Plan of Care  Goal: Plan of Care Review  Outcome: Progressing  Goal: Patient-Specific Goal (Individualized)  Outcome: Progressing  Goal: Absence of Hospital-Acquired Illness or Injury  Outcome: Progressing  Goal: Optimal Comfort and Wellbeing  Outcome: Progressing  Goal: Readiness for Transition of Care  Outcome: Progressing  Goal: Rounds/Family Conference  Outcome: Progressing     Problem: Infection  Goal: Absence of Infection Signs and Symptoms  Outcome: Progressing     Problem: Impaired Wound Healing  Goal: Optimal Wound Healing  Outcome: Progressing     Problem: Infection (Pneumonia)  Goal: Resolution of Infection Signs and Symptoms  Outcome: Progressing     Problem: Respiratory Compromise (Pneumonia)  Goal: Effective Oxygenation and Ventilation  Outcome: Progressing  Intervention: Optimize Oxygenation and Ventilation  Recent Flowsheet Documentation  Taken 07/05/2022 0600 by Arby Barrette Cristina Mattern, RN  Head of Bed [facility]) Positioning: HOB at 30-45 degrees  Taken 07/05/2022 0400 by Arby Barrette Addilyne Backs, RN  Head of Bed (HOB) Positioning: HOB at 30-45 degrees     Problem: Adjustment to Illness COPD (Chronic Obstructive Pulmonary Disease)  Goal: Optimal Chronic Illness Coping  Outcome: Progressing     Problem: Functional Ability Impaired COPD (Chronic Obstructive Pulmonary Disease)  Goal: Optimal Level of Functional Independence  Outcome: Progressing     Problem: Infection COPD (Chronic Obstructive Pulmonary Disease)  Goal: Absence of Infection Signs and Symptoms  Outcome: Progressing     Problem: Oral Intake Inadequate COPD (Chronic Obstructive Pulmonary Disease)  Goal: Improved Nutrition Intake  Outcome: Progressing     Problem: Respiratory Compromise COPD (Chronic Obstructive Pulmonary Disease)  Goal: Effective Oxygenation and Ventilation  Outcome: Progressing  Intervention: Optimize Oxygenation and Ventilation  Recent Flowsheet Documentation  Taken 07/05/2022 0600 by Arby Barrette Aalivia Mcgraw, RN  Head of Bed [facility]) Positioning: HOB at 30-45 degrees  Taken 07/05/2022 0400 by Arby Barrette Windell Musson, RN  Head of Bed (HOB) Positioning: HOB at 30-45 degrees     Problem: Asthma Comorbidity  Goal: Maintenance of Asthma Control  Outcome: Progressing     Problem: Behavioral Health Comorbidity  Goal: Maintenance of Behavioral Health Symptom Control  Outcome: Progressing     Problem: COPD (Chronic Obstructive Pulmonary Disease) Comorbidity  Goal: Maintenance of COPD Symptom Control  Outcome: Progressing     Problem: Diabetes Comorbidity  Goal: Blood Glucose Level Within Targeted Range  Outcome: Progressing     Problem: Heart Failure Comorbidity  Goal: Maintenance of Heart Failure Symptom Control  Outcome: Progressing     Problem: Hypertension Comorbidity  Goal: Blood Pressure in Desired Range  Outcome: Progressing     Problem: Obstructive Sleep Apnea Risk or Actual Comorbidity Management  Goal: Unobstructed Breathing During Sleep  Outcome: Progressing     Problem: Osteoarthritis Comorbidity  Goal: Maintenance of Osteoarthritis Symptom Control  Outcome: Progressing     Problem: Pain Chronic (Persistent) (Comorbidity Management)  Goal: Acceptable Pain  Control and Functional Ability  Outcome: Progressing     Problem: Seizure Disorder Comorbidity  Goal: Maintenance of Seizure Control  Outcome: Progressing

## 2022-07-05 NOTE — Unmapped (Signed)
The patient's respiratory status this shift has been improving .     The patient is currently on nasal cannula 4-5 lpm per home regimen    Lung sounds are   .coarse    The patients cough has been non-productive.    Additional interventions will occur as needed.     Patient tolerated treatments and therapy well during the day

## 2022-07-05 NOTE — Unmapped (Signed)
Problem: Adult Inpatient Plan of Care  Goal: Plan of Care Review  Outcome: Ongoing - Unchanged     Problem: Adult Inpatient Plan of Care  Goal: Plan of Care Review  Outcome: Ongoing - Unchanged     Problem: Adult Inpatient Plan of Care  Goal: Plan of Care Review  Outcome: Ongoing - Unchanged     Problem: Adult Inpatient Plan of Care  Goal: Plan of Care Review  Outcome: Ongoing - Unchanged     Problem: Infection  Goal: Absence of Infection Signs and Symptoms  Outcome: Ongoing - Unchanged     Problem: Respiratory Compromise (Pneumonia)  Goal: Effective Oxygenation and Ventilation  Outcome: Ongoing - Unchanged     Problem: Adjustment to Illness COPD (Chronic Obstructive Pulmonary Disease)  Goal: Optimal Chronic Illness Coping  Outcome: Ongoing - Unchanged     Problem: Functional Ability Impaired COPD (Chronic Obstructive Pulmonary Disease)  Goal: Optimal Level of Functional Independence  Outcome: Ongoing - Unchanged     Problem: Infection COPD (Chronic Obstructive Pulmonary Disease)  Goal: Absence of Infection Signs and Symptoms  Outcome: Ongoing - Unchanged     Problem: Oral Intake Inadequate COPD (Chronic Obstructive Pulmonary Disease)  Goal: Improved Nutrition Intake  Outcome: Ongoing - Unchanged     Problem: Respiratory Compromise COPD (Chronic Obstructive Pulmonary Disease)  Goal: Effective Oxygenation and Ventilation  Outcome: Ongoing - Unchanged   All scheduled nebs and airway clearance given as ordered.Patient remains on 3 lpm nasal cannula with no distress noted

## 2022-07-05 NOTE — Unmapped (Signed)
General Pulmonary Team Follow Up Consult Note     Date of Service: 07/05/2022  Requesting Physician: Doran Durand, MD   Requesting Service: Family Medicine [facility])  Reason for consultation: Comprehensive evaluation of  COPD vs Bronchiectasis exacerbation .    Hospital Problems:  Principal Problem:    COPD exacerbation (CMS-HCC)  Active Problems:    Stage 4 very severe COPD by GOLD classification (CMS-HCC)    Pulmonary nodule    Bronchiectasis with acute exacerbation (CMS-HCC)    Chronic pain due to trauma    Recurrent pneumonia    Dyspnea and respiratory abnormalities    History of MDR Pseudomonas aeruginosa infection    History of smoking greater than 50 pack years    Buprenorphine dependence (CMS-HCC)      HPI: Kristopher Obrien is a 71 y.o. male with a past medical history significant pertinent for GOLD 4 Group E COPD (last PFTs 07/27/20) on 4 L [place] at home, bronchiectasis complicated by multiple prior exacerbations (last discharged 06/20/22 from [facility]) with prior cultures growing multi-drug-resistant PsA and MDR Achromobacter, prior pulmonary hypertension by echocardiography (2021), history of tobacco use disorder (quit 2-3 years ago, 50+ pk-yrs), opioid use disorder, who presented 7/27 with dyspnea.     Problems addressed during this consult include acute hypercapneic respiratory failure acute hypoxic respiratory failure acute on chronic respiratory failure bronchiectasis COPD exacerbation history of MDR Pseudomonas infection, tobacco use disorder  . Based on these problems, the patient has moderate risk of morbidity/mortality which is commensurate w their risk of management options described below in the recommendations.    Assessment      Interval Events:  Patient was afebrile overnight with ST in 100s and normotensive on 3 L [place] saturating >90%. VBG overnight 7.37/57/85/30. Net positive +75 ml. Leukocytosis increased to 16.3 in setting of treatment for bronchiectasis/COPD exacerbation 2/2 LLL PNA and steroid treatment with prednisone 40 mg. H&H decreased 8.1/25.6. BMP unremarkable. LRCx returned with >25 PMNS/LPF and 4+ smear results which suggest mixed oral flora.       Impression:  I personally reviewed most recent pertinent labs, imaging and micro data, from 07/05/2022, which are noted in recommendations..        Recommendations     #Acute COPD Exacerbation vs Bronchiectasis Exacerbation 2/2 superimposed LLL pneumonia   #GOLD Stage E COPD  #Bronchiectasis c/b history of MDR PsA and MDR Achromobacter   Patient has had multiple hospitalizations for COPD vs Bronchiectasis exacerbations, with his most recent from 7/5-7/14. Other possible causes of his acute on chronic hypercapnic hypoxic respiratory failure including CHF, sepsis, PE, and ACS are less likely given physical exam, laboratory, and radiologic findings. CXR consistent with LLL pneumonia, which is likely exacerbating factor. Patient was restarted on Levofloxacin on admission given prior susceptibility and had Cefepime added for double GNR coverage to include PsA. RPP negative. LRCx growing mixed oral flora, culture pending. VBG on admission 7.33/69/17/30. Now 7.37/57/85/30 on 3 L [place]. WBC 16.3 on prednisone 40 mg daily.    -Continue Levaquin and Cefepime for 14 days (7/27-8/10), but will decide based on clinical course and antibiotic regimen based on resulting LRCx culture  -Continue Pulmicort 0.5 mg BID  -Continue DuoNeb Q6H, Albuterol Q4H PRN  -Continue prednisone 40 mg (7/28-7/31) for lower airway congestion  -Continue airway clearance with DuoNeb QID, hypertonic saline 10% QID, and Aerobika QID  -Wean oxygen via [place] to goal SpO2 88-92%  -Hold inhaled Tobramycin while in acute bronchiectasis exacerbation and in setting of double  GNR coverage. Plan to restart upon discharge for 28 day course.  -Nocturnal BiPAP  -Can safely move to floor status    This patient was seen and evaluated with Dr. Marcos Eke . Please do not hesitate to page [phone] [facility] consult) with questions. We appreciate the opportunity to assist in the care of this patient. We look forward to following with you.    Barnet Glasgow, MD    Subjective & Objective     Vitals - past 24 hours  Temp:  [36.6 ??C (97.8 ??F)-36.8 ??C (98.3 ??F)] 36.8 ??C (98.2 ??F)  Heart Rate:  [88-125] 106  SpO2 Pulse:  [87-126] 107  Resp:  [11-22] 18  BP: (91-104)/(52-63) 91/52  SpO2:  [93 %-99 %] 95 % Intake/Output  I/O last 3 completed shifts:  In: 2500 [P.O.:2000; IV Piggyback:500]  Out: 3775 [Urine:3775]      Pertinent exam findings:   General appearance - oriented to person, place, and time, normal appearing weight, playful, active, chronically ill appearing, and in no distress  Eyes - EOMI, PERRLA, anicteric sclerae, pink conjunctiva  Mouth - moist mucous membranes, no pharyngeal erythema or exudates  Neck - Trachea midline, normal neck movement  Heart - regular rate and rhythm, normal S1/S2, no gallops, rubs, or murmurs  Chest - rhonchi noted diffusely  Abdomen - soft, nontender, nondistended  Extremities - No lower extremity edema, no clubbing or cyanosis  Skin - normal coloration and turgor, no rashes, no suspicious skin lesions noted  Neurological - alert, oriented, normal speech, no focal findings or movement disorder noted    Relevant Imaging:  - CXR on 07/03/22 revealed increased opacity in the mid to lower left lung superimposed on chronic parenchymal changes, raising suspicion for acute pneumonia.   - CT chest without contrast on 05/16/22 revealed stable multifocal consolidation related to prior episodes of infection or aspiration with no acute consolidation. Fixed stricture bronchus intermedius without change. There are secretions at this level. This may be related to prior infection or aspiration. Significant coronary artery and aortic valve calcification. Correlate cardiac echo as indicated to assess for possible aortic stenosis. Extensive centrilobular and paraseptal emphysema.    Arterial Blood Gas: No results in the last day     Venous Blood Gas:   Recent Labs   Lab Units 07/05/22  0542   PH VEN  7.37   PCO2 VEN mm Hg 57   PO2 VEN mm Hg 85*   HCO3 VEN mmol/L 30*   BASE EXC VEN  7.3*   O2 SAT VEN % 96.6*          Cultures:  Lower Respiratory Culture (no units)   Date Value   07/04/2022 TOO YOUNG TO READ   06/15/2022 2+ Achromobacter species (A)   06/15/2022 3+ Oropharyngeal Flora Isolated     WBC (10*9/L)   Date Value   07/05/2022 16.3 (H)          Other Labs:  Lab Results   Component Value Date    WBC 16.3 (H) 07/05/2022    HGB 8.1 (L) 07/05/2022    HCT 25.6 (L) 07/05/2022    PLT 361 07/05/2022     Lab Results   Component Value Date    NA 140 07/05/2022    K 4.2 07/05/2022    CL 102 07/05/2022    CO2 30.8 07/05/2022    BUN 17 07/05/2022    CREATININE 0.81 07/05/2022    GLU 114 07/05/2022    CALCIUM 9.4 07/05/2022  MG 2.2 07/05/2022    PHOS 2.5 05/07/2022     Lab Results   Component Value Date    BILITOT 0.2 (L) 07/03/2022    BILIDIR <0.10 11/03/2021    PROT 7.5 07/03/2022    ALBUMIN 2.6 (L) 07/03/2022    ALT <7 (L) 07/03/2022    AST 12 07/03/2022    ALKPHOS 104 07/03/2022     Lab Results   Component Value Date    INR 1.20 05/16/2021    APTT 31.3 05/05/2021       Allergies & Home Medications   Personally reviewed in Epic    Continuous Infusions:       Scheduled Medications:    budesonide  0.5 mg Nebulization BID (RT)    buprenorphine-naloxone  1 tablet Sublingual Q8H SCH    Cefepime  2 g Intravenous Q8H    enoxaparin (LOVENOX) injection  40 mg Subcutaneous Nightly    ferrous sulfate  325 mg Oral Every other day    gabapentin  900 mg Oral TID    ipratropium-albuteroL  3 mL Nebulization Q6H (RT)    levoFLOXacin  750 mg Intravenous Q24H    lidocaine  1 patch Transdermal Daily    predniSONE  40 mg Oral Daily    sodium chloride  5 mL Nebulization QID    tobramycin (PF)  300 mg Nebulization BID (RT)       PRN medications:  albuterol    Charlyn Minerva, MD  Internal Medicine Resident, PGY-2

## 2022-07-05 NOTE — Unmapped (Incomplete)
Problem: Self-Care Deficit  Goal: Improved Ability to Complete Activities of Daily Living  Outcome: Ongoing - Unchanged     Problem: Fall Injury Risk  Goal: Absence of Fall and Fall-Related Injury  Outcome: Ongoing - Unchanged     Problem: Skin Injury Risk Increased  Goal: Skin Health and Integrity  Outcome: Ongoing - Unchanged     Problem: Adult Inpatient Plan of Care  Goal: Plan of Care Review  Outcome: Ongoing - Unchanged  Goal: Patient-Specific Goal (Individualized)  Outcome: Ongoing - Unchanged  Goal: Absence of Hospital-Acquired Illness or Injury  Outcome: Ongoing - Unchanged  Goal: Optimal Comfort and Wellbeing  Outcome: Ongoing - Unchanged  Goal: Readiness for Transition of Care  Outcome: Ongoing - Unchanged  Goal: Rounds/Family Conference  Outcome: Ongoing - Unchanged     Problem: Infection  Goal: Absence of Infection Signs and Symptoms  Outcome: Ongoing - Unchanged     Problem: Impaired Wound Healing  Goal: Optimal Wound Healing  Outcome: Ongoing - Unchanged     Problem: Infection (Pneumonia)  Goal: Resolution of Infection Signs and Symptoms  Outcome: Ongoing - Unchanged     Problem: Respiratory Compromise (Pneumonia)  Goal: Effective Oxygenation and Ventilation  Outcome: Ongoing - Unchanged

## 2022-07-06 LAB — BASIC METABOLIC PANEL
ANION GAP: 4 mmol/L — ABNORMAL LOW (ref 5–14)
BLOOD UREA NITROGEN: 18 mg/dL (ref 9–23)
BUN / CREAT RATIO: 24
CALCIUM: 9.1 mg/dL (ref 8.7–10.4)
CHLORIDE: 103 mmol/L (ref 98–107)
CO2: 31.8 mmol/L — ABNORMAL HIGH (ref 20.0–31.0)
CREATININE: 0.76 mg/dL
EGFR CKD-EPI (2021) MALE: >90 mL/min/{1.73_m2} (ref >=60)
GLUCOSE RANDOM: 103 mg/dL (ref 70–179)
POTASSIUM: 4.4 mmol/L (ref 3.4–4.8)
SODIUM: 139 mmol/L (ref 135–145)

## 2022-07-06 LAB — CBC
HEMATOCRIT: 27.7 % — ABNORMAL LOW (ref 39.0–48.0)
HEMOGLOBIN: 8.7 g/dL — ABNORMAL LOW (ref 12.9–16.5)
MEAN CORPUSCULAR HEMOGLOBIN CONC: 31.6 g/dL — ABNORMAL LOW (ref 32.0–36.0)
MEAN CORPUSCULAR HEMOGLOBIN: 27.1 pg (ref 25.9–32.4)
MEAN CORPUSCULAR VOLUME: 85.8 fL (ref 77.6–95.7)
MEAN PLATELET VOLUME: 7.8 fL (ref 6.8–10.7)
PLATELET COUNT: 336 10*9/L (ref 150–450)
RED BLOOD CELL COUNT: 3.22 10*12/L — ABNORMAL LOW (ref 4.26–5.60)
RED CELL DISTRIBUTION WIDTH: 15.6 % — ABNORMAL HIGH (ref 12.2–15.2)
WBC ADJUSTED: 13.5 10*9/L — ABNORMAL HIGH (ref 3.6–11.2)

## 2022-07-06 LAB — BLOOD GAS, VENOUS
BASE EXCESS VENOUS: 6.7 — ABNORMAL HIGH (ref -2.0–2.0)
HCO3 VENOUS: 29 mmol/L — ABNORMAL HIGH (ref 22–27)
O2 SATURATION VENOUS: 35.1 % — ABNORMAL LOW (ref 40.0–85.0)
PCO2 VENOUS: 65 mmHg — ABNORMAL HIGH (ref 40–60)
PH VENOUS: 7.32 (ref 7.32–7.43)
PO2 VENOUS: 24 mmHg — ABNORMAL LOW (ref 35–40)

## 2022-07-06 LAB — MAGNESIUM: MAGNESIUM: 2.1 mg/dL (ref 1.6–2.6)

## 2022-07-06 MED ADMIN — gabapentin (NEURONTIN) capsule 900 mg: 900 mg | ORAL | @ 03:00:00

## 2022-07-06 MED ADMIN — gabapentin (NEURONTIN) capsule 900 mg: 900 mg | ORAL | @ 18:00:00

## 2022-07-06 MED ADMIN — levoFLOXacin (LEVAQUIN) 750 mg/150 mL IVPB 750 mg: 750 mg | INTRAVENOUS | @ 03:00:00 | Stop: 2022-07-09

## 2022-07-06 MED ADMIN — enoxaparin (LOVENOX) syringe 40 mg: 40 mg | SUBCUTANEOUS | @ 03:00:00

## 2022-07-06 MED ADMIN — buprenorphine-naloxone (SUBOXONE) 8-2 mg SL tablet 8 mg of buprenorphine: 1 | SUBLINGUAL | @ 09:00:00

## 2022-07-06 MED ADMIN — ipratropium-albuteroL (DUO-NEB) 0.5-2.5 mg/3 mL nebulizer solution 3 mL: 3 mL | RESPIRATORY_TRACT | @ 13:00:00

## 2022-07-06 MED ADMIN — buprenorphine-naloxone (SUBOXONE) 8-2 mg SL tablet 8 mg of buprenorphine: 1 | SUBLINGUAL | @ 03:00:00

## 2022-07-06 MED ADMIN — gabapentin (NEURONTIN) capsule 900 mg: 900 mg | ORAL | @ 13:00:00

## 2022-07-06 MED ADMIN — sodium chloride 10 % NEBULIZER solution 5 mL: 5 mL | RESPIRATORY_TRACT | @ 13:00:00

## 2022-07-06 MED ADMIN — ferrous sulfate tablet 325 mg: 325 mg | ORAL | @ 13:00:00

## 2022-07-06 MED ADMIN — cefepime (MAXIPIME) 2 g in sodium chloride 0.9 % (NS) 100 mL IVPB-MBP: 2 g | INTRAVENOUS | @ 09:00:00 | Stop: 2022-07-09

## 2022-07-06 MED ADMIN — predniSONE (DELTASONE) tablet 40 mg: 40 mg | ORAL | @ 13:00:00 | Stop: 2022-07-08

## 2022-07-06 MED ADMIN — cefepime (MAXIPIME) 2 g in sodium chloride 0.9 % (NS) 100 mL IVPB-MBP: 2 g | INTRAVENOUS | @ 03:00:00 | Stop: 2022-07-09

## 2022-07-06 MED ADMIN — budesonide (PULMICORT) nebulizer solution 0.5 mg: .5 mg | RESPIRATORY_TRACT | @ 13:00:00

## 2022-07-06 MED ADMIN — buprenorphine-naloxone (SUBOXONE) 8-2 mg SL tablet 8 mg of buprenorphine: 1 | SUBLINGUAL | @ 18:00:00

## 2022-07-06 MED ADMIN — sodium chloride 10 % NEBULIZER solution 5 mL: 5 mL | RESPIRATORY_TRACT | @ 02:00:00

## 2022-07-06 MED ADMIN — sodium chloride 10 % NEBULIZER solution 5 mL: 5 mL | RESPIRATORY_TRACT | @ 08:00:00

## 2022-07-06 MED ADMIN — ipratropium-albuteroL (DUO-NEB) 0.5-2.5 mg/3 mL nebulizer solution 3 mL: 3 mL | RESPIRATORY_TRACT | @ 08:00:00

## 2022-07-06 MED ADMIN — ipratropium-albuteroL (DUO-NEB) 0.5-2.5 mg/3 mL nebulizer solution 3 mL: 3 mL | RESPIRATORY_TRACT | @ 20:00:00

## 2022-07-06 MED ADMIN — ipratropium-albuteroL (DUO-NEB) 0.5-2.5 mg/3 mL nebulizer solution 3 mL: 3 mL | RESPIRATORY_TRACT | @ 02:00:00

## 2022-07-06 MED ADMIN — lidocaine (LIDODERM) 5 % patch 1 patch: 1 | TRANSDERMAL | @ 13:00:00

## 2022-07-06 MED ADMIN — cefepime (MAXIPIME) 2 g in sodium chloride 0.9 % (NS) 100 mL IVPB-MBP: 2 g | INTRAVENOUS | @ 16:00:00 | Stop: 2022-07-09

## 2022-07-06 MED ADMIN — sodium chloride 10 % NEBULIZER solution 5 mL: 5 mL | RESPIRATORY_TRACT | @ 20:00:00

## 2022-07-06 NOTE — Unmapped (Signed)
Family Medicine Inpatient Service    Progress Note    Team: Family Medicine [facility] (pgr [phone])    Hospital Day: 3    ASSESSMENT / PLAN:   Kristopher Obrien is a 71 y.o. male with pmh COPD with bronchiectasis (last CTA 05/10/22) on 4L O2, tobacco use disorder, opioid use disorder, and MDR resistant pseudomonas PNA who presents with dyspnea.     # Acute COPD Exacerbation - Severe COPD and Bronchiectasis - Hx MDR Pseudomonal PNA  Patient with hx of hospitalizations for COPD exacerbations, most recently from 7/5-7/14 in which he grew MDR Achromobacter species. This exacerbation likely 2/2 pneumonia given patient's past history and current CXR findings with mid-lower left lung opacities.  Pt feels improved today. Pt tried Life2000 system with much improvement in breathing. LRCx unremarkable.   -Levofloxacin 750mg  daily IV (7/27- 8/10), cefepime 2g IV q8h for broader PsA coverage(7/28-8/4)  -consult pulmonology, appreciate recs   -Continue Levaquin for total of 14 days and Cefepime for total of 7 days.  Patient should stay in the hospital till he completes 7-day course of cefepime and then can be discharged with remaining 7 days of Levaquin to be completed at home.  -Continue Pulmicort 0.5 mg BID, to be also prescribed at discharge  -Continue DuoNeb Q6H, to be also prescribed at discharge for home regimen, albuterol Q4H PRN  -Continue prednisone 40 mg for total of 5 days, then can taper down to 30 mg for 3 days, 20 for 3 days, 10 for 3 days and 5 for 3 days then discontinue then.  -Continue airway clearance with DuoNeb QID, hypertonic saline 10% QID, and Aerobika QID all of which should be prescribed at discharge and make sure he has them at home.    -daily bmp, mg, vbg     # Opioid Use Disorder  - Continue home suboxone     #Iron Deficiency Anemia  Last colonoscopy in 08/09/2020, found one advanced adenomatous polyp and GI recommended repeat colonoscopy in 3 years.   -Daily cbc   -Continue home iron supplementation # Checklist:  - IVF None  - Tubes/Lines/Drains: PIV  - Diet Regular  - Bowel Regimen: No indication for a bowel regimen at this time  - DVT: SQ Lovenox  - Code Status:   Orders Placed This Encounter   Procedures    DNR (Do Not Resuscitate) and DNI (Do Not Intubate)     Standing Status:   Standing     Number of Occurrences:   1     - Dispo: Floor    [ ]  Anticipated Discharge Location: Home - pt lives with daughter  [ ]  PT/OT/DME: No needs anticipated  [ ]  CM/SW needs: None anticipated  [ ]  Meds/Rx:  Not yet prescribed. No special med needs  [ ]  Teaching: None anticipated  [ ]  Follow up appt: Appointment needed  [ ]  Excuse letter: Needed  [ ]  Transport: Private Needed    SUBJECTIVE:  Interval events: Pt feeling much improved today. He is excited to try Life2000 machine today.       REVIEW OF SYSTEMS:  Pertinent positives and negatives per HPI. A complete review of systems otherwise negative.    PHYSICAL EXAM:      Intake/Output Summary (Last 24 hours) at 07/06/2022 1358  Last data filed at 07/06/2022 1300  Gross per 24 hour   Intake 3290 ml   Output 2900 ml   Net 390 ml       Recent Vitals:  Vitals:    07/06/22 0822   BP:    Pulse: 107   Resp: 12   Temp:    SpO2: 94%       GEN: chronically ill appearing, lying in bed, NAD   HEENT: NCAT, No scleral icterus. Conjunctiva non-erythematous. MMM.  CV: Tachycardic rate and regular rhythm. No murmurs/rubs/gallops.  Pulm: Lungs with diffuse crackles worse at bases, no wheezing,   Abd: Flat.  Nontender. No guarding, rebound.  Normoactive bowel sounds.    Neuro: A&O x 3. No focal deficits.  Ext: No peripheral edema.  Palpable distal pulses.  Skin: No rashes or skin lesions.   Psych: Normal mood/affect      LABS/ STUDIES:    All imaging, laboratory studies, and other pertinent tests including electrocardiography within the last 24 hours were reviewed and are summarized within the assessment and plan.     NUTRITION:                           Rivka Spring, MD,  Family Med PGY2

## 2022-07-06 NOTE — Unmapped (Addendum)
General Pulmonary Team Follow Up Consult Note     Date of Service: 07/06/2022  Requesting Physician: Doran Durand, MD   Requesting Service: Family Medicine [facility])  Reason for consultation: Comprehensive evaluation of  COPD vs Bronchiectasis exacerbation .    Hospital Problems:  Principal Problem:    COPD exacerbation (CMS-HCC)  Active Problems:    Stage 4 very severe COPD by GOLD classification (CMS-HCC)    Pulmonary nodule    Bronchiectasis with acute exacerbation (CMS-HCC)    Chronic pain due to trauma    Recurrent pneumonia    Dyspnea and respiratory abnormalities    History of MDR Pseudomonas aeruginosa infection    History of smoking greater than 50 pack years    Buprenorphine dependence (CMS-HCC)      HPI: Kristopher Obrien is a 71 y.o. male with a past medical history significant pertinent for GOLD 4 Group E COPD (last PFTs 07/27/20) on 4 L [place] at home, bronchiectasis complicated by multiple prior exacerbations (last discharged 06/20/22 from [facility]) with prior cultures growing multi-drug-resistant PsA and MDR Achromobacter, prior pulmonary hypertension by echocardiography (2021), history of tobacco use disorder (quit 2-3 years ago, 50+ pk-yrs), opioid use disorder, who presented 7/27 with dyspnea.     Problems addressed during this consult include acute hypercapneic respiratory failure acute hypoxic respiratory failure acute on chronic respiratory failure bronchiectasis COPD exacerbation history of MDR Pseudomonas infection, tobacco use disorder  . Based on these problems, the patient has moderate risk of morbidity/mortality which is commensurate w their risk of management options described below in the recommendations.    Assessment      Interval Events: No major events overnight.  Patient continues to require 3 to 4 L of oxygen by nasal cannula.  He used the BiPAP overnight.  Awaiting trial of life 2000 in the hospital today.    Impression:  I personally reviewed most recent pertinent labs, imaging and micro data, from 07/06/2022, which are white count 13.5 down trended from 16.3, hemoglobin 8.7, sodium 139, creatinine 1.76, venous pH 7.32, PCO2 65, lower respiratory culture showing oropharyngeal flora.      Recommendations     #Acute COPD Exacerbation vs Bronchiectasis Exacerbation 2/2 superimposed LLL pneumonia   #GOLD Stage E COPD  #Bronchiectasis c/b history of MDR PsA and MDR Achromobacter   Patient has had multiple hospitalizations for COPD vs Bronchiectasis exacerbations, with his most recent from 7/5-7/14.  CXR consistent with LLL pneumonia, which is likely exacerbating factor. Patient was restarted on Levofloxacin on admission given prior susceptibility and had Cefepime added for double GNR coverage to include PsA. RPP negative. LRCx showing OP flora.  Prior history of MDR Pseudomonas and Achromobacter.    -Continue Levaquin for total of 14 days and Cefepime for total of 7 days.  Patient should stay in the hospital till he completes 7-day course of cefepime and then can be discharged with remaining 7 days of Levaquin to be completed at home.  -Continue Pulmicort 0.5 mg BID, to be also prescribed at discharge  -Continue DuoNeb Q6H, to be also prescribed at discharge for home regimen, albuterol Q4H PRN  -Continue prednisone 40 mg for total of 5 days, then can taper down to 30 mg for 3 days, 20 for 3 days, 10 for 3 days and 5 for 3 days then discontinue then.  -Continue airway clearance with DuoNeb QID, hypertonic saline 10% QID, and Aerobika QID all of which should be prescribed at discharge and make sure he has them  at home.  -Wean oxygen via [place] to goal SpO2 88-92%  -Hold inhaled Tobramycin while in acute bronchiectasis exacerbation and in setting of double GNR coverage. Plan to restart upon discharge for 28 day course.  -Nocturnal BiPAP  -Patient should trial the life 2000, and should be set up at home prior to discharge.  Would also recommend prescribing him BiPAP to be done nightly given his chronic hypercapnia.  -Team needs to contact his wound/B[place], number provided, 1-2 days prior to discharge so that they can set her up at home by the time he reaches.    Please do not hesitate to page [phone] [facility] consult) with questions. We appreciate the opportunity to assist in the care of this patient. We look forward to following with you.    Darnelle Bos, MD    Subjective & Objective     Vitals - past 24 hours  Temp:  [36.3 ??C (97.3 ??F)-36.6 ??C (97.9 ??F)] 36.6 ??C (97.9 ??F)  Heart Rate:  [73-108] 107  SpO2 Pulse:  [71-117] 117  Resp:  [9-22] 12  BP: (90-112)/(45-65) 90/45  FiO2 (%):  [40 %] 40 %  SpO2:  [94 %-100 %] 94 % Intake/Output  I/O last 3 completed shifts:  In: 4400 [P.O.:3600; IV Piggyback:800]  Out: 5075 [Urine:5075]      Pertinent exam findings:     Gen: Patient laying in bed, awake, alert, oriented to time place and person, mild pallor, no icterus,  cyanosis, mild clubbing.  Eyes: Pupils equal round and reacting light bilaterally  Head neck ENT: No JVD, no lymphadenopathy-cervical or supraclavicular, no thyromegaly, moist mucosa, normal oropharynx  CVS: S1-S2 heard normally, no murmurs appreciated, no rubs or gallops  Respiratory: Distant breath sounds bilaterally with bilateral expiratory wheeze in mid and lower zones  Abdomen: Soft, nontender, nondistended, no organomegaly, bowel sounds present  Neuro: Nonfocal neurological exam, no sensory deficits, cranial nerves grossly intact  Skin extremities: No rash, no pedal edema    Relevant Imaging:  - CXR on 07/03/22 revealed increased opacity in the mid to lower left lung superimposed on chronic parenchymal changes, raising suspicion for acute pneumonia.   - CT chest without contrast on 05/16/22 revealed stable multifocal consolidation related to prior episodes of infection or aspiration with no acute consolidation. Fixed stricture bronchus intermedius without change. There are secretions at this level. This may be related to prior infection or aspiration. Significant coronary artery and aortic valve calcification. Correlate cardiac echo as indicated to assess for possible aortic stenosis. Extensive centrilobular and paraseptal emphysema.    Arterial Blood Gas:   No results in the last day     Venous Blood Gas:   Recent Labs   Lab Units 07/06/22  0609   PH VEN  7.32   PCO2 VEN mm Hg 65*   PO2 VEN mm Hg 24*   HCO3 VEN mmol/L 29*   BASE EXC VEN  6.7*   O2 SAT VEN % 35.1*        Cultures:  Lower Respiratory Culture (no units)   Date Value   07/04/2022 OROPHARYNGEAL FLORA ISOLATED   06/15/2022 2+ Achromobacter species (A)   06/15/2022 3+ Oropharyngeal Flora Isolated     WBC (10*9/L)   Date Value   07/06/2022 13.5 (H)          Other Labs:  Lab Results   Component Value Date    WBC 13.5 (H) 07/06/2022    HGB 8.7 (L) 07/06/2022    HCT 27.7 (L)  07/06/2022    PLT 336 07/06/2022     Lab Results   Component Value Date    NA 139 07/06/2022    K 4.4 07/06/2022    CL 103 07/06/2022    CO2 31.8 (H) 07/06/2022    BUN 18 07/06/2022    CREATININE 0.76 07/06/2022    GLU 103 07/06/2022    CALCIUM 9.1 07/06/2022    MG 2.1 07/06/2022    PHOS 2.5 05/07/2022     Lab Results   Component Value Date    BILITOT 0.2 (L) 07/03/2022    BILIDIR <0.10 11/03/2021    PROT 7.5 07/03/2022    ALBUMIN 2.6 (L) 07/03/2022    ALT <7 (L) 07/03/2022    AST 12 07/03/2022    ALKPHOS 104 07/03/2022     Lab Results   Component Value Date    INR 1.20 05/16/2021    APTT 31.3 05/05/2021       Allergies & Home Medications   Personally reviewed in Epic    Continuous Infusions:       Scheduled Medications:   ??? budesonide  0.5 mg Nebulization BID (RT)   ??? buprenorphine-naloxone  1 tablet Sublingual Q8H SCH   ??? Cefepime  2 g Intravenous Q8H   ??? enoxaparin (LOVENOX) injection  40 mg Subcutaneous Nightly   ??? ferrous sulfate  325 mg Oral Every other day   ??? gabapentin  900 mg Oral TID   ??? ipratropium-albuteroL  3 mL Nebulization Q6H (RT)   ??? levoFLOXacin  750 mg Intravenous Q24H   ??? lidocaine  1 patch Transdermal Daily   ??? predniSONE  40 mg Oral Daily   ??? sodium chloride  5 mL Nebulization QID       PRN medications:  albuterol

## 2022-07-06 NOTE — Unmapped (Signed)
Problem: Respiratory Compromise (Pneumonia)  Goal: Effective Oxygenation and Ventilation  Outcome: Ongoing - Unchanged     Problem: Adjustment to Illness COPD (Chronic Obstructive Pulmonary Disease)  Goal: Optimal Chronic Illness Coping  Outcome: Ongoing - Unchanged     Problem: Functional Ability Impaired COPD (Chronic Obstructive Pulmonary Disease)  Goal: Optimal Level of Functional Independence  Outcome: Ongoing - Unchanged     Problem: Infection COPD (Chronic Obstructive Pulmonary Disease)  Goal: Absence of Infection Signs and Symptoms  Outcome: Ongoing - Unchanged     Problem: Respiratory Compromise COPD (Chronic Obstructive Pulmonary Disease)  Goal: Effective Oxygenation and Ventilation  Outcome: Ongoing - Unchanged     Problem: Asthma Comorbidity  Goal: Maintenance of Asthma Control  Outcome: Ongoing - Unchanged   All scheduled meds and airway clerance done as ordered.Received on nasal cannula and placed on bipap at hour of sleep.No distress noted this shift

## 2022-07-06 NOTE — Unmapped (Signed)
07/06/22 0822   Vitals   Heart Rate 107   Resp 12   SpO2 94 %   SpO2 Sensor Type Disposable Sensor   Oxygen Therapy/Pulse Ox   O2 Device Nasal cannula   O2 Therapy Oxygen   O2 Flow Rate (L/min) 5 L/min  (per patient request)   FiO2 (%) 40 %   $$ Pulse Ox Charge Yes   ROX Index   ROX Index Score 19.58

## 2022-07-06 NOTE — Unmapped (Cosign Needed)
Family Medicine Inpatient Service    Progress Note    Team: Family Medicine [facility] Si (pgr [phone])    Hospital Day: 2    ASSESSMENT / PLAN:   Kristopher Obrien is a 71 y.o. male with pmh COPD with bronchiectasis (last CTA 05/10/22) on 4L O2, tobacco use disorder, opioid use disorder, and MDR resistant pseudomonas PNA who presents with dyspnea.     # Acute COPD Exacerbation - Severe COPD and Bronchiectasis - Hx MDR Pseudomonal PNA  Patient with hx of hospitalizations for COPD exacerbations, most recently from 7/5-7/14 in which he grew MDR Achromobacter species. This exacerbation likely 2/2 pneumonia given patient's past history and current CXR findings with mid-lower left lung opacities.  Pt feels improved today. Pt would like to go home on home hospice. Pulm team working to get pt Life2000 system before dc. Likely DC tomorrow. Discontinued tobramycin nebs while inpatient to be continued on discharge.  -Levofloxacin 750mg  daily IV, cefepime 2g IV q8h for broader PsA coverage  -consult pulmonology  -fu lower respiratory culture, RPP  -duonebs q6h, airway clearance with aerobicka, hypertonic nebs 10%  -prednisone 40mg  for 4 day course starting tomorrow 7/28   -daily bmp, mg, vbg     # Opioid Use Disorder  - Continue home suboxone     #Iron Deficiency Anemia  Last colonoscopy in 08/09/2020, found one advanced adenomatous polyp and GI recommended repeat colonoscopy in 3 years.   -Daily cbc   -Continue home iron supplementation     # Checklist:  - IVF None  - Tubes/Lines/Drains: PIV  - Diet Regular  - Bowel Regimen: No indication for a bowel regimen at this time  - DVT: SQ Lovenox  - Code Status:   Orders Placed This Encounter   Procedures    DNR (Do Not Resuscitate) and DNI (Do Not Intubate)     Standing Status:   Standing     Number of Occurrences:   1     - Dispo: Floor    [ ]  Anticipated Discharge Location: Home - pt lives with daughter  [ ]  PT/OT/DME: No needs anticipated  [ ]  CM/SW needs: None anticipated  [ ]  Meds/Rx:  Not yet prescribed. No special med needs  [ ]  Teaching: None anticipated  [ ]  Follow up appt: Appointment needed  [ ]  Excuse letter: Needed  [ ]  Transport: Private Needed    SUBJECTIVE:  Interval events: Pt feeling much improved today. No chest pain. No swelling. Pt had discussion with family, would like to be able to go home on hospice and not return to the hospital. Just don't want to struggle or feel like I'm dying.      REVIEW OF SYSTEMS:  Pertinent positives and negatives per HPI. A complete review of systems otherwise negative.    PHYSICAL EXAM:      Intake/Output Summary (Last 24 hours) at 07/05/2022 1737  Last data filed at 07/05/2022 1500  Gross per 24 hour   Intake 3110 ml   Output 3875 ml   Net -765 ml         Recent Vitals:  Vitals:    07/05/22 1200   BP:    Pulse:    Resp:    Temp: 36.6 ??C (97.9 ??F)   SpO2:        GEN: chronically ill appearing, lying in bed, NAD   HEENT: NCAT, No scleral icterus. Conjunctiva non-erythematous. MMM.  CV: Tachycardic rate and regular rhythm. No murmurs/rubs/gallops.  Pulm: Mildly increased  work of breathing on 3L [place] (home O2). Lungs with diffuse crackles worse at bases, no wheezing.  Abd: Flat.  Nontender. No guarding, rebound.  Normoactive bowel sounds.    Neuro: A&O x 3. No focal deficits.  Ext: No peripheral edema.  Palpable distal pulses.  Skin: No rashes or skin lesions.   Psych: Normal mood/affect      LABS/ STUDIES:    All imaging, laboratory studies, and other pertinent tests including electrocardiography within the last 24 hours were reviewed and are summarized within the assessment and plan.     NUTRITION:                           Kirby Crigler, MD, MPH  Family Med PGY2

## 2022-07-06 NOTE — Unmapped (Signed)
Problem: Self-Care Deficit  Goal: Improved Ability to Complete Activities of Daily Living  07/06/2022 0600 by Arby Barrette Jamica Woodyard, RN  Outcome: Progressing  07/06/2022 0600 by Stanton Kidney, RN  Outcome: Progressing     Problem: Fall Injury Risk  Goal: Absence of Fall and Fall-Related Injury  07/06/2022 0600 by Arby Barrette Norfleet Capers, RN  Outcome: Progressing  07/06/2022 0600 by Arby Barrette Ailah Barna, RN  Outcome: Progressing  Intervention: Promote Injury-Free Environment  Recent Flowsheet Documentation  Taken 07/05/2022 2000 by Arby Barrette Ladeana Laplant, RN  Safety Interventions:   environmental modification   bed alarm   fall reduction program maintained   lighting adjusted for tasks/safety     Problem: Skin Injury Risk Increased  Goal: Skin Health and Integrity  07/06/2022 0600 by Arby Barrette Izabelle Daus, RN  Outcome: Progressing  07/06/2022 0600 by Arby Barrette Aldon Hengst, RN  Outcome: Progressing  Intervention: Optimize Skin Protection  Recent Flowsheet Documentation  Taken 07/06/2022 0200 by Stanton Kidney, RN  Head of Bed [facility]) Positioning: HOB at 30-45 degrees  Taken 07/06/2022 0000 by Stanton Kidney, RN  Head of Bed [facility]) Positioning: HOB at 30-45 degrees  Taken 07/05/2022 2200 by Arby Barrette Mansour Balboa, RN  Head of Bed [facility]) Positioning: HOB at 30-45 degrees  Taken 07/05/2022 2000 by Arby Barrette Tramon Crescenzo, RN  Pressure Reduction Techniques:   frequent weight shift encouraged   weight shift assistance provided  Head of Bed (HOB) Positioning: HOB at 30-45 degrees  Pressure Reduction Devices:   pressure-redistributing mattress utilized   positioning supports utilized  Skin Protection:   adhesive use limited   tubing/devices free from skin contact   transparent dressing maintained   skin-to-skin areas padded   skin-to-device areas padded   incontinence pads utilized     Problem: Adult Inpatient Plan of Care  Goal: Plan of Care Review  07/06/2022 0600 by Arby Barrette Marquis Down, RN  Outcome: Progressing  07/06/2022 0600 by Arby Barrette Sagrario Lineberry, RN  Outcome: Progressing  Goal: Patient-Specific Goal (Individualized)  07/06/2022 0600 by Stanton Kidney, RN  Outcome: Progressing  07/06/2022 0600 by Arby Barrette Lenville Hibberd, RN  Outcome: Progressing  Goal: Absence of Hospital-Acquired Illness or Injury  07/06/2022 0600 by Arby Barrette Helton Oleson, RN  Outcome: Progressing  07/06/2022 0600 by Arby Barrette Joleigh Mineau, RN  Outcome: Progressing  Intervention: Identify and Manage Fall Risk  Recent Flowsheet Documentation  Taken 07/05/2022 2000 by Stanton Kidney, RN  Safety Interventions:   environmental modification   bed alarm   fall reduction program maintained   lighting adjusted for tasks/safety  Intervention: Prevent Skin Injury  Recent Flowsheet Documentation  Taken 07/05/2022 2000 by Arby Barrette Shawneen Deetz, RN  Skin Protection:   adhesive use limited   tubing/devices free from skin contact   transparent dressing maintained   skin-to-skin areas padded   skin-to-device areas padded   incontinence pads utilized  Intervention: Prevent and Manage VTE (Venous Thromboembolism) Risk  Recent Flowsheet Documentation  Taken 07/06/2022 0400 by Stanton Kidney, RN  Activity Management: activity adjusted per tolerance  Taken 07/06/2022 0200 by Arby Barrette Emeree Mahler, RN  Activity Management: bedrest  Taken 07/06/2022 0000 by Arby Barrette Liyana Suniga, RN  Activity Management:   activity adjusted per tolerance   dorsiflexion/plantar flexion performed   activity encouraged   bedrest  Taken 07/05/2022 2200 by Arby Barrette Remedios Mckone, RN  Activity Management:   activity adjusted per tolerance   activity encouraged   dorsiflexion/plantar flexion performed  Range of Motion: Bilateral Upper and Lower Extremities  Taken 07/05/2022 2000 by Arby Barrette Leno Mathes, RN  Activity Management:   activity adjusted per tolerance   activity encouraged   dorsiflexion/plantar flexion performed  VTE Prevention/Management: (lovenox) other (see comments)  Range of Motion: Bilateral Upper and Lower Extremities  Intervention: Prevent Infection  Recent Flowsheet Documentation  Taken 07/05/2022 2000 by Arby Barrette Wandra Babin, RN  Infection Prevention:   single patient room provided   rest/sleep promoted   personal protective equipment utilized   hand hygiene promoted   environmental surveillance performed  Goal: Optimal Comfort and Wellbeing  07/06/2022 0600 by Arby Barrette Rohit Deloria, RN  Outcome: Progressing  07/06/2022 0600 by Arby Barrette Troye Hiemstra, RN  Outcome: Progressing  Goal: Readiness for Transition of Care  07/06/2022 0600 by Arby Barrette Vanecia Limpert, RN  Outcome: Progressing  07/06/2022 0600 by Arby Barrette Nikolay Demetriou, RN  Outcome: Progressing  Goal: Rounds/Family Conference  07/06/2022 0600 by Stanton Kidney, RN  Outcome: Progressing  07/06/2022 0600 by Arby Barrette Asuka Dusseau, RN  Outcome: Progressing     Problem: Infection  Goal: Absence of Infection Signs and Symptoms  07/06/2022 0600 by Arby Barrette Klinton Candelas, RN  Outcome: Progressing  07/06/2022 0600 by Arby Barrette Eldoris Beiser, RN  Outcome: Progressing  Intervention: Prevent or Manage Infection  Recent Flowsheet Documentation  Taken 07/05/2022 2000 by Arby Barrette Ryott Rafferty, RN  Infection Management: aseptic technique maintained  Isolation Precautions: contact precautions maintained     Problem: Impaired Wound Healing  Goal: Optimal Wound Healing  07/06/2022 0600 by Arby Barrette Carzell Saldivar, RN  Outcome: Progressing  07/06/2022 0600 by Arby Barrette Myrian Botello, RN  Outcome: Progressing  Intervention: Promote Wound Healing  Recent Flowsheet Documentation  Taken 07/06/2022 0400 by Arby Barrette Dalbert Stillings, RN  Activity Management: activity adjusted per tolerance  Sleep/Rest Enhancement:   awakenings minimized   regular sleep/rest pattern promoted   relaxation techniques promoted  Taken 07/06/2022 0200 by Arby Barrette [place] Monds, RN  Activity Management: bedrest  Sleep/Rest Enhancement:   awakenings minimized   regular sleep/rest pattern promoted   relaxation techniques promoted  Taken 07/06/2022 0000 by Arby Barrette Mathew Storck, RN  Activity Management: activity adjusted per tolerance   dorsiflexion/plantar flexion performed   activity encouraged   bedrest  Sleep/Rest Enhancement:   awakenings minimized   consistent schedule promoted   regular sleep/rest pattern promoted   relaxation techniques promoted  Taken 07/05/2022 2200 by Arby Barrette Vasil Juhasz, RN  Activity Management:   activity adjusted per tolerance   activity encouraged   dorsiflexion/plantar flexion performed  Sleep/Rest Enhancement:   awakenings minimized   consistent schedule promoted   family presence promoted   regular sleep/rest pattern promoted   relaxation techniques promoted   natural light exposure provided   noise level reduced  Taken 07/05/2022 2000 by Arby Barrette Jelene Albano, RN  Activity Management:   activity adjusted per tolerance   activity encouraged   dorsiflexion/plantar flexion performed  Sleep/Rest Enhancement:   awakenings minimized   consistent schedule promoted   family presence promoted   regular sleep/rest pattern promoted   relaxation techniques promoted     Problem: Infection (Pneumonia)  Goal: Resolution of Infection Signs and Symptoms  07/06/2022 0600 by Arby Barrette Gianny Killman, RN  Outcome: Progressing  07/06/2022 0600 by Arby Barrette Clee Pandit, RN  Outcome: Progressing  Intervention: Prevent Infection Progression  Recent Flowsheet Documentation  Taken 07/05/2022 2000 by Arby Barrette Angelle Isais, RN  Infection Management: aseptic technique maintained  Isolation Precautions: contact precautions maintained     Problem: Respiratory Compromise (Pneumonia)  Goal: Effective Oxygenation and Ventilation  07/06/2022 0600 by Arby Barrette Ryle Buscemi, RN  Outcome: Progressing  07/06/2022 0600 by Arby Barrette Mikal Wisman, RN  Outcome: Progressing  Intervention: Optimize Oxygenation and Ventilation  Recent Flowsheet Documentation  Taken 07/06/2022 0200 by Arby Barrette Carey Lafon, RN  Head of Bed [facility]) Positioning: HOB at 30-45 degrees  Taken 07/06/2022 0000 by Stanton Kidney, RN  Head of Bed [facility]) Positioning: HOB at 30-45 degrees  Taken 07/05/2022 2200 by Arby Barrette Marvette Schamp, RN  Head of Bed [facility]) Positioning: HOB at 30-45 degrees  Taken 07/05/2022 2000 by Arby Barrette Leanny Moeckel, RN  Head of Bed [facility]) Positioning: HOB at 30-45 degrees     Problem: Adjustment to Illness COPD (Chronic Obstructive Pulmonary Disease)  Goal: Optimal Chronic Illness Coping  07/06/2022 0600 by Arby Barrette Lakysha Kossman, RN  Outcome: Progressing  07/06/2022 0600 by Arby Barrette Kamirah Shugrue, RN  Outcome: Progressing     Problem: Functional Ability Impaired COPD (Chronic Obstructive Pulmonary Disease)  Goal: Optimal Level of Functional Independence  07/06/2022 0600 by Arby Barrette Rola Lennon, RN  Outcome: Progressing  07/06/2022 0600 by Arby Barrette Nyna Chilton, RN  Outcome: Progressing  Intervention: Optimize Functional Ability  Recent Flowsheet Documentation  Taken 07/06/2022 0400 by Stanton Kidney, RN  Activity Management: activity adjusted per tolerance  Taken 07/06/2022 0200 by Arby Barrette Zaiah Eckerson, RN  Activity Management: bedrest  Taken 07/06/2022 0000 by Stanton Kidney, RN  Activity Management:   activity adjusted per tolerance   dorsiflexion/plantar flexion performed   activity encouraged   bedrest  Taken 07/05/2022 2200 by Arby Barrette Riley Hallum, RN  Activity Management:   activity adjusted per tolerance   activity encouraged   dorsiflexion/plantar flexion performed  Taken 07/05/2022 2000 by Arby Barrette Belkys Henault, RN  Activity Management:   activity adjusted per tolerance   activity encouraged   dorsiflexion/plantar flexion performed     Problem: Infection COPD (Chronic Obstructive Pulmonary Disease)  Goal: Absence of Infection Signs and Symptoms  07/06/2022 0600 by Arby Barrette Jeyren Danowski, RN  Outcome: Progressing  07/06/2022 0600 by Arby Barrette Jaxiel Kines, RN  Outcome: Progressing  Intervention: Prevent or Manage Infection  Recent Flowsheet Documentation  Taken 07/05/2022 2000 by Arby Barrette Julian Medina, RN  Infection Management: aseptic technique maintained  Isolation Precautions: contact precautions maintained     Problem: Oral Intake Inadequate COPD (Chronic Obstructive Pulmonary Disease)  Goal: Improved Nutrition Intake  07/06/2022 0600 by Arby Barrette Deaunte Dente, RN  Outcome: Progressing  07/06/2022 0600 by Arby Barrette Eyana Stolze, RN  Outcome: Progressing     Problem: Respiratory Compromise COPD (Chronic Obstructive Pulmonary Disease)  Goal: Effective Oxygenation and Ventilation  07/06/2022 0600 by Arby Barrette Quinesha Selinger, RN  Outcome: Progressing  07/06/2022 0600 by Arby Barrette Tamsin Nader, RN  Outcome: Progressing  Intervention: Promote Airway Secretion Clearance  Recent Flowsheet Documentation  Taken 07/06/2022 0400 by Arby Barrette Abbeygail Igoe, RN  Activity Management: activity adjusted per tolerance  Taken 07/06/2022 0200 by Arby Barrette Jasnoor Trussell, RN  Activity Management: bedrest  Taken 07/06/2022 0000 by Stanton Kidney, RN  Activity Management:   activity adjusted per tolerance   dorsiflexion/plantar flexion performed   activity encouraged   bedrest  Taken 07/05/2022 2200 by Arby Barrette Breslin Burklow, RN  Activity Management:   activity adjusted per tolerance   activity encouraged   dorsiflexion/plantar flexion performed  Taken 07/05/2022 2000 by Arby Barrette Amberleigh Gerken, RN  Activity Management:   activity adjusted per tolerance   activity encouraged   dorsiflexion/plantar flexion performed  Intervention: Optimize Oxygenation and Ventilation  Recent Flowsheet Documentation  Taken 07/06/2022  0200 by Stanton Kidney, RN  Head of Bed [facility]) Positioning: HOB at 30-45 degrees  Taken 07/06/2022 0000 by Stanton Kidney, RN  Head of Bed [facility]) Positioning: HOB at 30-45 degrees  Taken 07/05/2022 2200 by Arby Barrette Corrisa Gibby, RN  Head of Bed [facility]) Positioning: HOB at 30-45 degrees  Taken 07/05/2022 2000 by Arby Barrette Elivia Robotham, RN  Head of Bed [facility]) Positioning: HOB at 30-45 degrees     Problem: Asthma Comorbidity  Goal: Maintenance of Asthma Control  07/06/2022 0600 by Arby Barrette Loisann Roach, RN  Outcome: Progressing  07/06/2022 0600 by Arby Barrette Kamorah Nevils, RN  Outcome: Progressing

## 2022-07-07 LAB — BASIC METABOLIC PANEL
ANION GAP: 1 mmol/L — ABNORMAL LOW (ref 5–14)
BLOOD UREA NITROGEN: 16 mg/dL (ref 9–23)
BUN / CREAT RATIO: 24
CALCIUM: 9 mg/dL (ref 8.7–10.4)
CHLORIDE: 102 mmol/L (ref 98–107)
CO2: 35.9 mmol/L — ABNORMAL HIGH (ref 20.0–31.0)
CREATININE: 0.66 mg/dL
EGFR CKD-EPI (2021) MALE: >90 mL/min/{1.73_m2} (ref >=60)
GLUCOSE RANDOM: 87 mg/dL (ref 70–179)
POTASSIUM: 4 mmol/L (ref 3.4–4.8)
SODIUM: 139 mmol/L (ref 135–145)

## 2022-07-07 LAB — CBC
HEMATOCRIT: 29.1 % — ABNORMAL LOW (ref 39.0–48.0)
HEMOGLOBIN: 9.1 g/dL — ABNORMAL LOW (ref 12.9–16.5)
MEAN CORPUSCULAR HEMOGLOBIN CONC: 31.2 g/dL — ABNORMAL LOW (ref 32.0–36.0)
MEAN CORPUSCULAR HEMOGLOBIN: 26.8 pg (ref 25.9–32.4)
MEAN CORPUSCULAR VOLUME: 85.8 fL (ref 77.6–95.7)
MEAN PLATELET VOLUME: 7.4 fL (ref 6.8–10.7)
PLATELET COUNT: 343 10*9/L (ref 150–450)
RED BLOOD CELL COUNT: 3.39 10*12/L — ABNORMAL LOW (ref 4.26–5.60)
RED CELL DISTRIBUTION WIDTH: 15.6 % — ABNORMAL HIGH (ref 12.2–15.2)
WBC ADJUSTED: 15.2 10*9/L — ABNORMAL HIGH (ref 3.6–11.2)

## 2022-07-07 LAB — BLOOD GAS, VENOUS
BASE EXCESS VENOUS: 9.4 — ABNORMAL HIGH (ref -2.0–2.0)
HCO3 VENOUS: 31 mmol/L — ABNORMAL HIGH (ref 22–27)
O2 SATURATION VENOUS: 41.3 % (ref 40.0–85.0)
PCO2 VENOUS: 71 mmHg (ref 40–60)
PH VENOUS: 7.31 — ABNORMAL LOW (ref 7.32–7.43)
PO2 VENOUS: 26 mmHg — ABNORMAL LOW (ref 35–40)

## 2022-07-07 LAB — MAGNESIUM: MAGNESIUM: 2.1 mg/dL (ref 1.6–2.6)

## 2022-07-07 MED ADMIN — budesonide (PULMICORT) nebulizer solution 0.5 mg: .5 mg | RESPIRATORY_TRACT | @ 12:00:00

## 2022-07-07 MED ADMIN — enoxaparin (LOVENOX) syringe 40 mg: 40 mg | SUBCUTANEOUS | @ 01:00:00

## 2022-07-07 MED ADMIN — predniSONE (DELTASONE) tablet 40 mg: 40 mg | ORAL | @ 14:00:00 | Stop: 2022-07-07

## 2022-07-07 MED ADMIN — buprenorphine-naloxone (SUBOXONE) 8-2 mg SL tablet 8 mg of buprenorphine: 1 | SUBLINGUAL | @ 10:00:00

## 2022-07-07 MED ADMIN — cefepime (MAXIPIME) 2 g in sodium chloride 0.9 % (NS) 100 mL IVPB-MBP: 2 g | INTRAVENOUS | @ 01:00:00 | Stop: 2022-07-09

## 2022-07-07 MED ADMIN — ipratropium-albuteroL (DUO-NEB) 0.5-2.5 mg/3 mL nebulizer solution 3 mL: 3 mL | RESPIRATORY_TRACT | @ 19:00:00

## 2022-07-07 MED ADMIN — gabapentin (NEURONTIN) capsule 900 mg: 900 mg | ORAL | @ 18:00:00

## 2022-07-07 MED ADMIN — lidocaine (LIDODERM) 5 % patch 1 patch: 1 | TRANSDERMAL | @ 14:00:00

## 2022-07-07 MED ADMIN — sodium chloride 10 % NEBULIZER solution 5 mL: 5 mL | RESPIRATORY_TRACT | @ 09:00:00

## 2022-07-07 MED ADMIN — gabapentin (NEURONTIN) capsule 900 mg: 900 mg | ORAL | @ 01:00:00

## 2022-07-07 MED ADMIN — budesonide (PULMICORT) nebulizer solution 0.5 mg: .5 mg | RESPIRATORY_TRACT | @ 01:00:00

## 2022-07-07 MED ADMIN — buprenorphine-naloxone (SUBOXONE) 8-2 mg SL tablet 8 mg of buprenorphine: 1 | SUBLINGUAL | @ 02:00:00

## 2022-07-07 MED ADMIN — sodium chloride 10 % NEBULIZER solution 5 mL: 5 mL | RESPIRATORY_TRACT | @ 19:00:00

## 2022-07-07 MED ADMIN — cefepime (MAXIPIME) 2 g in sodium chloride 0.9 % (NS) 100 mL IVPB-MBP: 2 g | INTRAVENOUS | @ 18:00:00 | Stop: 2022-07-10

## 2022-07-07 MED ADMIN — sodium chloride 10 % NEBULIZER solution 5 mL: 5 mL | RESPIRATORY_TRACT | @ 12:00:00

## 2022-07-07 MED ADMIN — ipratropium-albuteroL (DUO-NEB) 0.5-2.5 mg/3 mL nebulizer solution 3 mL: 3 mL | RESPIRATORY_TRACT | @ 12:00:00

## 2022-07-07 MED ADMIN — gabapentin (NEURONTIN) capsule 900 mg: 900 mg | ORAL | @ 14:00:00

## 2022-07-07 MED ADMIN — cefepime (MAXIPIME) 2 g in sodium chloride 0.9 % (NS) 100 mL IVPB-MBP: 2 g | INTRAVENOUS | @ 10:00:00 | Stop: 2022-07-07

## 2022-07-07 MED ADMIN — buprenorphine-naloxone (SUBOXONE) 8-2 mg SL tablet 8 mg of buprenorphine: 1 | SUBLINGUAL | @ 18:00:00

## 2022-07-07 MED ADMIN — levoFLOXacin (LEVAQUIN) tablet 750 mg: 750 mg | ORAL | @ 18:00:00 | Stop: 2022-07-17

## 2022-07-07 MED ADMIN — sodium chloride 10 % NEBULIZER solution 5 mL: 5 mL | RESPIRATORY_TRACT | @ 01:00:00

## 2022-07-07 MED ADMIN — levoFLOXacin (LEVAQUIN) 750 mg/150 mL IVPB 750 mg: 750 mg | INTRAVENOUS | @ 01:00:00 | Stop: 2022-07-09

## 2022-07-07 MED ADMIN — ipratropium-albuteroL (DUO-NEB) 0.5-2.5 mg/3 mL nebulizer solution 3 mL: 3 mL | RESPIRATORY_TRACT | @ 01:00:00

## 2022-07-07 MED ADMIN — ipratropium-albuteroL (DUO-NEB) 0.5-2.5 mg/3 mL nebulizer solution 3 mL: 3 mL | RESPIRATORY_TRACT | @ 09:00:00

## 2022-07-07 NOTE — Unmapped (Cosign Needed)
Family Medicine Inpatient Service    Progress Note    Team: Family Medicine [place] [facility] (pgr [phone])    Hospital Day: 4    ASSESSMENT / PLAN:   Kristopher Obrien is a 71 y.o. male with pmh COPD with bronchiectasis (last CTA 05/10/22) on 4L O2, tobacco use disorder, opioid use disorder, and MDR resistant pseudomonas PNA who presents with dyspnea.     # Acute COPD Exacerbation - Severe COPD and Bronchiectasis - Hx MDR Pseudomonal PNA  Patient with hx of hospitalizations for COPD exacerbations, most recently from 7/5-7/14 in which he grew MDR Achromobacter species. This exacerbation likely 2/2 pneumonia given patient's past history and current CXR findings with mid-lower left lung opacities. LRCx unremarkable. Pt feels improved today. Pt tried Life2000 system with much improvement in breathing, though Life2000 may not be compatible with home hospice given that it is considered life-prolonging treatment. GOC ongoing.  -consult pulmonology, appreciate recs   -Continue Cefepime 2g q8h for total of 7 days (thru 8/3).   - Levaquin 750 mg PO daily for total of 14 days (thru 8/9), will be discharged with remaining days of Levaquin PO.  -Continue Pulmicort 0.5 mg BID, to be also prescribed at discharge  -Continue DuoNeb Q6H, to be also prescribed at discharge for home regimen, albuterol Q4H PRN  -Continue prednisone 40 mg for total of 5 days, then can taper down to 30 mg for 3 days, 20 for 3 days, 10 for 3 days and 5 for 3 days then discontinue.  -Continue airway clearance with DuoNeb QID, hypertonic saline 10% QID, and Aerobika QID all of which should be prescribed at discharge and make sure he has them at home.  -daily bmp, mg, vbg     # Opioid Use Disorder  - Continue home suboxone     #Iron Deficiency Anemia  Last colonoscopy in 08/09/2020, found one advanced adenomatous polyp and GI recommended repeat colonoscopy in 3 years.   -Daily cbc   -Continue home iron supplementation     # Checklist:  - IVF None  - Tubes/Lines/Drains: PIV  - Diet Regular  - Bowel Regimen: Miralax PRN  - DVT: SQ Lovenox  - Code Status:   Orders Placed This Encounter   Procedures    DNR (Do Not Resuscitate) and DNI (Do Not Intubate)     Standing Status:   Standing     Number of Occurrences:   1     - Dispo: Floor    [ ]  Anticipated Discharge Location: Home - pt lives with daughter  [ ]  PT/OT/DME: No needs anticipated  [ ]  CM/SW needs: None anticipated  [ ]  Meds/Rx:  Not yet prescribed. No special med needs  [ ]  Teaching: None anticipated  [ ]  Follow up appt: Appointment needed  [ ]  Excuse letter: Needed  [ ]  Transport: Private Needed    SUBJECTIVE:  Interval events: Pt feeling worse today than yesterday, more out of breath. Says the Life2000 felt really good yesterday, still wants to go home on hospice though.       REVIEW OF SYSTEMS:  Pertinent positives and negatives per HPI. A complete review of systems otherwise negative.    PHYSICAL EXAM:      Intake/Output Summary (Last 24 hours) at 07/07/2022 1459  Last data filed at 07/07/2022 1200  Gross per 24 hour   Intake 1090 ml   Output 3900 ml   Net -2810 ml         Recent Vitals:  Vitals:    07/07/22 1200   BP: 96/46   Pulse: 92   Resp: 8   Temp: 37 ??C (98.6 ??F)   SpO2: 100%       GEN: chronically ill appearing, lying in bed, NAD   HEENT: NCAT, No scleral icterus. Conjunctiva non-erythematous. MMM.  CV: Tachycardic rate and regular rhythm. No murmurs/rubs/gallops.  Pulm: Lungs with diffuse crackles, diffuse rhonchi, no wheezing.  Abd: Flat.  Nontender. No guarding, rebound.  Normoactive bowel sounds.    Neuro: A&O x 3. No focal deficits.  Ext: No peripheral edema.  Palpable distal pulses.  Skin: No rashes or skin lesions.   Psych: Normal mood/affect      LABS/ STUDIES:    All imaging, laboratory studies, and other pertinent tests including electrocardiography within the last 24 hours were reviewed and are summarized within the assessment and plan.     NUTRITION:                           Kristopher Crigler, MD,  Family Med PGY2

## 2022-07-07 NOTE — Unmapped (Signed)
Problem: Self-Care Deficit  Goal: Improved Ability to Complete Activities of Daily Living  Outcome: Progressing     Problem: Fall Injury Risk  Goal: Absence of Fall and Fall-Related Injury  Outcome: Progressing     Problem: Skin Injury Risk Increased  Goal: Skin Health and Integrity  Outcome: Progressing     Problem: Adult Inpatient Plan of Care  Goal: Plan of Care Review  Outcome: Progressing  Goal: Patient-Specific Goal (Individualized)  Outcome: Progressing  Goal: Absence of Hospital-Acquired Illness or Injury  Outcome: Progressing  Intervention: Prevent and Manage VTE (Venous Thromboembolism) Risk  Recent Flowsheet Documentation  Taken 07/06/2022 1600 by Nira Retort, RN  Activity Management: activity adjusted per tolerance  Range of Motion: Bilateral Upper and Lower Extremities  Goal: Optimal Comfort and Wellbeing  Outcome: Progressing  Goal: Readiness for Transition of Care  Outcome: Progressing  Goal: Rounds/Family Conference  Outcome: Progressing

## 2022-07-07 NOTE — Unmapped (Signed)
Problem: Respiratory Compromise (Pneumonia)  Goal: Effective Oxygenation and Ventilation  Outcome: Ongoing - Unchanged     Problem: Adjustment to Illness COPD (Chronic Obstructive Pulmonary Disease)  Goal: Optimal Chronic Illness Coping  Outcome: Ongoing - Unchanged     Problem: Infection COPD (Chronic Obstructive Pulmonary Disease)  Goal: Absence of Infection Signs and Symptoms  Outcome: Ongoing - Unchanged     Problem: Oral Intake Inadequate COPD (Chronic Obstructive Pulmonary Disease)  Goal: Improved Nutrition Intake  Outcome: Ongoing - Unchanged     Problem: Asthma Comorbidity  Goal: Maintenance of Asthma Control  Outcome: Ongoing - Unchanged     Problem: Obstructive Sleep Apnea Risk or Actual Comorbidity Management  Goal: Unobstructed Breathing During Sleep  Outcome: Ongoing - Unchanged  Intervention: Monitor and Manage Obstructive Sleep Apnea  Recent Flowsheet Documentation  Taken 07/06/2022 2340 by Jimmie Molly, RRT  NPPV/CPAP Maintenance:   mask secure   mask adjust  Patient remained on nasal cannula and refused use of bipap.Scheduled meds and airway clearance done as ordered. No distress noted this shift.

## 2022-07-07 NOTE — Unmapped (Signed)
General Pulmonary Team Follow Up Consult Note     Date of Service: 07/07/2022  Requesting Physician: Leitha Schuller, MD   Requesting Service: Family Medicine [facility])  Reason for consultation: Comprehensive evaluation of  COPD vs Bronchiectasis exacerbation .    Hospital Problems:  Principal Problem:    COPD exacerbation (CMS-HCC)  Active Problems:    Stage 4 very severe COPD by GOLD classification (CMS-HCC)    Pulmonary nodule    Bronchiectasis with acute exacerbation (CMS-HCC)    Chronic pain due to trauma    Recurrent pneumonia    Dyspnea and respiratory abnormalities    History of MDR Pseudomonas aeruginosa infection    History of smoking greater than 50 pack years    Buprenorphine dependence (CMS-HCC)      HPI: Kristopher Obrien is a 71 y.o. male with a past medical history significant pertinent for GOLD 4 Group E COPD (last PFTs 07/27/20) on 4 L [place] at home, bronchiectasis complicated by multiple prior exacerbations (last discharged 06/20/22 from [facility]) with prior cultures growing multi-drug-resistant PsA and MDR Achromobacter, prior pulmonary hypertension by echocardiography (2021), history of tobacco use disorder (quit 2-3 years ago, 50+ pk-yrs), opioid use disorder, who presented 7/27 with dyspnea.     Problems addressed during this consult include acute hypercapneic respiratory failure acute hypoxic respiratory failure acute on chronic respiratory failure bronchiectasis COPD exacerbation history of MDR Pseudomonas infection, tobacco use disorder  . Based on these problems, the patient has high risk of morbidity/mortality which is commensurate w their risk of management options described below in the recommendations.    Assessment      Interval Events: Overnight had episode of severe shortness of breath when getting up to bathroom - sats maintained during this.  Patient continues to require 3 to 4 L of oxygen by nasal cannula.  He did not use the BiPAP overnight as he reports has been a struggle every night and just couldn't do it.  He did trial of life 2000 in the hospital yesterday and thought he liked it (nasal pillows) but wasn't sure he noticed much.    Impression:  I personally reviewed most recent pertinent labs, imaging and micro data, from 07/07/2022, which are signficant for worse respiratory acidosis:  venous pH 7.31, PCO2 71, lower respiratory culture showing oropharyngeal flora.      Recommendations     #Acute COPD Exacerbation vs Bronchiectasis Exacerbation 2/2 superimposed LLL pneumonia   #GOLD Stage E COPD  #Bronchiectasis c/b history of MDR PsA and MDR Achromobacter   Patient has had multiple hospitalizations for COPD vs Bronchiectasis exacerbations, with his most recent from 7/5-7/14.  CXR consistent with LLL pneumonia, which is likely exacerbating factor. Patient was restarted on Levofloxacin on admission given prior susceptibility and had Cefepime added for double GNR coverage to include PsA. RPP negative. LRCx showing OP flora.  Prior history of MDR Pseudomonas and Achromobacter.    -Continue Levaquin for total of 14 days and Cefepime for total of 7 days.  Patient should stay in the hospital till he completes 7-day course of cefepime and then can be discharged with remaining 7 days of Levaquin to be completed at home.  -Continue Pulmicort 0.5 mg BID, to be also prescribed at discharge  -Continue DuoNeb Q6H, to be also prescribed at discharge for home regimen, albuterol Q4H PRN  -Continue prednisone 40 mg for total of 5 days, then can taper down to 30 mg for 3 days, 20 for 3 days, 10 for 3  days and 5 for 3 days then discontinue then.  -Continue airway clearance with DuoNeb QID, hypertonic saline 10% QID, and Aerobika QID all of which should be prescribed at discharge and make sure he has them at home.  -Wean oxygen via [place] to goal SpO2 88-92%  -Hold inhaled Tobramycin while in acute bronchiectasis exacerbation and in setting of double GNR coverage. Plan to restart upon discharge for 28 day course.  -Is not tolerating nocturnal BiPAP but perhaps would with nasal pillows?  -Team needs to contact his wound/[place], number provided, 1-2 days prior to discharge up at home by the time he reaches.    Discussed with team today - he may still be interested in hospice, will follow up and aid as we can.     Please do not hesitate to page [phone] [facility] consult) with questions. We appreciate the opportunity to assist in the care of this patient. We look forward to following with you.    Pablo Ledger, MD    Subjective & Objective     Vitals - past 24 hours  Temp:  [36.6 ??C (97.9 ??F)-37.2 ??C (99 ??F)] 37 ??C (98.6 ??F)  Heart Rate:  [79-122] 92  SpO2 Pulse:  [81-107] 91  Resp:  [8-23] 8  BP: (91-109)/(46-66) 96/46  FiO2 (%):  [40 %] 40 %  SpO2:  [95 %-100 %] 100 % Intake/Output  I/O last 3 completed shifts:  In: 2630 [P.O.:1880; IV Piggyback:750]  Out: 4600 [Urine:4600]      Pertinent exam findings:     Gen: Patient laying in bed, awake, alert, oriented to time place and person, mild pallor, no icterus,  cyanosis, mild clubbing.  Head neck ENT: No JVD, no lymphadenopathy-cervical or supraclavicular, no thyromegaly, moist mucosa, normal oropharynx  CVS: S1-S2 heard normally, no murmurs appreciated, no rubs or gallops  Respiratory: Distant breath sounds bilaterally with bilateral coarse sounds   Abdomen: Soft, nontender, nondistended, no organomegaly, bowel sounds present  Neuro: Nonfocal neurological exam, no sensory deficits, cranial nerves grossly intact  Skin extremities: No rash, no pedal edema    Relevant Imaging:  - CXR on 07/03/22 revealed increased opacity in the mid to lower left lung superimposed on chronic parenchymal changes, raising suspicion for acute pneumonia.   - CT chest without contrast on 05/16/22 revealed stable multifocal consolidation related to prior episodes of infection or aspiration with no acute consolidation. Fixed stricture bronchus intermedius without change. There are secretions at this level. This may be related to prior infection or aspiration. Significant coronary artery and aortic valve calcification. Correlate cardiac echo as indicated to assess for possible aortic stenosis. Extensive centrilobular and paraseptal emphysema.    Arterial Blood Gas:   No results in the last day     Venous Blood Gas:   Recent Labs   Lab Units 07/07/22  0552   PH VEN  7.31*   PCO2 VEN mm Hg 71*   PO2 VEN mm Hg 26*   HCO3 VEN mmol/L 31*   BASE EXC VEN  9.4*   O2 SAT VEN % 41.3        Cultures:  Lower Respiratory Culture (no units)   Date Value   07/04/2022 OROPHARYNGEAL FLORA ISOLATED   06/15/2022 2+ Achromobacter species (A)   06/15/2022 3+ Oropharyngeal Flora Isolated     WBC (10*9/L)   Date Value   07/07/2022 15.2 (H)          Other Labs:  Lab Results   Component Value Date  WBC 15.2 (H) 07/07/2022    HGB 9.1 (L) 07/07/2022    HCT 29.1 (L) 07/07/2022    PLT 343 07/07/2022     Lab Results   Component Value Date    NA 139 07/07/2022    K 4.0 07/07/2022    CL 102 07/07/2022    CO2 35.9 (H) 07/07/2022    BUN 16 07/07/2022    CREATININE 0.66 07/07/2022    GLU 87 07/07/2022    CALCIUM 9.0 07/07/2022    MG 2.1 07/07/2022    PHOS 2.5 05/07/2022     Lab Results   Component Value Date    BILITOT 0.2 (L) 07/03/2022    BILIDIR <0.10 11/03/2021    PROT 7.5 07/03/2022    ALBUMIN 2.6 (L) 07/03/2022    ALT <7 (L) 07/03/2022    AST 12 07/03/2022    ALKPHOS 104 07/03/2022     Lab Results   Component Value Date    INR 1.20 05/16/2021    APTT 31.3 05/05/2021       Allergies & Home Medications   Personally reviewed in Epic    Continuous Infusions:       Scheduled Medications:   ??? budesonide  0.5 mg Nebulization BID (RT)   ??? buprenorphine-naloxone  1 tablet Sublingual Q8H SCH   ??? Cefepime  2 g Intravenous Q8H SCH   ??? enoxaparin (LOVENOX) injection  40 mg Subcutaneous Nightly   ??? ferrous sulfate  325 mg Oral Every other day   ??? gabapentin  900 mg Oral TID   ??? ipratropium-albuteroL  3 mL Nebulization Q6H (RT)   ??? levoFLOXacin  750 mg Oral Q24H SCH   ??? lidocaine  1 patch Transdermal Daily   ??? [START ON 07/08/2022] predniSONE  40 mg Oral Daily    Followed by   ??? [START ON 07/09/2022] predniSONE  30 mg Oral Daily    Followed by   ??? [START ON 07/12/2022] predniSONE  20 mg Oral Daily    Followed by   ??? [START ON 07/15/2022] predniSONE  10 mg Oral Daily    Followed by   ??? [START ON 07/18/2022] predniSONE  5 mg Oral Daily   ??? sodium chloride  5 mL Nebulization QID       PRN medications:  albuterol

## 2022-07-07 NOTE — Unmapped (Signed)
Pt remains stepdown status. Afebrile. A&Ox4. PT with rhonchi and expiratory wheezing. 4-5L [place], SPO2 8-92%. Refused BiPAP. NSR/ST, MAP >65. Bowel sounds active, 1BM this shift. UOP adequate, >55mL/hr. Pt refused wound assessment and changing mepilex. MD notified. Pt also refusing to be turned. Ambulated to bathroom, dyspnea with exertion and severe anxiety. SpO2 maintained, RR increased. MD notified. IV abx completed. L FA IV infiltrated. New IV obtained via rounding nurse. Fall precautions maintained, Call bell within reach.     Problem: Self-Care Deficit  Goal: Improved Ability to Complete Activities of Daily Living  Outcome: Ongoing - Unchanged     Problem: Fall Injury Risk  Goal: Absence of Fall and Fall-Related Injury  Outcome: Ongoing - Unchanged  Intervention: Promote Injury-Free Environment  Recent Flowsheet Documentation  Taken 07/06/2022 2000 by Kenna Gilbert, RN  Safety Interventions:   fall reduction program maintained   commode/urinal/bedpan at bedside   infection management   isolation precautions   lighting adjusted for tasks/safety   low bed   nonskid shoes/slippers when out of bed     Problem: Skin Injury Risk Increased  Goal: Skin Health and Integrity  Outcome: Ongoing - Unchanged  Intervention: Optimize Skin Protection  Recent Flowsheet Documentation  Taken 07/07/2022 0200 by Kenna Gilbert, RN  Head of Bed [facility]) Positioning: HOB at 30-45 degrees  Taken 07/07/2022 0000 by Kenna Gilbert, RN  Head of Bed [facility]) Positioning: HOB at 30-45 degrees  Taken 07/06/2022 2200 by Kenna Gilbert, RN  Head of Bed [facility]) Positioning: HOB at 30-45 degrees  Taken 07/06/2022 2000 by Kenna Gilbert, RN  Pressure Reduction Techniques:   frequent weight shift encouraged   heels elevated off bed   positioned off wounds  Head of Bed (HOB) Positioning: HOB at 30-45 degrees  Pressure Reduction Devices: pressure-redistributing mattress utilized     Problem: Adult Inpatient Plan of Care  Goal: Plan of Care Review  Outcome: Ongoing - Unchanged  Goal: Patient-Specific Goal (Individualized)  Outcome: Ongoing - Unchanged  Goal: Absence of Hospital-Acquired Illness or Injury  Outcome: Ongoing - Unchanged  Intervention: Identify and Manage Fall Risk  Recent Flowsheet Documentation  Taken 07/06/2022 2000 by Kenna Gilbert, RN  Safety Interventions:   fall reduction program maintained   commode/urinal/bedpan at bedside   infection management   isolation precautions   lighting adjusted for tasks/safety   low bed   nonskid shoes/slippers when out of bed  Intervention: Prevent and Manage VTE (Venous Thromboembolism) Risk  Recent Flowsheet Documentation  Taken 07/07/2022 0200 by Kenna Gilbert, RN  Activity Management: activity adjusted per tolerance  Taken 07/07/2022 0000 by Kenna Gilbert, RN  Activity Management: activity adjusted per tolerance  Taken 07/06/2022 2200 by Kenna Gilbert, RN  Activity Management: activity adjusted per tolerance  Taken 07/06/2022 2000 by Kenna Gilbert, RN  Activity Management: activity adjusted per tolerance  Range of Motion: Bilateral Upper and Lower Extremities  Intervention: Prevent Infection  Recent Flowsheet Documentation  Taken 07/06/2022 2000 by Kenna Gilbert, RN  Infection Prevention:   cohorting utilized   environmental surveillance performed   equipment surfaces disinfected   hand hygiene promoted   personal protective equipment utilized   rest/sleep promoted  Goal: Optimal Comfort and Wellbeing  Outcome: Ongoing - Unchanged  Goal: Readiness for Transition of Care  Outcome: Ongoing - Unchanged  Goal: Rounds/Family Conference  Outcome: Ongoing - Unchanged     Problem: Infection  Goal: Absence of Infection Signs and Symptoms  Outcome: Ongoing - Unchanged  Intervention: Prevent or Manage Infection  Recent Flowsheet Documentation  Taken 07/06/2022 2000 by Kenna Gilbert, RN  Infection Management: aseptic technique maintained  Isolation Precautions: contact precautions maintained     Problem: Impaired Wound Healing  Goal: Optimal Wound Healing  Outcome: Ongoing - Unchanged  Intervention: Promote Wound Healing  Recent Flowsheet Documentation  Taken 07/07/2022 0200 by Kenna Gilbert, RN  Activity Management: activity adjusted per tolerance  Taken 07/07/2022 0000 by Kenna Gilbert, RN  Activity Management: activity adjusted per tolerance  Taken 07/06/2022 2200 by Kenna Gilbert, RN  Activity Management: activity adjusted per tolerance  Taken 07/06/2022 2000 by Kenna Gilbert, RN  Activity Management: activity adjusted per tolerance     Problem: Infection (Pneumonia)  Goal: Resolution of Infection Signs and Symptoms  Outcome: Ongoing - Unchanged  Intervention: Prevent Infection Progression  Recent Flowsheet Documentation  Taken 07/06/2022 2000 by Kenna Gilbert, RN  Infection Management: aseptic technique maintained  Isolation Precautions: contact precautions maintained     Problem: Respiratory Compromise (Pneumonia)  Goal: Effective Oxygenation and Ventilation  Outcome: Ongoing - Unchanged  Intervention: Optimize Oxygenation and Ventilation  Recent Flowsheet Documentation  Taken 07/07/2022 0200 by Kenna Gilbert, RN  Head of Bed [facility]) Positioning: HOB at 30-45 degrees  Taken 07/07/2022 0000 by Kenna Gilbert, RN  Head of Bed [facility]) Positioning: HOB at 30-45 degrees  Taken 07/06/2022 2200 by Kenna Gilbert, RN  Head of Bed [facility]) Positioning: HOB at 30-45 degrees  Taken 07/06/2022 2000 by Kenna Gilbert, RN  Head of Bed [facility]) Positioning: HOB at 30-45 degrees     Problem: Adjustment to Illness COPD (Chronic Obstructive Pulmonary Disease)  Goal: Optimal Chronic Illness Coping  Outcome: Ongoing - Unchanged     Problem: Functional Ability Impaired COPD (Chronic Obstructive Pulmonary Disease)  Goal: Optimal Level of Functional Independence  Outcome: Ongoing - Unchanged  Intervention: Optimize Functional Ability  Recent Flowsheet Documentation  Taken 07/07/2022 0200 by Kenna Gilbert, RN  Activity Management: activity adjusted per tolerance  Taken 07/07/2022 0000 by Kenna Gilbert, RN  Activity Management: activity adjusted per tolerance  Taken 07/06/2022 2200 by Kenna Gilbert, RN  Activity Management: activity adjusted per tolerance  Taken 07/06/2022 2000 by Kenna Gilbert, RN  Activity Management: activity adjusted per tolerance     Problem: Infection COPD (Chronic Obstructive Pulmonary Disease)  Goal: Absence of Infection Signs and Symptoms  Outcome: Ongoing - Unchanged  Intervention: Prevent or Manage Infection  Recent Flowsheet Documentation  Taken 07/06/2022 2000 by Kenna Gilbert, RN  Infection Management: aseptic technique maintained  Isolation Precautions: contact precautions maintained     Problem: Oral Intake Inadequate COPD (Chronic Obstructive Pulmonary Disease)  Goal: Improved Nutrition Intake  Outcome: Ongoing - Unchanged     Problem: Respiratory Compromise COPD (Chronic Obstructive Pulmonary Disease)  Goal: Effective Oxygenation and Ventilation  Outcome: Ongoing - Unchanged  Intervention: Promote Airway Secretion Clearance  Recent Flowsheet Documentation  Taken 07/07/2022 0200 by Kenna Gilbert, RN  Activity Management: activity adjusted per tolerance  Taken 07/07/2022 0000 by Kenna Gilbert, RN  Activity Management: activity adjusted per tolerance  Taken 07/06/2022 2200 by Kenna Gilbert, RN  Activity Management: activity adjusted per tolerance  Taken 07/06/2022 2000 by Kenna Gilbert, RN  Activity Management: activity adjusted per tolerance  Intervention: Optimize Oxygenation and Ventilation  Recent Flowsheet Documentation  Taken 07/07/2022 0200 by Kenna Gilbert, RN  Head of  Bed [facility]) Positioning: HOB at 30-45 degrees  Taken 07/07/2022 0000 by Kenna Gilbert, RN  Head of Bed [facility]) Positioning: HOB at 30-45 degrees  Taken 07/06/2022 2200 by Kenna Gilbert, RN  Head of Bed [facility]) Positioning: HOB at 30-45 degrees  Taken 07/06/2022 2000 by Kenna Gilbert, RN  Head of Bed [facility]) Positioning: HOB at 30-45 degrees     Problem: Asthma Comorbidity  Goal: Maintenance of Asthma Control  Outcome: Ongoing - Unchanged     Problem: Behavioral Health Comorbidity  Goal: Maintenance of Behavioral Health Symptom Control  Outcome: Ongoing - Unchanged     Problem: COPD (Chronic Obstructive Pulmonary Disease) Comorbidity  Goal: Maintenance of COPD Symptom Control  Outcome: Ongoing - Unchanged     Problem: Diabetes Comorbidity  Goal: Blood Glucose Level Within Targeted Range  Outcome: Ongoing - Unchanged     Problem: Heart Failure Comorbidity  Goal: Maintenance of Heart Failure Symptom Control  Outcome: Ongoing - Unchanged     Problem: Hypertension Comorbidity  Goal: Blood Pressure in Desired Range  Outcome: Ongoing - Unchanged     Problem: Obstructive Sleep Apnea Risk or Actual Comorbidity Management  Goal: Unobstructed Breathing During Sleep  Outcome: Ongoing - Unchanged     Problem: Osteoarthritis Comorbidity  Goal: Maintenance of Osteoarthritis Symptom Control  Outcome: Ongoing - Unchanged  Intervention: Maintain Osteoarthritis Symptom Control  Recent Flowsheet Documentation  Taken 07/07/2022 0200 by Kenna Gilbert, RN  Activity Management: activity adjusted per tolerance  Taken 07/07/2022 0000 by Kenna Gilbert, RN  Activity Management: activity adjusted per tolerance  Taken 07/06/2022 2200 by Kenna Gilbert, RN  Activity Management: activity adjusted per tolerance  Taken 07/06/2022 2000 by Kenna Gilbert, RN  Activity Management: activity adjusted per tolerance     Problem: Pain Chronic (Persistent) (Comorbidity Management)  Goal: Acceptable Pain Control and Functional Ability  Outcome: Ongoing - Unchanged     Problem: Seizure Disorder Comorbidity  Goal: Maintenance of Seizure Control  Outcome: Ongoing - Unchanged

## 2022-07-07 NOTE — Unmapped (Signed)
Problem: Self-Care Deficit  Goal: Improved Ability to Complete Activities of Daily Living  07/06/2022 1729 by Nira Retort, RN  Outcome: Progressing  07/06/2022 1728 by Nira Retort, RN  Outcome: Progressing     Problem: Skin Injury Risk Increased  Goal: Skin Health and Integrity  07/06/2022 1729 by Nira Retort, RN  Outcome: Progressing  07/06/2022 1728 by Nira Retort, RN  Outcome: Progressing     Problem: Adult Inpatient Plan of Care  Goal: Plan of Care Review  07/06/2022 1729 by Nira Retort, RN  Outcome: Progressing  07/06/2022 1728 by Nira Retort, RN  Outcome: Progressing  Goal: Patient-Specific Goal (Individualized)  07/06/2022 1729 by Nira Retort, RN  Outcome: Progressing  07/06/2022 1728 by Nira Retort, RN  Outcome: Progressing  Goal: Absence of Hospital-Acquired Illness or Injury  07/06/2022 1729 by Nira Retort, RN  Outcome: Progressing  07/06/2022 1728 by Nira Retort, RN  Outcome: Progressing  Intervention: Prevent and Manage VTE (Venous Thromboembolism) Risk  Recent Flowsheet Documentation  Taken 07/06/2022 1600 by Nira Retort, RN  Activity Management: activity adjusted per tolerance  Range of Motion: Bilateral Upper and Lower Extremities  Goal: Optimal Comfort and Wellbeing  07/06/2022 1729 by Nira Retort, RN  Outcome: Progressing  07/06/2022 1728 by Nira Retort, RN  Outcome: Progressing  Goal: Readiness for Transition of Care  07/06/2022 1729 by Nira Retort, RN  Outcome: Progressing  07/06/2022 1728 by Nira Retort, RN  Outcome: Progressing  Goal: Rounds/Family Conference  07/06/2022 1729 by Nira Retort, RN  Outcome: Progressing  07/06/2022 1728 by Nira Retort, RN  Outcome: Progressing     Problem: Self-Care Deficit  Goal: Improved Ability to Complete Activities of Daily Living  07/06/2022 1729 by Nira Retort, RN  Outcome: Progressing  07/06/2022 1728 by Nira Retort, RN  Outcome: Progressing     Problem: Adult Inpatient Plan of Care  Goal: Patient-Specific Goal (Individualized)  07/06/2022 1729 by Nira Retort, RN  Outcome: Progressing  07/06/2022 1728 by Nira Retort, RN  Outcome: Progressing

## 2022-07-08 LAB — ADDON DIFFERENTIAL ONLY
BASOPHILS ABSOLUTE COUNT: 0.1 10*9/L (ref 0.0–0.1)
BASOPHILS RELATIVE PERCENT: 0.6 %
EOSINOPHILS ABSOLUTE COUNT: 0.3 10*9/L (ref 0.0–0.5)
EOSINOPHILS RELATIVE PERCENT: 2.2 %
LYMPHOCYTES ABSOLUTE COUNT: 1.9 10*9/L (ref 1.1–3.6)
LYMPHOCYTES RELATIVE PERCENT: 12.5 %
MONOCYTES ABSOLUTE COUNT: 0.9 10*9/L — ABNORMAL HIGH (ref 0.3–0.8)
MONOCYTES RELATIVE PERCENT: 5.7 %
NEUTROPHILS ABSOLUTE COUNT: 12.2 10*9/L — ABNORMAL HIGH (ref 1.8–7.8)
NEUTROPHILS RELATIVE PERCENT: 79 %

## 2022-07-08 LAB — BLOOD GAS, VENOUS
BASE EXCESS VENOUS: 10.3 — ABNORMAL HIGH (ref -2.0–2.0)
HCO3 VENOUS: 31 mmol/L — ABNORMAL HIGH (ref 22–27)
O2 SATURATION VENOUS: 33.6 % — ABNORMAL LOW (ref 40.0–85.0)
PCO2 VENOUS: 73 mmHg (ref 40–60)
PH VENOUS: 7.31 — ABNORMAL LOW (ref 7.32–7.43)
PO2 VENOUS: 25 mmHg — ABNORMAL LOW (ref 35–40)

## 2022-07-08 LAB — BASIC METABOLIC PANEL
ANION GAP: 2 mmol/L — ABNORMAL LOW (ref 5–14)
BLOOD UREA NITROGEN: 15 mg/dL (ref 9–23)
BUN / CREAT RATIO: 19
CALCIUM: 9 mg/dL (ref 8.7–10.4)
CHLORIDE: 102 mmol/L (ref 98–107)
CO2: 34.4 mmol/L — ABNORMAL HIGH (ref 20.0–31.0)
CREATININE: 0.77 mg/dL
EGFR CKD-EPI (2021) MALE: >90 mL/min/{1.73_m2} (ref >=60)
GLUCOSE RANDOM: 89 mg/dL (ref 70–179)
POTASSIUM: 3.9 mmol/L (ref 3.4–4.8)
SODIUM: 138 mmol/L (ref 135–145)

## 2022-07-08 LAB — CBC
HEMATOCRIT: 29.1 % — ABNORMAL LOW (ref 39.0–48.0)
HEMOGLOBIN: 9.3 g/dL — ABNORMAL LOW (ref 12.9–16.5)
MEAN CORPUSCULAR HEMOGLOBIN CONC: 31.9 g/dL — ABNORMAL LOW (ref 32.0–36.0)
MEAN CORPUSCULAR HEMOGLOBIN: 27.3 pg (ref 25.9–32.4)
MEAN CORPUSCULAR VOLUME: 85.7 fL (ref 77.6–95.7)
MEAN PLATELET VOLUME: 6.9 fL (ref 6.8–10.7)
PLATELET COUNT: 330 10*9/L (ref 150–450)
RED BLOOD CELL COUNT: 3.4 10*12/L — ABNORMAL LOW (ref 4.26–5.60)
RED CELL DISTRIBUTION WIDTH: 15.9 % — ABNORMAL HIGH (ref 12.2–15.2)
WBC ADJUSTED: 15.5 10*9/L — ABNORMAL HIGH (ref 3.6–11.2)

## 2022-07-08 LAB — HIGH SENSITIVITY TROPONIN I - SINGLE: HIGH SENSITIVITY TROPONIN I: 3 ng/L (ref <=53)

## 2022-07-08 LAB — MAGNESIUM: MAGNESIUM: 2.2 mg/dL (ref 1.6–2.6)

## 2022-07-08 LAB — B-TYPE NATRIURETIC PEPTIDE: B-TYPE NATRIURETIC PEPTIDE: 44.81 pg/mL (ref <=100)

## 2022-07-08 MED ADMIN — cefepime (MAXIPIME) 2 g in sodium chloride 0.9 % (NS) 100 mL IVPB-MBP: 2 g | INTRAVENOUS | @ 02:00:00 | Stop: 2022-07-10

## 2022-07-08 MED ADMIN — ipratropium-albuteroL (DUO-NEB) 0.5-2.5 mg/3 mL nebulizer solution 3 mL: 3 mL | RESPIRATORY_TRACT | @ 01:00:00

## 2022-07-08 MED ADMIN — gabapentin (NEURONTIN) capsule 900 mg: 900 mg | ORAL | @ 02:00:00

## 2022-07-08 MED ADMIN — gabapentin (NEURONTIN) capsule 900 mg: 900 mg | ORAL | @ 18:00:00

## 2022-07-08 MED ADMIN — ipratropium-albuteroL (DUO-NEB) 0.5-2.5 mg/3 mL nebulizer solution 3 mL: 3 mL | RESPIRATORY_TRACT | @ 09:00:00

## 2022-07-08 MED ADMIN — sodium chloride 10 % NEBULIZER solution 5 mL: 5 mL | RESPIRATORY_TRACT | @ 01:00:00

## 2022-07-08 MED ADMIN — sodium chloride 10 % NEBULIZER solution 5 mL: 5 mL | RESPIRATORY_TRACT | @ 20:00:00

## 2022-07-08 MED ADMIN — predniSONE (DELTASONE) tablet 40 mg: 40 mg | ORAL | @ 12:00:00 | Stop: 2022-07-08

## 2022-07-08 MED ADMIN — gabapentin (NEURONTIN) capsule 900 mg: 900 mg | ORAL | @ 13:00:00

## 2022-07-08 MED ADMIN — cefepime (MAXIPIME) 2 g in sodium chloride 0.9 % (NS) 100 mL IVPB-MBP: 2 g | INTRAVENOUS | @ 10:00:00 | Stop: 2022-07-10

## 2022-07-08 MED ADMIN — lidocaine (LIDODERM) 5 % patch 2 patch: 2 | TRANSDERMAL | @ 13:00:00

## 2022-07-08 MED ADMIN — buprenorphine-naloxone (SUBOXONE) 8-2 mg SL tablet 8 mg of buprenorphine: 1 | SUBLINGUAL | @ 02:00:00

## 2022-07-08 MED ADMIN — ipratropium-albuteroL (DUO-NEB) 0.5-2.5 mg/3 mL nebulizer solution 3 mL: 3 mL | RESPIRATORY_TRACT | @ 20:00:00

## 2022-07-08 MED ADMIN — buprenorphine-naloxone (SUBOXONE) 8-2 mg SL tablet 8 mg of buprenorphine: 1 | SUBLINGUAL | @ 10:00:00

## 2022-07-08 MED ADMIN — buprenorphine-naloxone (SUBOXONE) 8-2 mg SL tablet 8 mg of buprenorphine: 1 | SUBLINGUAL | @ 18:00:00

## 2022-07-08 MED ADMIN — sodium chloride 10 % NEBULIZER solution 5 mL: 5 mL | RESPIRATORY_TRACT | @ 12:00:00

## 2022-07-08 MED ADMIN — ferrous sulfate tablet 325 mg: 325 mg | ORAL | @ 12:00:00

## 2022-07-08 MED ADMIN — cefepime (MAXIPIME) 2 g in sodium chloride 0.9 % (NS) 100 mL IVPB-MBP: 2 g | INTRAVENOUS | @ 18:00:00 | Stop: 2022-07-10

## 2022-07-08 MED ADMIN — ipratropium-albuteroL (DUO-NEB) 0.5-2.5 mg/3 mL nebulizer solution 3 mL: 3 mL | RESPIRATORY_TRACT | @ 12:00:00

## 2022-07-08 MED ADMIN — budesonide (PULMICORT) nebulizer solution 0.5 mg: .5 mg | RESPIRATORY_TRACT | @ 12:00:00

## 2022-07-08 MED ADMIN — enoxaparin (LOVENOX) syringe 40 mg: 40 mg | SUBCUTANEOUS | @ 02:00:00

## 2022-07-08 MED ADMIN — sodium chloride 10 % NEBULIZER solution 5 mL: 5 mL | RESPIRATORY_TRACT | @ 09:00:00

## 2022-07-08 MED ADMIN — levoFLOXacin (LEVAQUIN) tablet 750 mg: 750 mg | ORAL | @ 12:00:00 | Stop: 2022-07-17

## 2022-07-08 MED ADMIN — budesonide (PULMICORT) nebulizer solution 0.5 mg: .5 mg | RESPIRATORY_TRACT | @ 01:00:00

## 2022-07-08 NOTE — Unmapped (Signed)
See note

## 2022-07-08 NOTE — Unmapped (Signed)
Family Medicine Inpatient Service    Progress Note    Team: Family Medicine [facility] (pgr [phone])    Hospital Day: 5    ASSESSMENT / PLAN:   Kristopher Obrien is a 71 y.o. male with pmh COPD with bronchiectasis (last CTA 05/10/22) on 4L O2, tobacco use disorder, opioid use disorder, and MDR resistant pseudomonas PNA who presents with dyspnea.     # Acute COPD Exacerbation - Severe COPD and Bronchiectasis - Hx MDR Pseudomonal PNA  Patient with hx of hospitalizations for COPD exacerbations, most recently from 7/5-7/14 in which he grew MDR Achromobacter species. This exacerbation likely 2/2 pneumonia given patient's past history and current CXR findings with mid-lower left lung opacities. LRCx unremarkable. Pt tried Life2000 system with much improvement in breathing, though Life2000 may not be compatible with home hospice given that it is considered life-prolonging treatment. Discussed this with the pt today, GOC ongoing.  - BiPAP PRN for pt sx relief  -consult pulmonology, appreciate recs   -Continue Cefepime 2g q8h for total of 7 days (thru 8/3).   - Levaquin 750 mg PO daily for total of 14 days (thru 8/9), will be discharged with remaining days of Levaquin PO.  -Continue Pulmicort 0.5 mg BID, to be also prescribed at discharge  -Continue DuoNeb Q6H, to be also prescribed at discharge for home regimen, albuterol Q4H PRN  -Continue prednisone 40 mg for total of 5 days, then can taper down to 30 mg for 3 days, 20 for 3 days, 10 for 3 days and 5 for 3 days then discontinue.  -Continue airway clearance with DuoNeb QID, hypertonic saline 10% QID, and Aerobika QID all of which should be prescribed at discharge and make sure he has them at home.  -daily bmp, mg, vbg     # Opioid Use Disorder  - Continue home suboxone     #Iron Deficiency Anemia  Last colonoscopy in 08/09/2020, found one advanced adenomatous polyp. Anemia stable at baseline.  -Daily cbc   -Continue home iron supplementation     # Checklist:  - IVF None  - Tubes/Lines/Drains: PIV  - Diet Regular  - Bowel Regimen: Miralax PRN  - DVT: SQ Lovenox  - Code Status:   Orders Placed This Encounter   Procedures    DNR (Do Not Resuscitate) and DNI (Do Not Intubate)     Standing Status:   Standing     Number of Occurrences:   1     - Dispo: Floor    [ ]  Anticipated Discharge Location: Home likely with hospice - pt lives with daughter  [ ]  PT/OT/DME: No needs anticipated  [ ]  CM/SW needs: Home hospice (likely)  [ ]  Meds/Rx:  Not yet prescribed. No special med needs  [ ]  Teaching: None anticipated  [ ]  Follow up appt: Appointment needed  [ ]  Excuse letter: Needed  [ ]  Transport: Private Needed    SUBJECTIVE:  Interval events: Pt feeling not well again today. Says BiPAP helped but makes him feel claustrophobic. Discussed Life2000 - pt was really excited about trying it, though very disappointed to hear it might not be covered by insurance. Still having some chest pressure today, constant, doesn't get better or worse. Feels like it's in his chest wall.       REVIEW OF SYSTEMS:  Pertinent positives and negatives per HPI. A complete review of systems otherwise negative.    PHYSICAL EXAM:      Intake/Output Summary (Last 24 hours) at 07/08/2022  0865  Last data filed at 07/08/2022 0400  Gross per 24 hour   Intake 2730 ml   Output 3700 ml   Net -970 ml         Recent Vitals:  Vitals:    07/08/22 0430   BP:    Pulse: 78   Resp: 14   Temp:    SpO2: 100%       GEN: chronically ill appearing, sitting up in bed, NAD   HEENT: NCAT, No scleral icterus. Conjunctiva non-erythematous. MMM.  CV: Tachycardic rate and regular rhythm. No murmurs/rubs/gallops.  Pulm: Lungs with diffuse crackles, diffuse rhonchi, no wheezing.  Abd: Flat.  Nontender. No guarding, rebound.  Normoactive bowel sounds.    Neuro: A&O x 3. No focal deficits.  Ext: No peripheral edema.  Palpable distal pulses.  Skin: No rashes or skin lesions.   Psych: Normal mood/affect though somewhat sad/despondent       LABS/ STUDIES:    All imaging, laboratory studies, and other pertinent tests including electrocardiography within the last 24 hours were reviewed and are summarized within the assessment and plan.     NUTRITION:                           Kristopher Crigler, MD,  Family Med PGY2

## 2022-07-08 NOTE — Unmapped (Signed)
Asked pt when he wanted to go on bipap and he states he does not. Pt became very anxious even exhibiting a 50 beat per minute increase in HR. Bipap remains on standby at bedside.

## 2022-07-08 NOTE — Unmapped (Signed)
Pt on 5LNC and weaned to 3L. Received all inhaled medications with no adverse reactions. Pt used [place] for mucus clearance and had a strong productive cough noted during and after. Everything rinsed and placed out to dry. Care for this pt continues.

## 2022-07-08 NOTE — Unmapped (Signed)
Pt stepdown status. Aox4. NSR-ST, rates 70s-120s, bp normotensive. Remains on 4L [place], sats >92%.; [facility] pending. Adequate UOP,  good PO fluid intake. No bm this shift. Pt declining repositioning, education provided at bedside, endorses turning self. Bed locked and in lowest position, call bell within reach. Plan to continue respiratory interventions, symptom management, IV abx.   Problem: Self-Care Deficit  Goal: Improved Ability to Complete Activities of Daily Living  Outcome: Progressing  Intervention: Promote Activity and Functional Independence  Recent Flowsheet Documentation  Taken 07/07/2022 1956 by Johnsie Cancel, RN  Self-Care Promotion:   independence encouraged   BADL personal objects within reach     Problem: Fall Injury Risk  Goal: Absence of Fall and Fall-Related Injury  Outcome: Progressing  Intervention: Identify and Manage Contributors  Recent Flowsheet Documentation  Taken 07/07/2022 1956 by Johnsie Cancel, RN  Self-Care Promotion:   independence encouraged   BADL personal objects within reach  Intervention: Promote Injury-Free Environment  Recent Flowsheet Documentation  Taken 07/07/2022 2000 by Johnsie Cancel, RN  Safety Interventions:   fall reduction program maintained   isolation precautions   lighting adjusted for tasks/safety   low bed   nonskid shoes/slippers when out of bed     Problem: Skin Injury Risk Increased  Goal: Skin Health and Integrity  Outcome: Progressing  Intervention: Optimize Skin Protection  Recent Flowsheet Documentation  Taken 07/08/2022 0000 by Johnsie Cancel, RN  Pressure Reduction Techniques: frequent weight shift encouraged  Taken 07/07/2022 2200 by Johnsie Cancel, RN  Pressure Reduction Techniques: frequent weight shift encouraged  Taken 07/07/2022 2000 by Johnsie Cancel, RN  Head of Bed [facility]) Positioning: HOB at 30-45 degrees  Taken 07/07/2022 1956 by Johnsie Cancel, RN  Pressure Reduction Techniques: frequent weight shift encouraged  Pressure Reduction Devices:   pressure-redistributing mattress utilized   positioning supports utilized  Skin Protection: adhesive use limited     Problem: Adult Inpatient Plan of Care  Goal: Plan of Care Review  Outcome: Progressing  Goal: Patient-Specific Goal (Individualized)  Outcome: Progressing  Goal: Absence of Hospital-Acquired Illness or Injury  Outcome: Progressing  Intervention: Identify and Manage Fall Risk  Recent Flowsheet Documentation  Taken 07/07/2022 2000 by Johnsie Cancel, RN  Safety Interventions:   fall reduction program maintained   isolation precautions   lighting adjusted for tasks/safety   low bed   nonskid shoes/slippers when out of bed  Intervention: Prevent Skin Injury  Recent Flowsheet Documentation  Taken 07/07/2022 1956 by Johnsie Cancel, RN  Skin Protection: adhesive use limited  Intervention: Prevent and Manage VTE (Venous Thromboembolism) Risk  Recent Flowsheet Documentation  Taken 07/07/2022 2000 by Johnsie Cancel, RN  Activity Management: activity adjusted per tolerance  Taken 07/07/2022 1956 by Johnsie Cancel, RN  VTE Prevention/Management: anticoagulant therapy  Intervention: Prevent Infection  Recent Flowsheet Documentation  Taken 07/07/2022 2000 by Johnsie Cancel, RN  Infection Prevention:   hand hygiene promoted   personal protective equipment utilized   rest/sleep promoted   single patient room provided  Goal: Optimal Comfort and Wellbeing  Outcome: Progressing  Goal: Readiness for Transition of Care  Outcome: Progressing  Goal: Rounds/Family Conference  Outcome: Progressing     Problem: Infection  Goal: Absence of Infection Signs and Symptoms  Outcome: Progressing  Intervention: Prevent or Manage Infection  Recent Flowsheet Documentation  Taken 07/07/2022 2000 by Johnsie Cancel, RN  Infection Management: aseptic technique maintained  Isolation Precautions: contact precautions maintained  Problem: Impaired Wound Healing  Goal: Optimal Wound Healing  Outcome: Progressing  Intervention: Promote Wound Healing  Recent Flowsheet Documentation  Taken 07/07/2022 2000 by Johnsie Cancel, RN  Activity Management: activity adjusted per tolerance  Sleep/Rest Enhancement:   awakenings minimized   regular sleep/rest pattern promoted   room darkened     Problem: Infection (Pneumonia)  Goal: Resolution of Infection Signs and Symptoms  Outcome: Progressing  Intervention: Prevent Infection Progression  Recent Flowsheet Documentation  Taken 07/07/2022 2000 by Johnsie Cancel, RN  Infection Management: aseptic technique maintained  Isolation Precautions: contact precautions maintained     Problem: Respiratory Compromise (Pneumonia)  Goal: Effective Oxygenation and Ventilation  Outcome: Progressing  Intervention: Optimize Oxygenation and Ventilation  Recent Flowsheet Documentation  Taken 07/07/2022 2000 by Johnsie Cancel, RN  Head of Bed [facility]) Positioning: HOB at 30-45 degrees     Problem: Adjustment to Illness COPD (Chronic Obstructive Pulmonary Disease)  Goal: Optimal Chronic Illness Coping  Outcome: Progressing     Problem: Functional Ability Impaired COPD (Chronic Obstructive Pulmonary Disease)  Goal: Optimal Level of Functional Independence  Outcome: Progressing  Intervention: Optimize Functional Ability  Recent Flowsheet Documentation  Taken 07/07/2022 2000 by Johnsie Cancel, RN  Activity Management: activity adjusted per tolerance  Taken 07/07/2022 1956 by Johnsie Cancel, RN  Self-Care Promotion:   independence encouraged   BADL personal objects within reach     Problem: Infection COPD (Chronic Obstructive Pulmonary Disease)  Goal: Absence of Infection Signs and Symptoms  Outcome: Progressing  Intervention: Prevent or Manage Infection  Recent Flowsheet Documentation  Taken 07/07/2022 2000 by Johnsie Cancel, RN  Infection Management: aseptic technique maintained  Isolation Precautions: contact precautions maintained     Problem: Oral Intake Inadequate COPD (Chronic Obstructive Pulmonary Disease)  Goal: Improved Nutrition Intake  Outcome: Progressing     Problem: Respiratory Compromise COPD (Chronic Obstructive Pulmonary Disease)  Goal: Effective Oxygenation and Ventilation  Outcome: Progressing  Intervention: Promote Airway Secretion Clearance  Recent Flowsheet Documentation  Taken 07/07/2022 2000 by Johnsie Cancel, RN  Activity Management: activity adjusted per tolerance  Intervention: Optimize Oxygenation and Ventilation  Recent Flowsheet Documentation  Taken 07/07/2022 2000 by Johnsie Cancel, RN  Head of Bed [facility]) Positioning: HOB at 30-45 degrees     Problem: Asthma Comorbidity  Goal: Maintenance of Asthma Control  Outcome: Progressing     Problem: Behavioral Health Comorbidity  Goal: Maintenance of Behavioral Health Symptom Control  Outcome: Progressing     Problem: COPD (Chronic Obstructive Pulmonary Disease) Comorbidity  Goal: Maintenance of COPD Symptom Control  Outcome: Progressing     Problem: Diabetes Comorbidity  Goal: Blood Glucose Level Within Targeted Range  Outcome: Progressing     Problem: Heart Failure Comorbidity  Goal: Maintenance of Heart Failure Symptom Control  Outcome: Progressing     Problem: Hypertension Comorbidity  Goal: Blood Pressure in Desired Range  Outcome: Progressing     Problem: Obstructive Sleep Apnea Risk or Actual Comorbidity Management  Goal: Unobstructed Breathing During Sleep  Outcome: Progressing     Problem: Osteoarthritis Comorbidity  Goal: Maintenance of Osteoarthritis Symptom Control  Outcome: Progressing  Intervention: Maintain Osteoarthritis Symptom Control  Recent Flowsheet Documentation  Taken 07/07/2022 2000 by Johnsie Cancel, RN  Activity Management: activity adjusted per tolerance     Problem: Pain Chronic (Persistent) (Comorbidity Management)  Goal: Acceptable Pain Control and Functional Ability  Outcome: Progressing  Intervention: Manage Persistent Pain  Recent Flowsheet Documentation  Taken 07/07/2022 2000  by Johnsie Cancel, RN  Sleep/Rest Enhancement:   awakenings minimized   regular sleep/rest pattern promoted   room darkened     Problem: Seizure Disorder Comorbidity  Goal: Maintenance of Seizure Control  Outcome: Progressing

## 2022-07-08 NOTE — Unmapped (Signed)
Problem: Self-Care Deficit  Goal: Improved Ability to Complete Activities of Daily Living  Outcome: Ongoing - Unchanged     Problem: Fall Injury Risk  Goal: Absence of Fall and Fall-Related Injury  Outcome: Ongoing - Unchanged     Problem: Skin Injury Risk Increased  Goal: Skin Health and Integrity  Outcome: Ongoing - Unchanged     Problem: Adult Inpatient Plan of Care  Goal: Plan of Care Review  Outcome: Ongoing - Unchanged  Goal: Patient-Specific Goal (Individualized)  Outcome: Ongoing - Unchanged  Goal: Absence of Hospital-Acquired Illness or Injury  Outcome: Ongoing - Unchanged  Intervention: Prevent and Manage VTE (Venous Thromboembolism) Risk  Recent Flowsheet Documentation  Taken 07/07/2022 0800 by Westley Foots, RN  Activity Management: activity adjusted per tolerance  Goal: Optimal Comfort and Wellbeing  Outcome: Ongoing - Unchanged  Goal: Readiness for Transition of Care  Outcome: Ongoing - Unchanged  Goal: Rounds/Family Conference  Outcome: Ongoing - Unchanged     Problem: Infection  Goal: Absence of Infection Signs and Symptoms  Outcome: Ongoing - Unchanged     Problem: Impaired Wound Healing  Goal: Optimal Wound Healing  Outcome: Ongoing - Unchanged  Intervention: Promote Wound Healing  Recent Flowsheet Documentation  Taken 07/07/2022 0800 by Westley Foots, RN  Activity Management: activity adjusted per tolerance     Problem: Infection (Pneumonia)  Goal: Resolution of Infection Signs and Symptoms  Outcome: Ongoing - Unchanged     Problem: Respiratory Compromise (Pneumonia)  Goal: Effective Oxygenation and Ventilation  Outcome: Ongoing - Unchanged     Problem: Adjustment to Illness COPD (Chronic Obstructive Pulmonary Disease)  Goal: Optimal Chronic Illness Coping  Outcome: Ongoing - Unchanged     Problem: Functional Ability Impaired COPD (Chronic Obstructive Pulmonary Disease)  Goal: Optimal Level of Functional Independence  Outcome: Ongoing - Unchanged  Intervention: Optimize Functional Ability  Recent Flowsheet Documentation  Taken 07/07/2022 0800 by Westley Foots, RN  Activity Management: activity adjusted per tolerance     Problem: Infection COPD (Chronic Obstructive Pulmonary Disease)  Goal: Absence of Infection Signs and Symptoms  Outcome: Ongoing - Unchanged     Problem: Oral Intake Inadequate COPD (Chronic Obstructive Pulmonary Disease)  Goal: Improved Nutrition Intake  Outcome: Ongoing - Unchanged     Problem: Respiratory Compromise COPD (Chronic Obstructive Pulmonary Disease)  Goal: Effective Oxygenation and Ventilation  Outcome: Ongoing - Unchanged  Intervention: Promote Airway Secretion Clearance  Recent Flowsheet Documentation  Taken 07/07/2022 0800 by Westley Foots, RN  Activity Management: activity adjusted per tolerance     Problem: Asthma Comorbidity  Goal: Maintenance of Asthma Control  Outcome: Ongoing - Unchanged     Problem: Behavioral Health Comorbidity  Goal: Maintenance of Behavioral Health Symptom Control  Outcome: Ongoing - Unchanged     Problem: COPD (Chronic Obstructive Pulmonary Disease) Comorbidity  Goal: Maintenance of COPD Symptom Control  Outcome: Ongoing - Unchanged     Problem: Diabetes Comorbidity  Goal: Blood Glucose Level Within Targeted Range  Outcome: Ongoing - Unchanged     Problem: Heart Failure Comorbidity  Goal: Maintenance of Heart Failure Symptom Control  Outcome: Ongoing - Unchanged     Problem: Hypertension Comorbidity  Goal: Blood Pressure in Desired Range  Outcome: Ongoing - Unchanged     Problem: Obstructive Sleep Apnea Risk or Actual Comorbidity Management  Goal: Unobstructed Breathing During Sleep  Outcome: Ongoing - Unchanged     Problem: Osteoarthritis Comorbidity  Goal: Maintenance of Osteoarthritis Symptom Control  Outcome: Ongoing - Unchanged  Intervention: Maintain Osteoarthritis Symptom Control  Recent Flowsheet Documentation  Taken 07/07/2022 0800 by Westley Foots, RN  Activity Management: activity adjusted per tolerance     Problem: Pain Chronic (Persistent) (Comorbidity Management)  Goal: Acceptable Pain Control and Functional Ability  Outcome: Ongoing - Unchanged     Problem: Seizure Disorder Comorbidity  Goal: Maintenance of Seizure Control  Outcome: Ongoing - Unchanged

## 2022-07-08 NOTE — Unmapped (Signed)
Pt currently on 4lpm [place] tolerating well.  All scheduled inhaled medications administered. Airway clearance done via [place]. No apparent distress noted at this time. RT will continue to follow.

## 2022-07-08 NOTE — Unmapped (Signed)
General Pulmonary Team Follow Up Consult Note     Date of Service: 07/08/2022  Requesting Physician: Leitha Schuller, MD   Requesting Service: Family Medicine [facility])  Reason for consultation: Comprehensive evaluation of  COPD vs Bronchiectasis exacerbation .    Hospital Problems:  Principal Problem:    COPD exacerbation (CMS-HCC)  Active Problems:    Stage 4 very severe COPD by GOLD classification (CMS-HCC)    Pulmonary nodule    Bronchiectasis with acute exacerbation (CMS-HCC)    Chronic pain due to trauma    Recurrent pneumonia    Dyspnea and respiratory abnormalities    History of MDR Pseudomonas aeruginosa infection    History of smoking greater than 50 pack years    Buprenorphine dependence (CMS-HCC)      HPI: Kristopher Obrien is a 71 y.o. male with a past medical history significant pertinent for GOLD 4 Group E COPD (last PFTs 07/27/20) on 4 L [place] at home, bronchiectasis complicated by multiple prior exacerbations (last discharged 06/20/22 from [facility]) with prior cultures growing multi-drug-resistant PsA and MDR Achromobacter, prior pulmonary hypertension by echocardiography (2021), history of tobacco use disorder (quit 2-3 years ago, 50+ pk-yrs), opioid use disorder, who presented 7/27 with dyspnea.     Problems addressed during this consult include acute hypercapneic respiratory failure acute hypoxic respiratory failure acute on chronic respiratory failure bronchiectasis COPD exacerbation history of MDR Pseudomonas infection, tobacco use disorder  . Based on these problems, the patient has high risk of morbidity/mortality which is commensurate w their risk of management options described below in the recommendations.    Assessment      Interval Events: Still very breathless. He is considering hospice but worries it will not cover his pocket vent - I did explain this was for symptom control and if the overall goals of hospice is what he wants not to let that be a barrier.     Impression:  I personally reviewed most recent pertinent labs, imaging and micro data, from 07/08/2022, which are signficant for stable but significant respiratory acidosis:  venous pH 7.31, PCO2 73, lower respiratory culture showing oropharyngeal flora.      Recommendations     #Acute COPD Exacerbation vs Bronchiectasis Exacerbation 2/2 superimposed LLL pneumonia   #GOLD Stage E COPD  #Bronchiectasis c/b history of MDR PsA and MDR Achromobacter   Patient has had multiple hospitalizations for COPD vs Bronchiectasis exacerbations, with his most recent from 7/5-7/14.  CXR consistent with LLL pneumonia, which is likely exacerbating factor. Patient was restarted on Levofloxacin on admission given prior susceptibility and had Cefepime added for double GNR coverage to include PsA. RPP negative. LRCx showing OP flora.  Prior history of MDR Pseudomonas and Achromobacter.    -Continue Levaquin for total of 14 days and Cefepime for total of 7 days.  Patient should stay in the hospital till he completes 7-day course of cefepime and then can be discharged with remaining 7 days of Levaquin to be completed at home.  -Continue Pulmicort 0.5 mg BID, to be also prescribed at discharge  -Continue DuoNeb Q6H, to be also prescribed at discharge for home regimen, albuterol Q4H PRN  -Continue prednisone 40 mg for total of 5 days, then can taper down to 30 mg for 3 days, 20 for 3 days, 10 for 3 days and 5 for 3 days then discontinue then.  -Continue airway clearance with DuoNeb QID, hypertonic saline 10% QID, and Aerobika QID all of which should be prescribed at discharge and  make sure he has them at home.  -Wean oxygen via [place] to goal SpO2 88-92%  -Hold inhaled Tobramycin while in acute bronchiectasis exacerbation and in setting of double GNR coverage. Plan to restart upon discharge for 28 day course.  -Is not tolerating nocturnal BiPAP but perhaps would with nasal pillows?  -Team needs to contact his wound/[place], number provided, 1-2 days prior to discharge up at home by the time he reaches.    Discussed with team today - he may still be interested in hospice, will follow up and aid as we can.     Please do not hesitate to page [phone] [facility] consult) with questions. We appreciate the opportunity to assist in the care of this patient. We look forward to following with you.    Pablo Ledger, MD    Subjective & Objective     Vitals - past 24 hours  Temp:  [36.1 ??C (96.9 ??F)-37 ??C (98.6 ??F)] 37 ??C (98.6 ??F)  Heart Rate:  [78-145] 105  SpO2 Pulse:  [84-103] 103  Resp:  [10-20] 20  BP: (83-114)/(44-62) 102/44  FiO2 (%):  [40 %] 40 %  SpO2:  [93 %-100 %] 99 % Intake/Output  I/O last 3 completed shifts:  In: 2930 [P.O.:2630; IV Piggyback:300]  Out: 5250 [Urine:5250]      Pertinent exam findings:     Gen: Patient laying in bed, awake, alert, oriented to time place and person, mild pallor, no icterus,  cyanosis, mild clubbing.  Head neck ENT: No JVD, no lymphadenopathy-cervical or supraclavicular, no thyromegaly, moist mucosa, normal oropharynx  CVS: S1-S2 heard normally, no murmurs appreciated, no rubs or gallops  Respiratory: Distant breath sounds bilaterally with bilateral coarse sounds   Abdomen: Soft, nontender, nondistended, no organomegaly, bowel sounds present  Neuro: Nonfocal neurological exam, no sensory deficits, cranial nerves grossly intact  Skin extremities: No rash, no pedal edema    Relevant Imaging:  - CXR on 07/03/22 revealed increased opacity in the mid to lower left lung superimposed on chronic parenchymal changes, raising suspicion for acute pneumonia.   - CT chest without contrast on 05/16/22 revealed stable multifocal consolidation related to prior episodes of infection or aspiration with no acute consolidation. Fixed stricture bronchus intermedius without change. There are secretions at this level. This may be related to prior infection or aspiration. Significant coronary artery and aortic valve calcification. Correlate cardiac echo as indicated to assess for possible aortic stenosis. Extensive centrilobular and paraseptal emphysema.    Arterial Blood Gas:   No results in the last day     Venous Blood Gas:   Recent Labs   Lab Units 07/08/22  0552   PH VEN  7.31*   PCO2 VEN mm Hg 73*   PO2 VEN mm Hg 25*   HCO3 VEN mmol/L 31*   BASE EXC VEN  10.3*   O2 SAT VEN % 33.6*        Cultures:  Lower Respiratory Culture (no units)   Date Value   07/04/2022 OROPHARYNGEAL FLORA ISOLATED   06/15/2022 2+ Achromobacter species (A)   06/15/2022 3+ Oropharyngeal Flora Isolated     WBC (10*9/L)   Date Value   07/08/2022 15.5 (H)          Other Labs:  Lab Results   Component Value Date    WBC 15.5 (H) 07/08/2022    HGB 9.3 (L) 07/08/2022    HCT 29.1 (L) 07/08/2022    PLT 330 07/08/2022     Lab Results  Component Value Date    NA 138 07/08/2022    K 3.9 07/08/2022    CL 102 07/08/2022    CO2 34.4 (H) 07/08/2022    BUN 15 07/08/2022    CREATININE 0.77 07/08/2022    GLU 89 07/08/2022    CALCIUM 9.0 07/08/2022    MG 2.2 07/08/2022    PHOS 2.5 05/07/2022     Lab Results   Component Value Date    BILITOT 0.2 (L) 07/03/2022    BILIDIR <0.10 11/03/2021    PROT 7.5 07/03/2022    ALBUMIN 2.6 (L) 07/03/2022    ALT <7 (L) 07/03/2022    AST 12 07/03/2022    ALKPHOS 104 07/03/2022     Lab Results   Component Value Date    INR 1.20 05/16/2021    APTT 31.3 05/05/2021       Allergies & Home Medications   Personally reviewed in Epic    Continuous Infusions:       Scheduled Medications:    budesonide  0.5 mg Nebulization BID (RT)    buprenorphine-naloxone  1 tablet Sublingual Q8H SCH    Cefepime  2 g Intravenous Q8H SCH    enoxaparin (LOVENOX) injection  40 mg Subcutaneous Nightly    ferrous sulfate  325 mg Oral Every other day    gabapentin  900 mg Oral TID    ipratropium-albuteroL  3 mL Nebulization Q6H (RT)    levoFLOXacin  750 mg Oral Q24H SCH    lidocaine  2 patch Transdermal Daily    [START ON 07/09/2022] predniSONE  30 mg Oral Daily    Followed by    Melene Muller ON 07/12/2022] predniSONE  20 mg Oral Daily Followed by    Melene Muller ON 07/15/2022] predniSONE  10 mg Oral Daily    Followed by    Melene Muller ON 07/18/2022] predniSONE  5 mg Oral Daily    sodium chloride  5 mL Nebulization QID       PRN medications:  albuterol, polyethylene glycol

## 2022-07-09 LAB — BASIC METABOLIC PANEL
ANION GAP: 3 mmol/L — ABNORMAL LOW (ref 5–14)
BLOOD UREA NITROGEN: 15 mg/dL (ref 9–23)
BUN / CREAT RATIO: 25
CALCIUM: 8.8 mg/dL (ref 8.7–10.4)
CHLORIDE: 100 mmol/L (ref 98–107)
CO2: 36 mmol/L — ABNORMAL HIGH (ref 20.0–31.0)
CREATININE: 0.6 mg/dL
EGFR CKD-EPI (2021) MALE: >90 mL/min/{1.73_m2} (ref >=60)
GLUCOSE RANDOM: 92 mg/dL (ref 70–179)
POTASSIUM: 4.1 mmol/L (ref 3.4–4.8)
SODIUM: 139 mmol/L (ref 135–145)

## 2022-07-09 LAB — CBC
HEMATOCRIT: 29.9 % — ABNORMAL LOW (ref 39.0–48.0)
HEMOGLOBIN: 9.5 g/dL — ABNORMAL LOW (ref 12.9–16.5)
MEAN CORPUSCULAR HEMOGLOBIN CONC: 31.8 g/dL — ABNORMAL LOW (ref 32.0–36.0)
MEAN CORPUSCULAR HEMOGLOBIN: 26.7 pg (ref 25.9–32.4)
MEAN CORPUSCULAR VOLUME: 84.1 fL (ref 77.6–95.7)
MEAN PLATELET VOLUME: 7.1 fL (ref 6.8–10.7)
PLATELET COUNT: 342 10*9/L (ref 150–450)
RED BLOOD CELL COUNT: 3.55 10*12/L — ABNORMAL LOW (ref 4.26–5.60)
RED CELL DISTRIBUTION WIDTH: 15.5 % — ABNORMAL HIGH (ref 12.2–15.2)
WBC ADJUSTED: 16.7 10*9/L — ABNORMAL HIGH (ref 3.6–11.2)

## 2022-07-09 LAB — BLOOD GAS, VENOUS
BASE EXCESS VENOUS: 11.2 — ABNORMAL HIGH (ref -2.0–2.0)
HCO3 VENOUS: 32 mmol/L — ABNORMAL HIGH (ref 22–27)
O2 SATURATION VENOUS: 58 % (ref 40.0–85.0)
PCO2 VENOUS: 71 mmHg (ref 40–60)
PH VENOUS: 7.33 (ref 7.32–7.43)
PO2 VENOUS: 34 mmHg — ABNORMAL LOW (ref 35–40)

## 2022-07-09 LAB — MAGNESIUM: MAGNESIUM: 2.3 mg/dL (ref 1.6–2.6)

## 2022-07-09 MED ADMIN — cefepime (MAXIPIME) 2 g in sodium chloride 0.9 % (NS) 100 mL IVPB-MBP: 2 g | INTRAVENOUS | @ 02:00:00 | Stop: 2022-07-10

## 2022-07-09 MED ADMIN — sodium chloride 10 % NEBULIZER solution 5 mL: 5 mL | RESPIRATORY_TRACT | @ 08:00:00

## 2022-07-09 MED ADMIN — cefepime (MAXIPIME) 2 g in sodium chloride 0.9 % (NS) 100 mL IVPB-MBP: 2 g | INTRAVENOUS | @ 18:00:00 | Stop: 2022-07-10

## 2022-07-09 MED ADMIN — budesonide (PULMICORT) nebulizer solution 0.5 mg: .5 mg | RESPIRATORY_TRACT | @ 01:00:00

## 2022-07-09 MED ADMIN — buprenorphine-naloxone (SUBOXONE) 8-2 mg SL tablet 8 mg of buprenorphine: 1 | SUBLINGUAL | @ 02:00:00

## 2022-07-09 MED ADMIN — sodium chloride 10 % NEBULIZER solution 5 mL: 5 mL | RESPIRATORY_TRACT | @ 19:00:00

## 2022-07-09 MED ADMIN — buprenorphine-naloxone (SUBOXONE) 8-2 mg SL tablet 8 mg of buprenorphine: 1 | SUBLINGUAL | @ 10:00:00

## 2022-07-09 MED ADMIN — ipratropium-albuteroL (DUO-NEB) 0.5-2.5 mg/3 mL nebulizer solution 3 mL: 3 mL | RESPIRATORY_TRACT | @ 13:00:00

## 2022-07-09 MED ADMIN — cefepime (MAXIPIME) 2 g in sodium chloride 0.9 % (NS) 100 mL IVPB-MBP: 2 g | INTRAVENOUS | @ 10:00:00 | Stop: 2022-07-10

## 2022-07-09 MED ADMIN — gabapentin (NEURONTIN) capsule 900 mg: 900 mg | ORAL | @ 12:00:00

## 2022-07-09 MED ADMIN — buprenorphine-naloxone (SUBOXONE) 8-2 mg SL tablet 8 mg of buprenorphine: 1 | SUBLINGUAL | @ 18:00:00

## 2022-07-09 MED ADMIN — sodium chloride 10 % NEBULIZER solution 5 mL: 5 mL | RESPIRATORY_TRACT | @ 13:00:00

## 2022-07-09 MED ADMIN — sodium chloride 10 % NEBULIZER solution 5 mL: 5 mL | RESPIRATORY_TRACT | @ 01:00:00

## 2022-07-09 MED ADMIN — ipratropium-albuteroL (DUO-NEB) 0.5-2.5 mg/3 mL nebulizer solution 3 mL: 3 mL | RESPIRATORY_TRACT | @ 08:00:00

## 2022-07-09 MED ADMIN — gabapentin (NEURONTIN) capsule 900 mg: 900 mg | ORAL | @ 18:00:00

## 2022-07-09 MED ADMIN — ipratropium-albuteroL (DUO-NEB) 0.5-2.5 mg/3 mL nebulizer solution 3 mL: 3 mL | RESPIRATORY_TRACT | @ 19:00:00

## 2022-07-09 MED ADMIN — lidocaine (LIDODERM) 5 % patch 2 patch: 2 | TRANSDERMAL | @ 12:00:00

## 2022-07-09 MED ADMIN — enoxaparin (LOVENOX) syringe 40 mg: 40 mg | SUBCUTANEOUS | @ 02:00:00

## 2022-07-09 MED ADMIN — levoFLOXacin (LEVAQUIN) tablet 750 mg: 750 mg | ORAL | @ 12:00:00 | Stop: 2022-07-17

## 2022-07-09 MED ADMIN — gabapentin (NEURONTIN) capsule 900 mg: 900 mg | ORAL | @ 02:00:00

## 2022-07-09 MED ADMIN — budesonide (PULMICORT) nebulizer solution 0.5 mg: .5 mg | RESPIRATORY_TRACT | @ 13:00:00

## 2022-07-09 MED ADMIN — predniSONE (DELTASONE) tablet 30 mg: 30 mg | ORAL | @ 12:00:00 | Stop: 2022-07-12

## 2022-07-09 MED ADMIN — ipratropium-albuteroL (DUO-NEB) 0.5-2.5 mg/3 mL nebulizer solution 3 mL: 3 mL | RESPIRATORY_TRACT | @ 01:00:00

## 2022-07-09 NOTE — Unmapped (Addendum)
Pt received all scheduled breathing txs this shift.  Airway clearance done with [place].  Stable on 4L [place].  BBS very coarse throughout with exp wheezes.  Pt reports thick, yellow/green sputum.   Pt was not interested in trying bipap tonight, he stated he did not like the temperature and it made him claustrophobic.       Problem: Respiratory Compromise COPD (Chronic Obstructive Pulmonary Disease)  Goal: Effective Oxygenation and Ventilation  Outcome: Ongoing - Unchanged     Problem: Airway Clearance Ineffective  Goal: Effective Airway Clearance  Outcome: Ongoing - Unchanged

## 2022-07-09 NOTE — Unmapped (Signed)
Problem: Infection (Pneumonia)  Goal: Resolution of Infection Signs and Symptoms  Outcome: Ongoing - Unchanged     Problem: Respiratory Compromise (Pneumonia)  Goal: Effective Oxygenation and Ventilation  Outcome: Ongoing - Unchanged     Problem: Adjustment to Illness COPD (Chronic Obstructive Pulmonary Disease)  Goal: Optimal Chronic Illness Coping  Outcome: Ongoing - Unchanged     Problem: Functional Ability Impaired COPD (Chronic Obstructive Pulmonary Disease)  Goal: Optimal Level of Functional Independence  Outcome: Ongoing - Unchanged     Problem: Infection COPD (Chronic Obstructive Pulmonary Disease)  Goal: Absence of Infection Signs and Symptoms  Outcome: Ongoing - Unchanged     Problem: Respiratory Compromise COPD (Chronic Obstructive Pulmonary Disease)  Goal: Effective Oxygenation and Ventilation  Outcome: Ongoing - Unchanged     Patient has received treatments and AWC per schedule. Patient has c/o SOB throughout shift, O2 adjusted to try to resolve issue. No acute events of note at this time.

## 2022-07-09 NOTE — Unmapped (Signed)
Family Medicine Inpatient Service    Progress Note    Team: Family Medicine [place] [facility] (pgr [phone])    Hospital Day: 6    ASSESSMENT / PLAN:   Kristopher Obrien is a 71 y.o. male with pmh COPD with bronchiectasis (last CTA 05/10/22) on 4L O2, tobacco use disorder, opioid use disorder, and MDR resistant pseudomonas PNA who presents with dyspnea.     # Acute COPD Exacerbation - Severe COPD and Bronchiectasis - Hx MDR Pseudomonal PNA  Patient with hx of hospitalizations for COPD exacerbations, most recently from 7/5-7/14 in which he grew MDR Achromobacter species. This exacerbation likely 2/2 pneumonia given patient's past history and current CXR findings with mid-lower left lung opacities. LRCx unremarkable. Pt leaning towards going home with home hospice without [employer], seems that insurance may not cover the device regardless. Last day of IV abx tomorrow am.  - BiPAP PRN for pt sx relief  -consult pulmonology, appreciate recs   -Continue Cefepime 2g q8h for total of 7 days (thru 8/3).   - Levaquin 750 mg PO daily for total of 14 days (thru 8/9), will be discharged with remaining days of Levaquin PO.  -Continue Pulmicort 0.5 mg BID, to be also prescribed at discharge  -Continue DuoNeb Q6H, to be also prescribed at discharge for home regimen, albuterol Q4H PRN  -Continue prednisone 40 mg for total of 5 days, then can taper down to 30 mg for 3 days, 20 for 3 days, 10 for 3 days and 5 for 3 days then discontinue.  -Continue airway clearance with DuoNeb QID, hypertonic saline 10% QID, and Aerobika QID all of which should be prescribed at discharge and make sure he has them at home.  -daily bmp, mg, vbg     # Opioid Use Disorder  - Continue home suboxone     #Iron Deficiency Anemia  Last colonoscopy in 08/09/2020, found one advanced adenomatous polyp. Anemia stable at baseline.  -Daily cbc   -Continue home iron supplementation     # Checklist:  - IVF None  - Tubes/Lines/Drains: PIV  - Diet Regular  - Bowel Regimen: Miralax PRN  - DVT: SQ Lovenox  - Code Status:   Orders Placed This Encounter   Procedures    DNR (Do Not Resuscitate) and DNI (Do Not Intubate)     Standing Status:   Standing     Number of Occurrences:   1     - Dispo: Floor    [ ]  Anticipated Discharge Location: Home likely with hospice - pt lives with daughter  [ ]  PT/OT/DME: No needs anticipated  [ ]  CM/SW needs: Home hospice (likely)  [ ]  Meds/Rx:  Not yet prescribed. No special med needs  [ ]  Teaching: None anticipated  [ ]  Follow up appt: Appointment needed  [ ]  Excuse letter: Needed  [ ]  Transport: Private Needed    SUBJECTIVE:  Interval events: Pt feeling about the same as yesterday, not great. Re-endorsed desire to go home on hospice this morning. No new concerns.      REVIEW OF SYSTEMS:  Pertinent positives and negatives per HPI. A complete review of systems otherwise negative.    PHYSICAL EXAM:      Intake/Output Summary (Last 24 hours) at 07/09/2022 0720  Last data filed at 07/09/2022 0000  Gross per 24 hour   Intake 1680 ml   Output 3225 ml   Net -1545 ml         Recent Vitals:  Vitals:  07/09/22 0400   BP: 93/57   Pulse: 89   Resp: 21   Temp: 36.6 ??C (97.8 ??F)   SpO2: 97%       GEN: chronically ill appearing, sitting up in bed, NAD   HEENT: NCAT, No scleral icterus. Conjunctiva non-erythematous. MMM.  CV: Tachycardic rate and regular rhythm. No murmurs/rubs/gallops.  Pulm: Lungs with diffuse crackles, diffuse rhonchi, no wheezing.  Abd: Flat.  Nontender. Soft.  Neuro: A&O x 3. No focal deficits.  Ext: No peripheral edema.  Palpable distal pulses.  Skin: No rashes or skin lesions.   Psych: Normal mood/affect.      LABS/ STUDIES:    All imaging, laboratory studies, and other pertinent tests including electrocardiography within the last 24 hours were reviewed and are summarized within the assessment and plan.     NUTRITION:                           Kirby Crigler, MD,  Family Med PGY2

## 2022-07-09 NOTE — Unmapped (Signed)
Problem: Self-Care Deficit  Goal: Improved Ability to Complete Activities of Daily Living  Outcome: Progressing     Problem: Fall Injury Risk  Goal: Absence of Fall and Fall-Related Injury  Outcome: Progressing  Intervention: Promote Injury-Free Environment  Recent Flowsheet Documentation  Taken 07/09/2022 0700 by Nira Retort, RN  Safety Interventions: fall reduction program maintained     Problem: Skin Injury Risk Increased  Goal: Skin Health and Integrity  Outcome: Progressing  Intervention: Optimize Skin Protection  Recent Flowsheet Documentation  Taken 07/09/2022 1200 by Nira Retort, RN  Head of Bed [facility]) Positioning: HOB at 30 degrees  Taken 07/09/2022 0800 by Nira Retort, RN  Head of Bed [facility]) Positioning: HOB at 30 degrees     Problem: Adult Inpatient Plan of Care  Goal: Plan of Care Review  Outcome: Progressing  Goal: Patient-Specific Goal (Individualized)  Outcome: Progressing  Goal: Absence of Hospital-Acquired Illness or Injury  Outcome: Progressing  Intervention: Identify and Manage Fall Risk  Recent Flowsheet Documentation  Taken 07/09/2022 0700 by Nira Retort, RN  Safety Interventions: fall reduction program maintained  Intervention: Prevent and Manage VTE (Venous Thromboembolism) Risk  Recent Flowsheet Documentation  Taken 07/09/2022 0700 by Nira Retort, RN  Range of Motion: Bilateral Upper and Lower Extremities  Intervention: Prevent Infection  Recent Flowsheet Documentation  Taken 07/09/2022 0700 by Nira Retort, RN  Infection Prevention: hand hygiene promoted  Goal: Optimal Comfort and Wellbeing  Outcome: Progressing  Goal: Readiness for Transition of Care  Outcome: Progressing  Goal: Rounds/Family Conference  Outcome: Progressing     Problem: Infection  Goal: Absence of Infection Signs and Symptoms  Outcome: Progressing  Intervention: Prevent or Manage Infection  Recent Flowsheet Documentation  Taken 07/09/2022 0700 by Nira Retort, RN  Infection Management: aseptic technique maintained     Problem: Impaired Wound Healing  Goal: Optimal Wound Healing  Outcome: Progressing     Problem: Infection (Pneumonia)  Goal: Resolution of Infection Signs and Symptoms  Outcome: Progressing  Intervention: Prevent Infection Progression  Recent Flowsheet Documentation  Taken 07/09/2022 0700 by Nira Retort, RN  Infection Management: aseptic technique maintained     Problem: Respiratory Compromise (Pneumonia)  Goal: Effective Oxygenation and Ventilation  Outcome: Progressing  Intervention: Optimize Oxygenation and Ventilation  Recent Flowsheet Documentation  Taken 07/09/2022 1200 by Nira Retort, RN  Head of Bed [facility]) Positioning: HOB at 30 degrees  Taken 07/09/2022 0800 by Nira Retort, RN  Head of Bed [facility]) Positioning: HOB at 30 degrees     Problem: Adjustment to Illness COPD (Chronic Obstructive Pulmonary Disease)  Goal: Optimal Chronic Illness Coping  Outcome: Progressing

## 2022-07-09 NOTE — Unmapped (Addendum)
Pt currently on 3lpm [place] tolerating well.  All scheduled inhaled medications administered. Airway clearance done via [place]. No apparent distress noted at this time. RT will continue to follow.

## 2022-07-09 NOTE — Unmapped (Signed)
Pt afebrile, vitals stable. Was on 3L but requested to be on 4L for comfort. Productive cough. Tolerated Bipap for a few hours today. Tolerating regular diet. No BM this shift. Voiding adequate yellow urine. Denies pain.     Problem: Self-Care Deficit  Goal: Improved Ability to Complete Activities of Daily Living  Outcome: Progressing     Problem: Fall Injury Risk  Goal: Absence of Fall and Fall-Related Injury  Outcome: Progressing  Intervention: Promote Injury-Free Environment  Recent Flowsheet Documentation  Taken 07/08/2022 1800 by Carin Hock, RN  Safety Interventions: fall reduction program maintained  Taken 07/08/2022 1600 by Carin Hock, RN  Safety Interventions: fall reduction program maintained  Taken 07/08/2022 1200 by Carin Hock, RN  Safety Interventions: fall reduction program maintained  Taken 07/08/2022 0800 by Carin Hock, RN  Safety Interventions: fall reduction program maintained     Problem: Skin Injury Risk Increased  Goal: Skin Health and Integrity  Outcome: Progressing  Intervention: Optimize Skin Protection  Recent Flowsheet Documentation  Taken 07/08/2022 1800 by Carin Hock, RN  Head of Bed [facility]) Positioning: HOB at 30-45 degrees  Taken 07/08/2022 1600 by Carin Hock, RN  Head of Bed [facility]) Positioning: HOB at 30-45 degrees  Taken 07/08/2022 1400 by Carin Hock, RN  Head of Bed [facility]) Positioning: HOB at 30-45 degrees  Taken 07/08/2022 1200 by Carin Hock, RN  Head of Bed [facility]) Positioning: HOB at 30-45 degrees  Taken 07/08/2022 1000 by Carin Hock, RN  Head of Bed [facility]) Positioning: HOB at 30-45 degrees  Taken 07/08/2022 0800 by Carin Hock, RN  Pressure Reduction Techniques:   frequent weight shift encouraged   heels elevated off bed  Head of Bed (HOB) Positioning: HOB at 30-45 degrees  Pressure Reduction Devices:   specialty bed utilized   pressure-redistributing mattress utilized   positioning supports utilized  Skin Protection:   adhesive use limited incontinence pads utilized

## 2022-07-09 NOTE — Unmapped (Signed)
General Pulmonary Team Follow Up Consult Note     Date of Service: 07/09/2022  Requesting Physician: Leitha Schuller, MD   Requesting Service: Family Medicine [facility])  Reason for consultation: Comprehensive evaluation of  COPD vs Bronchiectasis exacerbation .    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 male with a past medical history significant pertinent for GOLD 4 Group E COPD (last PFTs 07/27/20) on 4 L [place] at home, bronchiectasis complicated by multiple prior exacerbations (last discharged 06/20/22 from [facility]) with prior cultures growing multi-drug-resistant PsA and MDR Achromobacter, prior pulmonary hypertension by echocardiography (2021), history of tobacco use disorder (quit 2-3 years ago, 50+ pk-yrs), opioid use disorder, who presented 7/27 with dyspnea.     Problems addressed during this consult include acute hypercapneic respiratory failure acute hypoxic respiratory failure acute on chronic respiratory failure bronchiectasis COPD exacerbation history of MDR Pseudomonas infection, tobacco use disorder  . Based on these problems, the patient has high risk of morbidity/mortality which is commensurate w their risk of management options described below in the recommendations.    Assessment      Interval Events: Still very breathless. He is considering hospice but worries it will not cover his pocket vent - I did explain this was for symptom control and if the overall goals of hospice is what he wants not to let that be a barrier today.     Impression:  I personally reviewed most recent pertinent labs, imaging and micro data, from 07/09/2022, which are signficant for stable but significant respiratory acidosis:    Recommendations     #Acute COPD Exacerbation vs Bronchiectasis Exacerbation 2/2 superimposed LLL pneumonia   #GOLD Stage E COPD  #Bronchiectasis c/b history of MDR PsA and MDR Achromobacter   Patient has had multiple hospitalizations for COPD vs Bronchiectasis exacerbations, with his most recent from 7/5-7/14.  CXR consistent with LLL pneumonia, which is likely exacerbating factor. Patient was restarted on Levofloxacin on admission given prior susceptibility and had Cefepime added for double GNR coverage to include PsA. RPP negative. LRCx showing OP flora.  Prior history of MDR Pseudomonas and Achromobacter.    -Continue Levaquin for total of 14 days and Cefepime for total of 7 days.  Patient should stay in the hospital till he completes 7-day course of cefepime and then can be discharged with remaining 7 days of Levaquin to be completed at home.  -Continue Pulmicort 0.5 mg BID, to be also prescribed at discharge  -Continue DuoNeb Q6H, to be also prescribed at discharge for home regimen, albuterol Q4H PRN  -Continue prednisone 40 mg for total of 5 days, then can taper down to 30 mg for 3 days, 20 for 3 days, 10 for 3 days and 5 for 3 days then discontinue then.  -Continue airway clearance with DuoNeb QID, hypertonic saline 10% QID, and Aerobika QID all of which should be prescribed at discharge and make sure he has them at home.  -Wean oxygen via [place] to  goal SpO2 88-92%  -Hold inhaled Tobramycin while in acute bronchiectasis exacerbation and in setting of double GNR coverage. Plan to restart upon discharge for 28 day course.  -Is not tolerating nocturnal BiPAP and does not wish to have at home  -Team needs to contact his wound/B[place], number provided, 1-2 days prior to discharge up at home if he desires to go with the life2000    Discussed with team today - he may still be interested in hospice, will follow up and aid as we can.     Please do not hesitate to page [phone] [facility] consult) with questions. We appreciate the opportunity to assist in the care of this patient. We look forward to following with you.    Pablo Ledger, MD    Subjective & Objective     Vitals - past 24 hours  Temp:  [36.5 ??C (97.7 ??F)-37 ??C (98.6 ??F)] 36.8 ??C (98.2 ??F)  Heart Rate:  [84-109] 98  SpO2 Pulse:  [77-113] 89  Resp:  [8-29] 18  BP: (92-117)/(51-77) 97/51  SpO2:  [95 %-100 %] 95 % Intake/Output  I/O last 3 completed shifts:  In: 4180 [P.O.:3680; IV Piggyback:500]  Out: 5625 [Urine:5625]      Pertinent exam findings:     Gen: Patient laying in bed, awake, alert, oriented to time place and person, mild pallor, no icterus,  cyanosis, mild clubbing.  Head neck ENT: No JVD, no lymphadenopathy-cervical or supraclavicular, no thyromegaly, moist mucosa, normal oropharynx  CVS: S1-S2 heard normally, no murmurs appreciated, no rubs or gallops  Respiratory: Distant breath sounds bilaterally with bilateral coarse sounds   Abdomen: Soft, nontender, nondistended, no organomegaly, bowel sounds present  Neuro: Nonfocal neurological exam, no sensory deficits, cranial nerves grossly intact  Skin extremities: No rash, no pedal edema    Relevant Imaging:  - CXR on 07/03/22 revealed increased opacity in the mid to lower left lung superimposed on chronic parenchymal changes, raising suspicion for acute pneumonia.   - CT chest without contrast on 05/16/22 revealed stable multifocal consolidation related to prior episodes of infection or aspiration with no acute consolidation. Fixed stricture bronchus intermedius without change. There are secretions at this level. This may be related to prior infection or aspiration. Significant coronary artery and aortic valve calcification. Correlate cardiac echo as indicated to assess for possible aortic stenosis. Extensive centrilobular and paraseptal emphysema.    Arterial Blood Gas:   No results in the last day     Venous Blood Gas:   Recent Labs   Lab Units 07/09/22  0552   PH VEN  7.33   PCO2 VEN mm Hg 71*   PO2 VEN mm Hg 34*   HCO3 VEN mmol/L 32*   BASE EXC VEN  11.2*   O2 SAT VEN % 58.0        Cultures:  Lower Respiratory Culture (no units)   Date Value   07/04/2022 OROPHARYNGEAL FLORA ISOLATED   06/15/2022 2+ Achromobacter species (A)   06/15/2022 3+ Oropharyngeal Flora Isolated     WBC (10*9/L)   Date Value   07/09/2022 16.7 (H)          Other Labs:  Lab Results   Component Value Date    WBC 16.7 (H) 07/09/2022    HGB 9.5 (L) 07/09/2022    HCT 29.9 (L) 07/09/2022    PLT 342 07/09/2022     Lab Results   Component Value Date    NA 139 07/09/2022    K 4.1  07/09/2022    CL 100 07/09/2022    CO2 36.0 (H) 07/09/2022    BUN 15 07/09/2022    CREATININE 0.60 07/09/2022    GLU 92 07/09/2022    CALCIUM 8.8 07/09/2022    MG 2.3 07/09/2022    PHOS 2.5 05/07/2022     Lab Results   Component Value Date    BILITOT 0.2 (L) 07/03/2022    BILIDIR <0.10 11/03/2021    PROT 7.5 07/03/2022    ALBUMIN 2.6 (L) 07/03/2022    ALT <7 (L) 07/03/2022    AST 12 07/03/2022    ALKPHOS 104 07/03/2022     Lab Results   Component Value Date    INR 1.20 05/16/2021    APTT 31.3 05/05/2021       Allergies & Home Medications   Personally reviewed in Epic    Continuous Infusions:       Scheduled Medications:    budesonide  0.5 mg Nebulization BID (RT)    buprenorphine-naloxone  1 tablet Sublingual Q8H SCH    Cefepime  2 g Intravenous Q8H SCH    enoxaparin (LOVENOX) injection  40 mg Subcutaneous Nightly    ferrous sulfate  325 mg Oral Every other day    gabapentin  900 mg Oral TID    ipratropium-albuteroL  3 mL Nebulization Q6H (RT)    levoFLOXacin  750 mg Oral Q24H SCH    lidocaine  2 patch Transdermal Daily    predniSONE  30 mg Oral Daily    Followed by    Melene Muller ON 07/12/2022] predniSONE  20 mg Oral Daily    Followed by    Melene Muller ON 07/15/2022] predniSONE  10 mg Oral Daily    Followed by    Melene Muller ON 07/18/2022] predniSONE  5 mg Oral Daily    sodium chloride  5 mL Nebulization QID       PRN medications:  albuterol, polyethylene glycol

## 2022-07-09 NOTE — Unmapped (Signed)
ADVANCE CARE PLANNING NOTE    Discussion Date:  July 08, 2022    Patient has decisional capacity:  Yes    Patient has selected a Health Care Decision-Maker if loses capacity: Yes    Health Care Decision Maker as of 07/08/2022    HCDM (patient stated preference): perkins,anthony - Son in [place] [phone]    HCDM (patient stated preference): Alvia Grove - Daughter - [phone]    Discussion Participants:  Patient  Drake Leach MD    Communication of Medical Status/Prognosis:   Severe COPD and bronchiectasis with frequent hospitalizations, dyspnea with minimal exertion, life expectancy likely less than 6 months    Communication of Treatment Goals/Options:   Pt prioritizes being able to spend time with family, visit with a few close friends, eat home cooked meals.  Would like to be at home but is fearful of the process of transport home. In the past has taken taxi home and doesn't feel that he has the strength to be able to get home by taxi.  Does not want to suffer, and in particular is scared of dyspnea and feeling like he is gasping like a fish out of water.  Expresses that he in the past he was fearful of dying at home, because he was worried about what it would be like for his family (in particular his young adult granddaughter) to find him dead, but he has talked this over with his family and this is now less of a fear. He feels confident that they can handle being with him through the end of life.  Has tried Life2000 machine and felt that it was very helpful relieving dyspnea. Would like to be able to use it at home. Has found BiPAP and CPAP very difficult to tolerate and would not want to use them in the home. We have reviewed that hospice would not be able to provide Life2000 machine per our care management team.  We reviewed the kinds of supports that hospice would be able to provide, including medications for management of dyspnea and other uncomfortable symptoms, on call support for managing symptoms in the home to help with avoidance of hospitalization, bereavement support for family.    Treatment Decisions:   Pt is interested in hospice care but also feels very hopeful about the symptom relief that Life2000 and is frustrated that it would not be provided by hospice. He is undecided at this time re whether he wants to prioritize Life2000 or hospice care. Will discuss further as discharge plans more forward.      I spent 60 minutes providing voluntary advance care planning services for this patient.

## 2022-07-10 ENCOUNTER — Inpatient Hospital Stay: Admit: 2022-07-10 | Payer: MEDICARE

## 2022-07-10 ENCOUNTER — Encounter: Admit: 2022-07-10 | Discharge: 2022-07-15 | Payer: MEDICARE

## 2022-07-10 ENCOUNTER — Ambulatory Visit: Admit: 2022-07-10 | Discharge: 2022-07-15 | Payer: MEDICARE

## 2022-07-10 LAB — BASIC METABOLIC PANEL
ANION GAP: 4 mmol/L — ABNORMAL LOW (ref 5–14)
BLOOD UREA NITROGEN: 16 mg/dL (ref 9–23)
BUN / CREAT RATIO: 27
CALCIUM: 9.1 mg/dL (ref 8.7–10.4)
CHLORIDE: 99 mmol/L (ref 98–107)
CO2: 37.2 mmol/L — ABNORMAL HIGH (ref 20.0–31.0)
CREATININE: 0.59 mg/dL — ABNORMAL LOW
EGFR CKD-EPI (2021) MALE: >90 mL/min/{1.73_m2} (ref >=60)
GLUCOSE RANDOM: 81 mg/dL (ref 70–179)
POTASSIUM: 3.8 mmol/L (ref 3.4–4.8)
SODIUM: 140 mmol/L (ref 135–145)

## 2022-07-10 LAB — CBC
HEMATOCRIT: 28.4 % — ABNORMAL LOW (ref 39.0–48.0)
HEMOGLOBIN: 9 g/dL — ABNORMAL LOW (ref 12.9–16.5)
MEAN CORPUSCULAR HEMOGLOBIN CONC: 31.7 g/dL — ABNORMAL LOW (ref 32.0–36.0)
MEAN CORPUSCULAR HEMOGLOBIN: 27.1 pg (ref 25.9–32.4)
MEAN CORPUSCULAR VOLUME: 85.5 fL (ref 77.6–95.7)
MEAN PLATELET VOLUME: 7.5 fL (ref 6.8–10.7)
PLATELET COUNT: 335 10*9/L (ref 150–450)
RED BLOOD CELL COUNT: 3.32 10*12/L — ABNORMAL LOW (ref 4.26–5.60)
RED CELL DISTRIBUTION WIDTH: 16 % — ABNORMAL HIGH (ref 12.2–15.2)
WBC ADJUSTED: 17 10*9/L — ABNORMAL HIGH (ref 3.6–11.2)

## 2022-07-10 LAB — BLOOD GAS, VENOUS
BASE EXCESS VENOUS: 13.7 — ABNORMAL HIGH (ref -2.0–2.0)
HCO3 VENOUS: 35 mmol/L — ABNORMAL HIGH (ref 22–27)
O2 SATURATION VENOUS: 54.8 % (ref 40.0–85.0)
PCO2 VENOUS: 73 mmHg (ref 40–60)
PH VENOUS: 7.34 (ref 7.32–7.43)
PO2 VENOUS: 33 mmHg — ABNORMAL LOW (ref 35–40)

## 2022-07-10 LAB — MAGNESIUM: MAGNESIUM: 2.3 mg/dL (ref 1.6–2.6)

## 2022-07-10 MED ORDER — SODIUM CHLORIDE 10 % FOR NEBULIZATION
Freq: Four times a day (QID) | RESPIRATORY_TRACT | 0 refills | 30.00000 days | Status: CP
Start: 2022-07-10 — End: 2022-07-10

## 2022-07-10 MED ORDER — IPRATROPIUM 0.5 MG-ALBUTEROL 3 MG (2.5 MG BASE)/3 ML NEBULIZATION SOLN
Freq: Four times a day (QID) | RESPIRATORY_TRACT | 0 refills | 30.00000 days | Status: CP
Start: 2022-07-10 — End: 2022-08-09
  Filled 2022-07-10: qty 360, 30d supply, fill #0

## 2022-07-10 MED ORDER — BUDESONIDE 0.5 MG/2 ML SUSPENSION FOR NEBULIZATION
Freq: Two times a day (BID) | RESPIRATORY_TRACT | 11 refills | 30.00000 days | Status: CP
Start: 2022-07-10 — End: 2023-07-10
  Filled 2022-07-10: qty 60, 15d supply, fill #0

## 2022-07-10 MED ADMIN — buprenorphine-naloxone (SUBOXONE) 8-2 mg SL tablet 8 mg of buprenorphine: 1 | SUBLINGUAL | @ 18:00:00 | Stop: 2022-07-10

## 2022-07-10 MED ADMIN — gabapentin (NEURONTIN) capsule 900 mg: 900 mg | ORAL | @ 18:00:00 | Stop: 2022-07-10

## 2022-07-10 MED ADMIN — sodium chloride 10 % NEBULIZER solution 5 mL: 5 mL | RESPIRATORY_TRACT | @ 08:00:00 | Stop: 2022-07-10

## 2022-07-10 MED ADMIN — cefepime (MAXIPIME) 2 g in sodium chloride 0.9 % (NS) 100 mL IVPB-MBP: 2 g | INTRAVENOUS | @ 10:00:00 | Stop: 2022-07-10

## 2022-07-10 MED ADMIN — predniSONE (DELTASONE) tablet 30 mg: 30 mg | ORAL | @ 12:00:00 | Stop: 2022-07-10

## 2022-07-10 MED ADMIN — gabapentin (NEURONTIN) capsule 900 mg: 900 mg | ORAL | @ 02:00:00

## 2022-07-10 MED ADMIN — ipratropium-albuteroL (DUO-NEB) 0.5-2.5 mg/3 mL nebulizer solution 3 mL: 3 mL | RESPIRATORY_TRACT | @ 01:00:00

## 2022-07-10 MED ADMIN — cefepime (MAXIPIME) 2 g in sodium chloride 0.9 % (NS) 100 mL IVPB-MBP: 2 g | INTRAVENOUS | @ 18:00:00 | Stop: 2022-07-10

## 2022-07-10 MED ADMIN — buprenorphine-naloxone (SUBOXONE) 8-2 mg SL tablet 8 mg of buprenorphine: 1 | SUBLINGUAL | @ 10:00:00 | Stop: 2022-07-10

## 2022-07-10 MED ADMIN — gabapentin (NEURONTIN) capsule 900 mg: 900 mg | ORAL | @ 12:00:00 | Stop: 2022-07-10

## 2022-07-10 MED ADMIN — cefepime (MAXIPIME) 2 g in sodium chloride 0.9 % (NS) 100 mL IVPB-MBP: 2 g | INTRAVENOUS | @ 02:00:00 | Stop: 2022-07-10

## 2022-07-10 MED ADMIN — sodium chloride 10 % NEBULIZER solution 5 mL: 5 mL | RESPIRATORY_TRACT | @ 13:00:00 | Stop: 2022-07-10

## 2022-07-10 MED ADMIN — ipratropium-albuteroL (DUO-NEB) 0.5-2.5 mg/3 mL nebulizer solution 3 mL: 3 mL | RESPIRATORY_TRACT | @ 08:00:00 | Stop: 2022-07-10

## 2022-07-10 MED ADMIN — lidocaine (LIDODERM) 5 % patch 2 patch: 2 | TRANSDERMAL | @ 12:00:00 | Stop: 2022-07-10

## 2022-07-10 MED ADMIN — sodium chloride 10 % NEBULIZER solution 5 mL: 5 mL | RESPIRATORY_TRACT | @ 01:00:00

## 2022-07-10 MED ADMIN — ipratropium-albuteroL (DUO-NEB) 0.5-2.5 mg/3 mL nebulizer solution 3 mL: 3 mL | RESPIRATORY_TRACT | @ 13:00:00 | Stop: 2022-07-10

## 2022-07-10 MED ADMIN — ipratropium-albuteroL (DUO-NEB) 0.5-2.5 mg/3 mL nebulizer solution 3 mL: 3 mL | RESPIRATORY_TRACT | @ 17:00:00 | Stop: 2022-07-10

## 2022-07-10 MED ADMIN — sodium chloride 10 % NEBULIZER solution 5 mL: 5 mL | RESPIRATORY_TRACT | @ 17:00:00 | Stop: 2022-07-10

## 2022-07-10 MED ADMIN — budesonide (PULMICORT) nebulizer solution 0.5 mg: .5 mg | RESPIRATORY_TRACT | @ 13:00:00 | Stop: 2022-07-10

## 2022-07-10 MED ADMIN — enoxaparin (LOVENOX) syringe 40 mg: 40 mg | SUBCUTANEOUS | @ 02:00:00

## 2022-07-10 MED ADMIN — budesonide (PULMICORT) nebulizer solution 0.5 mg: .5 mg | RESPIRATORY_TRACT | @ 01:00:00

## 2022-07-10 MED ADMIN — ferrous sulfate tablet 325 mg: 325 mg | ORAL | @ 12:00:00 | Stop: 2022-07-10

## 2022-07-10 MED ADMIN — buprenorphine-naloxone (SUBOXONE) 8-2 mg SL tablet 8 mg of buprenorphine: 1 | SUBLINGUAL | @ 02:00:00

## 2022-07-10 MED ADMIN — levoFLOXacin (LEVAQUIN) tablet 750 mg: 750 mg | ORAL | @ 12:00:00 | Stop: 2022-07-10

## 2022-07-10 NOTE — Unmapped (Signed)
Pt received all scheduled breathing tx.  Aerobika done for airway clearance.  Pt has a productive cough with large amounts of sputum noted, pt uses yankauer to sxn as needed.  Stable on 4L [place].    Problem: Airway Clearance Ineffective  Goal: Effective Airway Clearance  Outcome: Ongoing - Unchanged

## 2022-07-10 NOTE — Unmapped (Signed)
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 *      Outpatient Provider Follow Up Issues:   [ ]  may consider transition from suboxone to morphine for air hunger     Hospital Course:   Kristopher Obrien is a 71 y.o. male with pmh COPD with bronchiectasis (last CTA 05/10/22) on 4L O2, tobacco use disorder, opioid use disorder, and MDR resistant pseudomonas PNA who presents with dyspnea.     # Acute COPD Exacerbation - Severe COPD and Bronchiectasis - Hx MDR Pseudomonal PNA  Patient with hx of hospitalizations for COPD exacerbations, most recently from 7/5-7/14 in which he grew MDR Achromobacter species. This exacerbation likely 2/2 pneumonia given patient's past history and current CXR findings with mid-lower left lung opacities. LRCx unremarkable. Pt elected to go home with home hospice after long GOC discussion. Last day of IV abx completed prior to discharge, will be sent home with remaining days of PO levaquin 750 mg through 8/9. Also discharged on prednisone taper per pulmonology. Continue airway clearance with DuoNeb QID, hypertonic saline 10% QID, and Aerobika QID all of which were prescribed  at discharge. Restarted tobramycin nebs on discharge.     # Opioid Use Disorder: Continued home suboxone     #Iron Deficiency Anemia: Last colonoscopy in 08/09/2020, found one advanced adenomatous polyp. Anemia stable at baseline. Continued on oral iron throughout hospitalization.       Touchbase with Outpatient Provider:  Warm Handoff: Completed on 07/10/22 by [name]  (Resident) via [facility]    Procedures:  None  No admission procedures for hospital encounter.  ______________________________________________________________________  Discharge Medications:     Your Medication List        STOP taking these medications      arformoteroL 15 mcg/2 mL  Commonly known as: BROVANA     azithromycin 250 MG tablet  Commonly known as: ZITHROMAX     naproxen 500 MG tablet  Commonly known as: NAPROSYN            START taking these medications      levoFLOXacin 750 MG tablet  Commonly known as: LEVAQUIN  Take 1 tablet (750 mg total) by mouth daily for 5 days.  Start taking on: July 11, 2022     predniSONE 10 MG tablet  Commonly known as: DELTASONE  Take 3 tablets (30 mg total) by mouth daily for  3 days.  Start taking on: July 11, 2022     predniSONE 20 MG tablet  Commonly known as: DELTASONE  Take 1 tablet (20 mg total) by mouth daily for 3 days.  Start taking on: July 12, 2022     predniSONE 10 MG tablet  Commonly known as: DELTASONE  Take 1 tablet (10 mg total) by mouth daily for 3 days.  Start taking on: July 15, 2022     predniSONE 5 MG tablet  Commonly known as: DELTASONE  Take 1 tablet (5 mg total) by mouth in the morning for 3 days.  Start taking on: July 18, 2022            CHANGE how you take these medications      budesonide 0.5 mg/2 mL nebulizer solution  Commonly known as: PULMICORT  Inhale 2 mL (0.5 mg total) by nebulization in the morning and 2 mL (0.5 mg total) in the evening.  What changed: when to take this     ipratropium-albuteroL 0.5-2.5 mg/3 mL nebulizer  Commonly known as: DUO-NEB  Inhale 3 mL by nebulization every six (6) hours.  What changed:   how much to take  when to take this     sodium chloride 10 % Nebu  Inhale 5 mL by nebulization four (4) times a day.  What changed: when to take this            CONTINUE taking these medications      AEROBIKA OSCILLATING PEP SYSTM Devi  Generic drug: mucus clearing device  1 Dose by Miscellaneous route two (2) times a day.     albuterol 90 mcg/actuation inhaler  Commonly known as: PROVENTIL HFA;VENTOLIN HFA  Inhale 2 puffs every four (4) hours as needed for wheezing or shortness of breath.     aspirin 325 MG tablet  Take 2 tablets (650 mg total) by mouth daily as needed (headache).     BETHKIS 300 mg/4 mL Nebu  Generic drug: tobramycin  Inhale the contents of 1 vial (300 mg) via nebulizer Two (2) times a day. Alternate 28 days on, 28 days off.     buprenorphine-naloxone 8-2 mg sublingual tablet  Commonly known as: SUBOXONE  Place 1 tablet (8 mg of buprenorphine total) under the tongue Three (3) times a day for 5 days.     famotidine 20 MG tablet  Commonly known as: PEPCID  Take 1 tablet (20 mg total) by mouth Two (2) times a day.     ferrous sulfate 325 (65 FE) MG tablet  Take 1 tablet (325 mg total) by mouth every other day.     gabapentin 300 MG capsule  Commonly known as: NEURONTIN  Take 3 capsules (900 mg total) by mouth Three (3) times a day.     LC PLUS Misc  Generic drug: nebulizers  Use as directed with inhaled medication              Allergies:  Patient has no known allergies.  ______________________________________________________________________  Pending Test Results (if blank, then none):      Most Recent Labs:  All lab results last 24 hours -   Recent Results (from the past 24 hour(s))   CBC    Collection Time: 07/10/22  5:25 AM   Result Value Ref Range    WBC 17.0 (H) 3.6 - 11.2 10*9/L    RBC 3.32 (L) 4.26 - 5.60 10*12/L    HGB 9.0 (L) 12.9 - 16.5 g/dL  HCT 28.4 (L) 39.0 - 48.0 %    MCV 85.5 77.6 - 95.7 fL    MCH 27.1 25.9 - 32.4 pg    MCHC 31.7 (L) 32.0 - 36.0 g/dL    RDW 32.9 (H) 51.8 - 15.2 %    MPV 7.5 6.8 - 10.7 fL    Platelet 335 150 - 450 10*9/L   Magnesium Level    Collection Time: 07/10/22  5:25 AM   Result Value Ref Range    Magnesium 2.3 1.6 - 2.6 mg/dL   Blood Gas, Venous    Collection Time: 07/10/22  5:25 AM   Result Value Ref Range    Specimen Source Venous     FIO2 Venous Not Specified     pH, Venous 7.34 7.32 - 7.43    pCO2, Ven 73 (HH) 40 - 60 mm Hg    pO2, Ven 33 (L) 35 - 40 mm Hg    HCO3, Ven 35 (H) 22 - 27 mmol/L    Base Excess, Ven 13.7 (H) -2.0 - 2.0    O2 Saturation, Venous 54.8 40.0 - 85.0 %   Basic Metabolic Panel    Collection Time: 07/10/22  5:25 AM   Result Value Ref Range    Sodium 140 135 - 145 mmol/L    Potassium 3.8 3.4 - 4.8 mmol/L    Chloride 99 98 - 107 mmol/L    CO2 37.2 (H) 20.0 - 31.0 mmol/L    Anion Gap 4 (L) 5 - 14 mmol/L    BUN 16 9 - 23 mg/dL    Creatinine 8.41 (L) 0.60 - 1.10 mg/dL    BUN/Creatinine Ratio 27     eGFR CKD-EPI (2021) Male >90 >=60 mL/min/1.63m2    Glucose 81 70 - 179 mg/dL    Calcium 9.1 8.7 - 66.0 mg/dL       Relevant Studies/Radiology (if blank, then none):  ECG 12 Lead    Result Date: 07/04/2022  SINUS TACHYCARDIA OTHERWISE NORMAL ECG WHEN COMPARED WITH ECG OF 11-Jun-2022 23:17, NO SIGNIFICANT CHANGE WAS FOUND Confirmed by Christella Noa ([id number]) on 07/04/2022 8:31:55 AM    XR Chest 2 views    Result Date: 07/03/2022  EXAM: XR CHEST 2 VIEWS DATE: 07/03/2022 8:51 PM ACCESSION: [id number] UN DICTATED: 07/03/2022 8:59 PM INTERPRETATION LOCATION: [facility] CLINICAL INDICATION: 71 years old Male with DYSPNEA ; Dyspnea  TECHNIQUE: PA and Lateral Chest Radiographs. COMPARISON: Chest radiograph 06/11/2022, CT chest 05/16/2022 FINDINGS: Unchanged cardiac silhouette size. Redemonstrated emphysema and fibrotic changes in the lungs. The right lung appears similar when compared with the prior radiograph. Opacities in the mid to lower left lung have increased since the prior radiograph. No pneumothorax. IMPRESSION: Increased opacity in the mid to lower left lung superimposed on chronic parenchymal changes, raising suspicion for acute pneumonia.    ______________________________________________________________________  Discharge Instructions:               Resources and Referrals       Walker      Type: Rolling    Wheels: 4 Wheels & Seat    Length of Need: 99      Home health has been set up for through the agency listed below. The Home health agency will be contacting you to set up a time for them to come see you in your home within 2 days of your discharge.  If you have not heard from them prior to 07/07/22 or you have any questions about home health, please contact them  at the phone number listed below.    [facility] - Admitted Since 07/03/2022       Service Provider Selected Services Address Phone Fax Patient Preferred    [facility] [address] [place] [address] [phone] --                      Follow Up instructions and Outpatient Referrals     Referral to hospice      Facility Type: Home-based    Did you call Hospice before placing this order?: Yes    Do you want ongoing co-management?: No    Care coordination required?: No        Appointments which have been scheduled for you      Jul 10, 2022 To Be Determined  SN - HOSPICE ADMISSION EVAL with Jamesetta So, RN  [facility] AND HOSPICE [facility]) [address]  [address]  [place] [address]  [phone]        Aug 06, 2022  9:30 AM  (Arrive by 9:15 AM)  RETURN COPD with Marlyne Beards, MD  [facility] [place] [facility]) [address]  [place] [address]  [phone]             ______________________________________________________________________  Discharge Day Services:  BP 102/58  - Pulse 87  - Temp 36.7 ??C (98.1 ??F) (Oral)  - Resp 9  - Ht 177.8 cm (5' 10)  - Wt 61.1 kg (134 lb 9.6 oz)  - SpO2 100%  - BMI 19.31 kg/m??   Pt seen on the day of discharge and determined appropriate for discharge.    GEN: NAD   CV: Regular rate   Pulm:bilat wheezes, incr wob   Neuro: A&O x 3. No focal deficits.   Ext: No peripheral edema.  Palpable distal pulses.    Condition at Discharge: fair    Length of Discharge: I spent greater than 30 mins in the discharge of this patient.

## 2022-07-10 NOTE — Unmapped (Signed)
Pt stepdown status. Aox4. NSR-ST, rates 70s-100s, bp normotensive. Remains on 4L [place], sats >92%. Adequate UOP, good PO fluid intake. No bm this shift. Pt declining repositioning, education provided at bedside, endorses turning self. Bed locked and in lowest position, call bell within reach. Plan to continue respiratory interventions, symptom management, IV abx.   Problem: Self-Care Deficit  Goal: Improved Ability to Complete Activities of Daily Living  Outcome: Progressing  Intervention: Promote Activity and Functional Independence  Recent Flowsheet Documentation  Taken 07/09/2022 2000 by Johnsie Cancel, RN  Self-Care Promotion:   independence encouraged   BADL personal objects within reach   BADL personal routines maintained     Problem: Fall Injury Risk  Goal: Absence of Fall and Fall-Related Injury  Outcome: Progressing  Intervention: Identify and Manage Contributors  Recent Flowsheet Documentation  Taken 07/09/2022 2000 by Johnsie Cancel, RN  Self-Care Promotion:   independence encouraged   BADL personal objects within reach   BADL personal routines maintained  Intervention: Promote Injury-Free Environment  Recent Flowsheet Documentation  Taken 07/09/2022 2000 by Johnsie Cancel, RN  Safety Interventions:   fall reduction program maintained   infection management   lighting adjusted for tasks/safety   low bed   nonskid shoes/slippers when out of bed     Problem: Skin Injury Risk Increased  Goal: Skin Health and Integrity  Outcome: Progressing  Intervention: Optimize Skin Protection  Recent Flowsheet Documentation  Taken 07/10/2022 0000 by Johnsie Cancel, RN  Pressure Reduction Techniques: frequent weight shift encouraged  Taken 07/09/2022 2200 by Johnsie Cancel, RN  Pressure Reduction Techniques: pressure points protected  Taken 07/09/2022 2000 by Johnsie Cancel, RN  Pressure Reduction Techniques: frequent weight shift encouraged  Pressure Reduction Devices: pressure-redistributing mattress utilized  Skin Protection:   adhesive use limited   tubing/devices free from skin contact     Problem: Adult Inpatient Plan of Care  Goal: Plan of Care Review  Outcome: Progressing  Goal: Patient-Specific Goal (Individualized)  Outcome: Progressing  Goal: Absence of Hospital-Acquired Illness or Injury  Outcome: Progressing  Intervention: Identify and Manage Fall Risk  Recent Flowsheet Documentation  Taken 07/09/2022 2000 by Johnsie Cancel, RN  Safety Interventions:   fall reduction program maintained   infection management   lighting adjusted for tasks/safety   low bed   nonskid shoes/slippers when out of bed  Intervention: Prevent Skin Injury  Recent Flowsheet Documentation  Taken 07/09/2022 2000 by Johnsie Cancel, RN  Skin Protection:   adhesive use limited   tubing/devices free from skin contact  Intervention: Prevent and Manage VTE (Venous Thromboembolism) Risk  Recent Flowsheet Documentation  Taken 07/09/2022 2000 by Johnsie Cancel, RN  VTE Prevention/Management: anticoagulant therapy  Intervention: Prevent Infection  Recent Flowsheet Documentation  Taken 07/09/2022 2000 by Johnsie Cancel, RN  Infection Prevention:   hand hygiene promoted   personal protective equipment utilized   rest/sleep promoted   single patient room provided  Goal: Optimal Comfort and Wellbeing  Outcome: Progressing  Goal: Readiness for Transition of Care  Outcome: Progressing  Goal: Rounds/Family Conference  Outcome: Progressing     Problem: Infection  Goal: Absence of Infection Signs and Symptoms  Outcome: Progressing  Intervention: Prevent or Manage Infection  Recent Flowsheet Documentation  Taken 07/09/2022 2000 by Johnsie Cancel, RN  Infection Management: aseptic technique maintained  Isolation Precautions: contact precautions maintained     Problem: Impaired Wound Healing  Goal: Optimal Wound Healing  Outcome: Progressing  Problem: Infection (Pneumonia)  Goal: Resolution of Infection Signs and Symptoms  Outcome: Progressing  Intervention: Prevent Infection Progression  Recent Flowsheet Documentation  Taken 07/09/2022 2000 by Johnsie Cancel, RN  Infection Management: aseptic technique maintained  Isolation Precautions: contact precautions maintained     Problem: Respiratory Compromise (Pneumonia)  Goal: Effective Oxygenation and Ventilation  Outcome: Progressing     Problem: Adjustment to Illness COPD (Chronic Obstructive Pulmonary Disease)  Goal: Optimal Chronic Illness Coping  Outcome: Progressing     Problem: Functional Ability Impaired COPD (Chronic Obstructive Pulmonary Disease)  Goal: Optimal Level of Functional Independence  Outcome: Progressing  Intervention: Optimize Functional Ability  Recent Flowsheet Documentation  Taken 07/09/2022 2000 by Johnsie Cancel, RN  Self-Care Promotion:   independence encouraged   BADL personal objects within reach   BADL personal routines maintained     Problem: Infection COPD (Chronic Obstructive Pulmonary Disease)  Goal: Absence of Infection Signs and Symptoms  Outcome: Progressing  Intervention: Prevent or Manage Infection  Recent Flowsheet Documentation  Taken 07/09/2022 2000 by Johnsie Cancel, RN  Infection Management: aseptic technique maintained  Isolation Precautions: contact precautions maintained     Problem: Oral Intake Inadequate COPD (Chronic Obstructive Pulmonary Disease)  Goal: Improved Nutrition Intake  Outcome: Progressing     Problem: Respiratory Compromise COPD (Chronic Obstructive Pulmonary Disease)  Goal: Effective Oxygenation and Ventilation  Outcome: Progressing     Problem: Pain Chronic (Persistent) (Comorbidity Management)  Goal: Acceptable Pain Control and Functional Ability  Outcome: Progressing

## 2022-07-10 NOTE — Unmapped (Signed)
Patient A&O x4. No PRN needed. Refused turns/turning self.     Patient slightly hypotensive. Provider aware. VS otherwise stable.     Breathing on 4L [place]. No desats.     Eating regular diet. Voiding via urinal. Adequate output. No BM this shift.     RN went over discharge paperwork with patient who verbalized understanding. PIV removed. Patient discharge via BLS at 1700.     Donny Pique, RN       Problem: Self-Care Deficit  Goal: Improved Ability to Complete Activities of Daily Living  07/10/2022 1324 by Kandis Cocking, RN  Outcome: Progressing  07/10/2022 1321 by Kandis Cocking, RN  Outcome: Ongoing - Unchanged

## 2022-07-10 NOTE — Unmapped (Signed)
Family Medicine Inpatient Service    Progress Note    Team: Family Medicine [facility] (pgr [phone])    Hospital Day: 7    ASSESSMENT / PLAN:   Kristopher Obrien is a 71 y.o. male with pmh COPD with bronchiectasis (last CTA 05/10/22) on 4L O2, tobacco use disorder, opioid use disorder, and MDR resistant pseudomonas PNA who presents with dyspnea.     # Acute COPD Exacerbation - Severe COPD and Bronchiectasis - Hx MDR Pseudomonal PNA  Patient with hx of hospitalizations for COPD exacerbations, most recently from 7/5-7/14 in which he grew MDR Achromobacter species. This exacerbation likely 2/2 pneumonia given patient's past history and current CXR findings with mid-lower left lung opacities. LRCx unremarkable.  - BiPAP PRN for pt sx relief  -consult pulmonology, appreciate recs   -Continue Cefepime 2g q8h for total of 7 days (thru 8/3).   - Levaquin 750 mg PO daily for total of 14 days (thru 8/9), will be discharged with remaining days of Levaquin PO.  -Continue Pulmicort 0.5 mg BID, to be also prescribed at discharge  -Continue DuoNeb Q6H, to be also prescribed at discharge for home regimen, albuterol Q4H PRN  -Continue prednisone 40 mg for total of 5 days, then can taper down to 30 mg for 3 days, 20 for 3 days, 10 for 3 days and 5 for 3 days then discontinue.  -Continue airway clearance with DuoNeb QID, hypertonic saline 10% QID, and Aerobika QID all of which should be prescribed at discharge and make sure he has them at home.  - needs 10L concentrator at home   -daily bmp, mg, vbg     # Opioid Use Disorder  - Continue home suboxone     #Iron Deficiency Anemia  Last colonoscopy in 08/09/2020, found one advanced adenomatous polyp. Anemia stable at baseline.  -Daily cbc   -Continue home iron supplementation     # Checklist:  - IVF None  - Tubes/Lines/Drains: PIV  - Diet Regular  - Bowel Regimen: Miralax PRN  - DVT: SQ Lovenox  - Code Status:   Orders Placed This Encounter   Procedures    DNR (Do Not Resuscitate) and DNI (Do Not Intubate)     Standing Status:   Standing     Number of Occurrences:   1     - Dispo: Floor    [ ]  Anticipated Discharge Location: Home likely with hospice - pt lives with daughter  [ ]  PT/OT/DME: No needs anticipated  [ ]  CM/SW needs: Home hospice (likely)  [ ]  Meds/Rx:  Not yet prescribed. No special med needs  [ ]  Teaching: None anticipated  [ ]  Follow up appt: Appointment needed  [ ]  Excuse letter: Needed  [ ]  Transport: Private Needed    SUBJECTIVE:  Interval events: Doing ok this am, ready to go home.       REVIEW OF SYSTEMS:  Pertinent positives and negatives per HPI. A complete review of systems otherwise negative.    PHYSICAL EXAM:      Intake/Output Summary (Last 24 hours) at 07/10/2022 0704  Last data filed at 07/10/2022 0500  Gross per 24 hour   Intake 3700 ml   Output 4050 ml   Net -350 ml         Recent Vitals:  Vitals:    07/10/22 0400   BP: 102/52   Pulse: 74   Resp: 15   Temp: 36.5 ??C (97.7 ??F)   SpO2: 100%  GEN: chronically ill appearing, sitting up in bed, NAD   HEENT: NCAT, No scleral icterus. Conjunctiva non-erythematous. MMM.  CV: Tachycardic rate and regular rhythm. No murmurs/rubs/gallops.  Pulm: Lungs with diffuse crackles, diffuse rhonchi, no wheezing.  Abd: Flat.  Nontender. Soft.  Neuro: A&O x 3. No focal deficits.  Ext: No peripheral edema.  Palpable distal pulses.  Skin: No rashes or skin lesions.   Psych: Normal mood/affect.      LABS/ STUDIES:    All imaging, laboratory studies, and other pertinent tests including electrocardiography within the last 24 hours were reviewed and are summarized within the assessment and plan.     NUTRITION:                           [name], MD,  Family Med PGY2

## 2022-07-11 MED ORDER — PREDNISONE 10 MG TABLET
ORAL_TABLET | Freq: Every day | ORAL | 0 refills | 3.00000 days | Status: CP
Start: 2022-07-11 — End: 2022-07-10
  Filled 2022-07-10: qty 14, 10d supply, fill #0

## 2022-07-11 MED ORDER — LEVOFLOXACIN 750 MG TABLET
ORAL_TABLET | ORAL | 0 refills | 5.00000 days | Status: CP
Start: 2022-07-11 — End: 2022-07-16
  Filled 2022-07-10: qty 5, 5d supply, fill #0

## 2022-07-12 DIAGNOSIS — J441 Chronic obstructive pulmonary disease with (acute) exacerbation: Principal | ICD-10-CM

## 2022-07-12 MED ORDER — PREDNISONE 20 MG TABLET
ORAL_TABLET | Freq: Every day | ORAL | 0 refills | 3 days | Status: CP
Start: 2022-07-12 — End: 2022-07-10

## 2022-07-12 NOTE — Unmapped (Unsigned)
1051 on call triage,  Marcelino Duster daughter # [phone], states patients is having trouble breathing, 02-88% at 6L via [place], caller states he administered neb treatment, triage advised Morphine 0.6ml and to sit patient up in bed, states she would like a nurse visit, Gearldine Bienenstock on call notified. Londen Lorge LVN

## 2022-07-13 DIAGNOSIS — J441 Chronic obstructive pulmonary disease with (acute) exacerbation: Principal | ICD-10-CM

## 2022-07-13 MED ORDER — MORPHINE ER 30 MG TABLET,EXTENDED RELEASE
ORAL_TABLET | Freq: Three times a day (TID) | ORAL | 0 refills | 4.00000 days | Status: CP
Start: 2022-07-13 — End: 2022-07-13

## 2022-07-13 NOTE — Unmapped (Unsigned)
2119 Triage Note:   Son in law Kristopher Obrien calling, call back number is [phone] stating that he gave patient Morphine 10mg  at 1800, 2000 and patient is complaining of pain and is stating it is too soon to give another dose of Morphine. Triage nurse reviewed medical record and Morphine is ordered every 2 hours. Caller stated patient is breathing fast, caller asking if patient can have Haldol, Triage nurse reviewed medications and advised may give Haldol 0.24ml at this time to help with anxiety and may give Morphine again as ordered at 2200. 2250 Call back to patient, son in law gave the Haldol as directed and gave Morphine as directed and patient is doing better and is patient is asleep.

## 2022-07-13 NOTE — Unmapped (Signed)
Caller reported that the nurse told him to call back for refill on Morphine if he gets down to .  Caller reports that the patient is taking the morphine every 2-4 hours.  Caller also had a lot of questions regarding medications, caller seems overwhelmed.  Provided listening, support, and education as needed. Luisa Hart Notified.

## 2022-07-14 DIAGNOSIS — J441 Chronic obstructive pulmonary disease with (acute) exacerbation: Principal | ICD-10-CM

## 2022-07-14 MED ORDER — PREDNISONE 10 MG TABLET
ORAL_TABLET | ORAL | 0 refills | 10 days | Status: CP
Start: 2022-07-14 — End: 2022-07-24

## 2022-07-14 NOTE — Unmapped (Addendum)
[facility]  Transition Note      Patient transitioned from [facility] to inpatient on 07/03/22      07/10/22-  Pt sent home w/ hospice from inpt .    Patient has completed case management services through the [facility].    Care Coordination Note updates in [facility]: Yes

## 2022-07-14 NOTE — Unmapped (Signed)
2000 on call triage , Marcelino Duster daughter #[phone], states she would like to discuss respite care for dad, stated family is very overwhelmed and needs a break, follow up needed. Suhailah Kwan LVN

## 2022-07-15 DIAGNOSIS — J441 Chronic obstructive pulmonary disease with (acute) exacerbation: Principal | ICD-10-CM

## 2022-07-15 DIAGNOSIS — J449 Chronic obstructive pulmonary disease, unspecified: Principal | ICD-10-CM

## 2022-07-15 MED ORDER — PREDNISONE 10 MG TABLET
ORAL_TABLET | Freq: Every day | ORAL | 0 refills | 3 days | Status: CP
Start: 2022-07-15 — End: 2022-07-10

## 2022-07-15 MED ORDER — MORPHINE CONCENTRATE 100 MG/5 ML (20 MG/ML) ORAL SOLUTION
ORAL | 0 refills | 5 days | Status: CP | PRN
Start: 2022-07-15 — End: ?

## 2022-07-18 MED ORDER — PREDNISONE 5 MG TABLET
ORAL_TABLET | Freq: Every day | ORAL | 0 refills | 3 days | Status: CP
Start: 2022-07-18 — End: 2022-07-10

## 2022-08-08 DEATH — deceased

## 2022-08-18 NOTE — Unmapped (Signed)
Specialty Medication(s): [place]    Mr.Peragine has been dis-enrolled from the [facility] specialty pharmacy services due to patient is deceased.    Additional information provided to the patient: n/a    Clydell Hakim  [facility] Specialty Pharmacist
